# Patient Record
Sex: Male | Born: 1950 | Race: White | Hispanic: No | Marital: Married | Smoking: Former smoker
Health system: Southern US, Community
[De-identification: ages and names within clinical notes are randomized; demographics above are authoritative.]

## PROBLEM LIST (undated history)

## (undated) DIAGNOSIS — M21371 Foot drop, right foot: Secondary | ICD-10-CM

## (undated) DIAGNOSIS — Z992 Dependence on renal dialysis: Secondary | ICD-10-CM

## (undated) DIAGNOSIS — J90 Pleural effusion, not elsewhere classified: Secondary | ICD-10-CM

## (undated) DIAGNOSIS — R001 Bradycardia, unspecified: Secondary | ICD-10-CM

## (undated) DIAGNOSIS — D638 Anemia in other chronic diseases classified elsewhere: Secondary | ICD-10-CM

## (undated) DIAGNOSIS — I219 Acute myocardial infarction, unspecified: Secondary | ICD-10-CM

## (undated) DIAGNOSIS — H35053 Retinal neovascularization, unspecified, bilateral: Secondary | ICD-10-CM

## (undated) DIAGNOSIS — I5022 Chronic systolic (congestive) heart failure: Secondary | ICD-10-CM

## (undated) DIAGNOSIS — I255 Ischemic cardiomyopathy: Secondary | ICD-10-CM

## (undated) DIAGNOSIS — N186 End stage renal disease: Secondary | ICD-10-CM

## (undated) DIAGNOSIS — I2510 Atherosclerotic heart disease of native coronary artery without angina pectoris: Secondary | ICD-10-CM

## (undated) DIAGNOSIS — I10 Essential (primary) hypertension: Secondary | ICD-10-CM

## (undated) DIAGNOSIS — M21372 Foot drop, left foot: Secondary | ICD-10-CM

## (undated) DIAGNOSIS — E1142 Type 2 diabetes mellitus with diabetic polyneuropathy: Principal | ICD-10-CM

## (undated) DIAGNOSIS — R269 Unspecified abnormalities of gait and mobility: Secondary | ICD-10-CM

## (undated) DIAGNOSIS — G6181 Chronic inflammatory demyelinating polyneuritis: Secondary | ICD-10-CM

## (undated) DIAGNOSIS — E785 Hyperlipidemia, unspecified: Secondary | ICD-10-CM

## (undated) HISTORY — DX: Anemia in other chronic diseases classified elsewhere: D63.8

## (undated) HISTORY — DX: Unspecified abnormalities of gait and mobility: R26.9

## (undated) HISTORY — DX: Hypomagnesemia: E83.42

## (undated) HISTORY — DX: Dependence on renal dialysis: Z99.2

## (undated) HISTORY — DX: Chronic systolic (congestive) heart failure: I50.22

## (undated) HISTORY — DX: Atherosclerotic heart disease of native coronary artery without angina pectoris: I25.10

## (undated) HISTORY — DX: Essential (primary) hypertension: I10

## (undated) HISTORY — DX: Ischemic cardiomyopathy: I25.5

## (undated) HISTORY — DX: End stage renal disease: Z99.2

## (undated) HISTORY — DX: Chronic inflammatory demyelinating polyneuritis: G61.81

## (undated) HISTORY — DX: End stage renal disease: N18.6

## (undated) HISTORY — DX: Foot drop, right foot: M21.371

## (undated) HISTORY — DX: Hyperlipidemia, unspecified: E78.5

## (undated) HISTORY — DX: Type 2 diabetes mellitus with diabetic polyneuropathy: E11.42

## (undated) HISTORY — DX: Foot drop, left foot: M21.372

## (undated) HISTORY — DX: Bradycardia, unspecified: R00.1

## (undated) HISTORY — PX: TONSILLECTOMY: SUR1361

## (undated) HISTORY — DX: Retinal neovascularization, unspecified, bilateral: H35.053

## (undated) HISTORY — DX: Acute myocardial infarction, unspecified: I21.9

## (undated) HISTORY — PX: LITHOTRIPSY: SUR834

## (undated) HISTORY — DX: Pleural effusion, not elsewhere classified: J90

---

## 1993-08-16 DIAGNOSIS — I219 Acute myocardial infarction, unspecified: Secondary | ICD-10-CM

## 1993-08-16 HISTORY — DX: Acute myocardial infarction, unspecified: I21.9

## 1993-08-16 HISTORY — PX: CARDIAC CATHETERIZATION: SHX172

## 1993-08-16 HISTORY — PX: CORONARY ANGIOPLASTY WITH STENT PLACEMENT: SHX49

## 1998-07-14 ENCOUNTER — Ambulatory Visit (HOSPITAL_COMMUNITY): Admission: RE | Admit: 1998-07-14 | Discharge: 1998-07-14 | Payer: Self-pay | Admitting: Cardiology

## 1998-07-14 ENCOUNTER — Encounter: Payer: Self-pay | Admitting: Cardiology

## 1999-01-13 ENCOUNTER — Observation Stay (HOSPITAL_COMMUNITY): Admission: AD | Admit: 1999-01-13 | Discharge: 1999-01-14 | Payer: Self-pay | Admitting: Cardiology

## 1999-08-14 ENCOUNTER — Ambulatory Visit (HOSPITAL_COMMUNITY): Admission: RE | Admit: 1999-08-14 | Discharge: 1999-08-15 | Payer: Self-pay | Admitting: Cardiology

## 2000-01-29 ENCOUNTER — Ambulatory Visit (HOSPITAL_COMMUNITY): Admission: RE | Admit: 2000-01-29 | Discharge: 2000-01-29 | Payer: Self-pay | Admitting: Gastroenterology

## 2000-03-22 ENCOUNTER — Ambulatory Visit (HOSPITAL_COMMUNITY): Admission: RE | Admit: 2000-03-22 | Discharge: 2000-03-23 | Payer: Self-pay | Admitting: Cardiology

## 2001-03-22 ENCOUNTER — Encounter: Payer: Self-pay | Admitting: Cardiology

## 2001-03-22 ENCOUNTER — Ambulatory Visit (HOSPITAL_COMMUNITY): Admission: RE | Admit: 2001-03-22 | Discharge: 2001-03-22 | Payer: Self-pay | Admitting: Cardiology

## 2001-07-29 ENCOUNTER — Encounter: Admission: RE | Admit: 2001-07-29 | Discharge: 2001-07-29 | Payer: Self-pay | Admitting: Family Medicine

## 2001-07-29 ENCOUNTER — Encounter: Payer: Self-pay | Admitting: Family Medicine

## 2001-08-03 ENCOUNTER — Encounter: Payer: Self-pay | Admitting: Family Medicine

## 2001-08-03 ENCOUNTER — Ambulatory Visit (HOSPITAL_COMMUNITY): Admission: RE | Admit: 2001-08-03 | Discharge: 2001-08-03 | Payer: Self-pay | Admitting: Family Medicine

## 2002-03-23 ENCOUNTER — Encounter: Payer: Self-pay | Admitting: Cardiology

## 2002-03-23 ENCOUNTER — Ambulatory Visit (HOSPITAL_COMMUNITY): Admission: RE | Admit: 2002-03-23 | Discharge: 2002-03-23 | Payer: Self-pay | Admitting: Cardiology

## 2002-09-04 ENCOUNTER — Inpatient Hospital Stay (HOSPITAL_COMMUNITY): Admission: EM | Admit: 2002-09-04 | Discharge: 2002-09-11 | Payer: Self-pay | Admitting: Internal Medicine

## 2002-09-05 ENCOUNTER — Encounter: Payer: Self-pay | Admitting: Pulmonary Disease

## 2002-09-09 ENCOUNTER — Encounter: Payer: Self-pay | Admitting: Pulmonary Disease

## 2002-09-28 ENCOUNTER — Encounter
Admission: RE | Admit: 2002-09-28 | Discharge: 2002-09-28 | Payer: Self-pay | Admitting: Thoracic Surgery (Cardiothoracic Vascular Surgery)

## 2002-09-28 ENCOUNTER — Encounter: Payer: Self-pay | Admitting: Thoracic Surgery (Cardiothoracic Vascular Surgery)

## 2003-02-01 ENCOUNTER — Encounter: Payer: Self-pay | Admitting: Family Medicine

## 2003-02-01 ENCOUNTER — Encounter: Admission: RE | Admit: 2003-02-01 | Discharge: 2003-02-01 | Payer: Self-pay | Admitting: Family Medicine

## 2003-03-11 ENCOUNTER — Encounter (HOSPITAL_COMMUNITY): Admission: RE | Admit: 2003-03-11 | Discharge: 2003-06-09 | Payer: Self-pay | Admitting: Neurology

## 2003-06-12 ENCOUNTER — Encounter (HOSPITAL_COMMUNITY): Admission: RE | Admit: 2003-06-12 | Discharge: 2003-09-10 | Payer: Self-pay | Admitting: Neurology

## 2003-09-18 ENCOUNTER — Encounter (HOSPITAL_COMMUNITY): Admission: RE | Admit: 2003-09-18 | Discharge: 2003-12-17 | Payer: Self-pay | Admitting: Neurology

## 2003-12-17 ENCOUNTER — Encounter (HOSPITAL_COMMUNITY): Admission: RE | Admit: 2003-12-17 | Discharge: 2004-03-16 | Payer: Self-pay | Admitting: Neurology

## 2005-04-23 ENCOUNTER — Ambulatory Visit (HOSPITAL_COMMUNITY): Admission: RE | Admit: 2005-04-23 | Discharge: 2005-04-23 | Payer: Self-pay | Admitting: Cardiology

## 2005-04-23 ENCOUNTER — Encounter: Payer: Self-pay | Admitting: Cardiovascular Disease

## 2005-06-24 ENCOUNTER — Ambulatory Visit: Payer: Self-pay | Admitting: Family Medicine

## 2005-06-29 ENCOUNTER — Ambulatory Visit: Payer: Self-pay | Admitting: Family Medicine

## 2008-08-28 ENCOUNTER — Encounter: Payer: Self-pay | Admitting: Cardiovascular Disease

## 2008-09-13 ENCOUNTER — Encounter: Payer: Self-pay | Admitting: Cardiovascular Disease

## 2010-10-06 ENCOUNTER — Encounter: Payer: Self-pay | Admitting: Cardiovascular Disease

## 2010-10-06 ENCOUNTER — Inpatient Hospital Stay: Payer: PRIVATE HEALTH INSURANCE | Admitting: Internal Medicine

## 2010-10-14 ENCOUNTER — Ambulatory Visit: Payer: PRIVATE HEALTH INSURANCE | Admitting: Family Medicine

## 2010-10-17 ENCOUNTER — Encounter: Payer: Self-pay | Admitting: Cardiovascular Disease

## 2010-10-17 ENCOUNTER — Observation Stay: Payer: PRIVATE HEALTH INSURANCE | Admitting: Internal Medicine

## 2010-11-03 ENCOUNTER — Encounter: Payer: Self-pay | Admitting: Cardiovascular Disease

## 2010-11-11 ENCOUNTER — Ambulatory Visit: Payer: PRIVATE HEALTH INSURANCE | Admitting: Family Medicine

## 2010-11-23 ENCOUNTER — Ambulatory Visit: Payer: PRIVATE HEALTH INSURANCE | Admitting: Vascular Surgery

## 2010-12-22 ENCOUNTER — Encounter: Payer: Self-pay | Admitting: *Deleted

## 2010-12-22 ENCOUNTER — Ambulatory Visit (INDEPENDENT_AMBULATORY_CARE_PROVIDER_SITE_OTHER): Payer: Medicare Other | Admitting: Cardiovascular Disease

## 2010-12-22 ENCOUNTER — Ambulatory Visit: Payer: Self-pay | Admitting: Cardiovascular Disease

## 2010-12-22 ENCOUNTER — Encounter: Payer: Self-pay | Admitting: Cardiovascular Disease

## 2010-12-22 DIAGNOSIS — E119 Type 2 diabetes mellitus without complications: Secondary | ICD-10-CM

## 2010-12-22 DIAGNOSIS — E785 Hyperlipidemia, unspecified: Secondary | ICD-10-CM | POA: Insufficient documentation

## 2010-12-22 DIAGNOSIS — I10 Essential (primary) hypertension: Secondary | ICD-10-CM

## 2010-12-22 DIAGNOSIS — I2510 Atherosclerotic heart disease of native coronary artery without angina pectoris: Secondary | ICD-10-CM

## 2010-12-22 DIAGNOSIS — E1129 Type 2 diabetes mellitus with other diabetic kidney complication: Secondary | ICD-10-CM | POA: Insufficient documentation

## 2010-12-22 DIAGNOSIS — R0602 Shortness of breath: Secondary | ICD-10-CM

## 2010-12-22 NOTE — Assessment & Plan Note (Signed)
Etiology of his shortness of breath is uncertain. We have ordered a stress test.

## 2010-12-22 NOTE — Assessment & Plan Note (Signed)
He has had recent weight loss which would likely help his underlying diabetes. We have suggested he try to hold his weight at its current level.

## 2010-12-22 NOTE — Progress Notes (Signed)
   Patient ID: Daniel Anthony, male    DOB: 10/12/1950, 60 y.o.   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  Overall, he reports that he feels stable. He does have increasing shortness of breath over the past month that he feels is more than just deconditioning. He is interested in having a stress test to confirm that his stents are patent. At baseline he does not walk very far and when he does, he has significant leg weakness. He is not able to treadmill secondary to his underlying neurologic disease.  He does report that his weight has decreased significantly over the past several weeks. Because of this, his blood pressure has significantly improved. Typically his blood pressure is very elevated normal recently on his current regiment, it has been well controlled.  He was admitted to [facility] twice this year for medication titration and blood pressure control.  EKG shows normal sinus rhythm with rate 71 beats per minute with T wave abnormality in leads V3 through V6, one and aVL     Review of Systems  Constitutional: Negative.   HENT: Negative.   Eyes: Negative.   Respiratory: Positive for shortness of breath.   Cardiovascular: Negative.   Gastrointestinal: Negative.   Musculoskeletal:       Profound lower extremity muscle weakness, muscle atrophy in his hands  Skin: Negative.   Neurological: Negative.   Hematological: Negative.   Psychiatric/Behavioral: Negative.   All other  systems reviewed and are negative.    BP 118/64  Pulse 71  Ht 5\' 8"  (1.727 m)  Wt 170 lb (77.111 kg)  BMI 25.85 kg/m2   Physical Exam  Nursing note and vitals reviewed. Constitutional: He is oriented to person, place, and time. He appears well-developed and well-nourished.       She does have signs of muscle wasting thickening in his hands, thin legs  HENT:  Head: Normocephalic.  Nose: Nose normal.  Mouth/Throat: Oropharynx is clear and moist.  Eyes: Conjunctivae are normal. Pupils are equal, round, and reactive to light.  Neck: Normal range of motion. Neck supple. No JVD present.  Cardiovascular: Normal rate, regular rhythm, S1 normal, S2 normal, normal heart sounds and intact distal pulses.  Exam reveals no gallop and no friction rub.   No murmur heard. Pulmonary/Chest: Effort normal and breath sounds normal. No respiratory distress. He has no wheezes. He has no rales. He exhibits no tenderness.  Abdominal: Soft. Bowel sounds are normal. He exhibits no distension. There is no tenderness.  Musculoskeletal: Normal range of motion. He exhibits no edema and no tenderness.  Lymphadenopathy:    He has no cervical adenopathy.  Neurological: He is alert and oriented to person, place, and time. Coordination normal.  Skin: Skin is warm and dry. No rash noted. No erythema.  Psychiatric: He has a normal mood and affect. His behavior is normal. Judgment and thought content normal.           Assessment and Plan

## 2010-12-22 NOTE — Assessment & Plan Note (Signed)
History of significant coronary artery disease. He does have worsening shortness of breath he is concerned about progression of his disease. We will set him up for a lexiscan Myoview.

## 2010-12-22 NOTE — Assessment & Plan Note (Signed)
Cholesterol is at goal on the current lipid regimen. No changes to the medications were made.  

## 2010-12-22 NOTE — Patient Instructions (Addendum)
You are doing well. Please monitor your blood pressure and cut the hydralazine in 1/2 if SBP is less than 120. Please call us if you have new issues that need to be addressed before your next appt.  We will call you for a follow up Appt. In 6 months You have been scheduled for a myoview at [facility]. We will call you with results.

## 2010-12-22 NOTE — Assessment & Plan Note (Signed)
Blood pressure is excellent and even running a little bit low. We have suggested that he monitor his pressure and if it does run low, he cut his hydralazine in half to 25 mg q.i.d..

## 2011-01-01 ENCOUNTER — Telehealth: Payer: Self-pay | Admitting: *Deleted

## 2011-01-01 ENCOUNTER — Ambulatory Visit: Payer: PRIVATE HEALTH INSURANCE | Admitting: Cardiovascular Disease

## 2011-01-01 DIAGNOSIS — R079 Chest pain, unspecified: Secondary | ICD-10-CM

## 2011-01-01 NOTE — Telephone Encounter (Signed)
Attempted to call pt with results of lexiscan. Per Dr. Mariah Milling, overall normal test, possible inferior wall artifact/ or thinning of wall/ wall motion good though. Will attempt to call pt at later time.

## 2011-01-04 NOTE — Telephone Encounter (Signed)
Spoke to Daniel Anthony, notified him of results below. Daniel Anthony did want Dr. Mariah Milling to know that his DOE has increased, but Dr. Sullivan Lone will be coming by his house to assess, and he will f/u with Dr. Sullivan Lone for that, just FYI.

## 2011-01-22 ENCOUNTER — Ambulatory Visit: Payer: PRIVATE HEALTH INSURANCE | Admitting: Family Medicine

## 2011-01-25 ENCOUNTER — Encounter: Payer: Self-pay | Admitting: Cardiovascular Disease

## 2011-03-04 ENCOUNTER — Encounter: Payer: Self-pay | Admitting: Cardiovascular Disease

## 2011-04-02 ENCOUNTER — Ambulatory Visit: Payer: PRIVATE HEALTH INSURANCE | Admitting: Family Medicine

## 2011-08-27 ENCOUNTER — Encounter: Payer: Self-pay | Admitting: Cardiovascular Disease

## 2011-08-27 ENCOUNTER — Ambulatory Visit (INDEPENDENT_AMBULATORY_CARE_PROVIDER_SITE_OTHER): Payer: Medicare Other | Admitting: Cardiovascular Disease

## 2011-08-27 VITALS — BP 124/62 | HR 63 | Ht 68.0 in | Wt 181.8 lb

## 2011-08-27 DIAGNOSIS — I2510 Atherosclerotic heart disease of native coronary artery without angina pectoris: Secondary | ICD-10-CM

## 2011-08-27 DIAGNOSIS — E785 Hyperlipidemia, unspecified: Secondary | ICD-10-CM

## 2011-08-27 DIAGNOSIS — R0602 Shortness of breath: Secondary | ICD-10-CM

## 2011-08-27 DIAGNOSIS — I10 Essential (primary) hypertension: Secondary | ICD-10-CM

## 2011-08-27 NOTE — Progress Notes (Signed)
Patient ID: Daniel Anthony, male    DOB: 1951/05/10, 61 y.o.   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 Stress test was done after his last clinic visit that showed no significant ischemia.  He reports that overall he has been doing well. He has been seen by Dr. Otho Bellows in [place] for renal dysfunction. He was started on Lasix now taking 80 mg daily. He reports that his creatinine climbed and is now around 2.3.  His carvedilol was recently increased to 25 mg b.i.d.. With his current medications, he reports his blood pressure is well-controlled.  He does report occasional episodes of bilateral neck pain. One episode occurred after chasing his dog before Christmas.  He is not very active and has no regular activity or exercise. He does have some shortness of breath with moving the trash to the curb  Echocardiogram February 2012 was essentially normal. There was no mention on whether there was diastolic dysfunction. Study was read by the outside cardiologist  EKG shows normal sinus rhythm with rate 63 beats per minute with no Significant ST or T wave changes   Outpatient Encounter Prescriptions as of 08/27/2011  Medication Sig Dispense Refill  . amLODipine (NORVASC) 5 MG tablet Take 5 mg by mouth daily.        Marland Kitchen aspirin 325 MG EC tablet Take 325 mg by mouth daily.        Marland Kitchen atorvastatin (LIPITOR) 40 MG tablet Take 40 mg by mouth daily.        . carvedilol (COREG) 25 MG tablet Take 25 mg by mouth 2 (two)  times daily with a meal.      . docusate sodium (COLACE) 100 MG capsule Take 100 mg by mouth 2 (two) times daily.        Marland Kitchen escitalopram (LEXAPRO) 10 MG tablet Take 10 mg by mouth daily.        . fentaNYL (DURAGESIC - DOSED MCG/HR) 100 MCG/HR Place 1 patch onto the skin every 3 (three) days.        . furosemide (LASIX) 80 MG tablet Take 80 mg by mouth daily.      . hydrALAZINE (APRESOLINE) 50 MG tablet Take 50 mg by mouth 2 (two) times daily.       . insulin detemir (LEVEMIR) 100 UNIT/ML injection Inject 20 Units into the skin at bedtime.        . insulin lispro protamine-insulin lispro (HUMALOG 75/25) (75-25) 100 UNIT/ML SUSP Inject 30 Units into the skin daily with breakfast.        . sitaGLIPtan (JANUVIA) 100 MG tablet Take 100 mg by mouth daily.       Marland Kitchen telmisartan (MICARDIS) 80 MG tablet Take 80 mg by mouth daily.        Review of Systems  Constitutional: Negative.   HENT: Negative.   Eyes: Negative.   Respiratory: Positive for shortness of breath.   Cardiovascular: Negative.        Bilateral neck pain  Gastrointestinal: Negative.   Musculoskeletal:  Profound lower extremity muscle weakness, muscle atrophy in his hands  Skin: Negative.   Neurological: Negative.   Hematological: Negative.   Psychiatric/Behavioral: Negative.   All other systems reviewed and are negative.    BP 124/62  Pulse 63  Ht 5\' 8"  (1.727 m)  Wt 181 lb 12 oz (82.441 kg)  BMI 27.63 kg/m2   Physical Exam  Nursing note and vitals reviewed. Constitutional: He is oriented to person, place, and time. He appears well-developed and well-nourished.       She does have signs of muscle wasting thickening in his hands, thin legs  HENT:  Head: Normocephalic.  Nose: Nose normal.  Mouth/Throat: Oropharynx is clear and moist.  Eyes: Conjunctivae are normal. Pupils are equal, round, and reactive to light.  Neck: Normal range of motion. Neck supple. No JVD present.  Cardiovascular: Normal rate, regular  rhythm, S1 normal, S2 normal, normal heart sounds and intact distal pulses.  Exam reveals no gallop and no friction rub.   No murmur heard. Pulmonary/Chest: Effort normal and breath sounds normal. No respiratory distress. He has no wheezes. He has no rales. He exhibits no tenderness.  Abdominal: Soft. Bowel sounds are normal. He exhibits no distension. There is no tenderness.  Musculoskeletal: Normal range of motion. He exhibits no edema and no tenderness.  Lymphadenopathy:    He has no cervical adenopathy.  Neurological: He is alert and oriented to person, place, and time. Coordination normal.  Skin: Skin is warm and dry. No rash noted. No erythema.  Psychiatric: He has a normal mood and affect. His behavior is normal. Judgment and thought content normal.           Assessment and Plan

## 2011-08-27 NOTE — Patient Instructions (Signed)
You are doing well. No medication changes were made.  Please call us if you have new issues that need to be addressed before your next appt.  Your physician wants you to follow-up in: 6 months.  You will receive a reminder letter in the mail two months in advance. If you don't receive a letter, please call our office to schedule the follow-up appointment.   

## 2011-08-27 NOTE — Assessment & Plan Note (Signed)
Blood pressure is well controlled on today's visit. He has had a climbing his creatinine with high-dose Lasix. I suggested he consider cutting back on his Lasix dosing. Latest creatinine 2.3

## 2011-08-27 NOTE — Assessment & Plan Note (Signed)
Cholesterol is at goal on the current lipid regimen. No changes to the medications were made.  

## 2011-08-27 NOTE — Assessment & Plan Note (Signed)
I suspect his shortness of breath could be secondary to deconditioning. Unable to exclude underlying cardiac ischemia. If his symptoms get worse, we would perform a catheterization. Echocardiogram did not mention diastolic dysfunction in 2012

## 2011-08-27 NOTE — Assessment & Plan Note (Signed)
Recent rare episodes of bilateral neck pain with exertion. He is uncertain if this is cardiac or some other etiology. We will plan to proceed monitor him and he will call if he has additional or worsening symptoms. If this happens, we would consider a cardiac catheterization.

## 2011-11-11 ENCOUNTER — Ambulatory Visit: Payer: Self-pay | Admitting: Family Medicine

## 2011-12-17 ENCOUNTER — Encounter: Payer: Self-pay | Admitting: *Deleted

## 2011-12-23 ENCOUNTER — Ambulatory Visit (INDEPENDENT_AMBULATORY_CARE_PROVIDER_SITE_OTHER): Payer: Medicare Other | Admitting: Cardiovascular Disease

## 2011-12-23 ENCOUNTER — Telehealth: Payer: Self-pay | Admitting: Cardiovascular Disease

## 2011-12-23 ENCOUNTER — Encounter: Payer: Self-pay | Admitting: Cardiovascular Disease

## 2011-12-23 VITALS — BP 124/64 | HR 61 | Ht 68.0 in | Wt 185.5 lb

## 2011-12-23 DIAGNOSIS — E119 Type 2 diabetes mellitus without complications: Secondary | ICD-10-CM

## 2011-12-23 DIAGNOSIS — R29898 Other symptoms and signs involving the musculoskeletal system: Secondary | ICD-10-CM

## 2011-12-23 DIAGNOSIS — E785 Hyperlipidemia, unspecified: Secondary | ICD-10-CM

## 2011-12-23 DIAGNOSIS — R0989 Other specified symptoms and signs involving the circulatory and respiratory systems: Secondary | ICD-10-CM

## 2011-12-23 DIAGNOSIS — I2510 Atherosclerotic heart disease of native coronary artery without angina pectoris: Secondary | ICD-10-CM

## 2011-12-23 DIAGNOSIS — R0602 Shortness of breath: Secondary | ICD-10-CM

## 2011-12-23 DIAGNOSIS — R6889 Other general symptoms and signs: Secondary | ICD-10-CM

## 2011-12-23 DIAGNOSIS — M5382 Other specified dorsopathies, cervical region: Secondary | ICD-10-CM

## 2011-12-23 DIAGNOSIS — I10 Essential (primary) hypertension: Secondary | ICD-10-CM

## 2011-12-23 MED ORDER — NITROGLYCERIN 0.4 MG SL SUBL: 0.4000 mg | SUBLINGUAL_TABLET | SUBLINGUAL | Status: DC | PRN

## 2011-12-23 NOTE — Progress Notes (Signed)
Patient ID: Daniel Anthony, male    DOB: 01/11/1951, 61 y.o.   MRN: [id number]  HPI Comments: 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 Stress test was done early 2012 that showed no significant ischemia.  He reports that overall he has been doing well. He has been seen by Dr. Otho Bellows in [place] for renal dysfunction. Creatinine has improved, now less than 2. blood pressure is well-controlled.  On previous office visit, He reported occasional episodes of bilateral neck pain. He is not very active and has no regular activity or exercise. He reports having some tightness in his throat recently when moving trash cans. This occurred several weeks ago, 4-5 episodes during that week. It has not happened since then. He is concerned about worsening  coronary artery disease.  Echocardiogram February 2012 was essentially normal. There was no mention on whether there was diastolic dysfunction. Study was read by the outside cardiologist  EKG shows normal sinus rhythm with rate 63 beats per minute with T-wave abnormality in V6, aVL  Outpatient Encounter Prescriptions as of 12/23/2011  Medication Sig Dispense Refill  . amLODipine (NORVASC) 5 MG tablet Take 5 mg by mouth daily.        Marland Kitchen aspirin 325 MG EC tablet Take 325 mg by mouth daily.        Marland Kitchen atorvastatin (LIPITOR) 40 MG tablet Take 40 mg by mouth daily.        . Calcium Citrate (CITRACAL PO) Take 0.25 mg by mouth daily. Take one daily  alternating with two daily every other day.      . carvedilol (COREG) 25 MG tablet Take 12.5 mg by mouth. Taking one and 1/2 two times daily.      Marland Kitchen docusate sodium (COLACE) 100 MG capsule Take 250 mg by mouth daily.       Marland Kitchen escitalopram (LEXAPRO) 10 MG tablet Take 10 mg by mouth daily.        . fentaNYL (DURAGESIC - DOSED MCG/HR) 100 MCG/HR Place 1 patch onto the skin every 3 (three) days.        . furosemide (LASIX) 80 MG tablet Take 80 mg by mouth daily.      . hydrALAZINE (APRESOLINE) 50 MG tablet Take 50 mg by mouth 2 (two) times daily.       . insulin detemir (LEVEMIR) 100 UNIT/ML injection Inject 50 Units into the skin at bedtime.       Marland Kitchen telmisartan (MICARDIS) 80 MG tablet Take 80 mg by mouth daily.      . vitamin B-12 (CYANOCOBALAMIN) 100 MCG tablet Take 50 mcg by mouth every 30 (thirty) days.      . nitroGLYCERIN (NITROSTAT) 0.4 MG SL tablet Place 1 tablet (0.4 mg total) under the tongue every 5 (five) minutes as needed for chest pain.  25 tablet  3   Review of Systems  Constitutional: Negative.   HENT: Negative.   Eyes: Negative.   Respiratory: Positive for shortness of breath.   Cardiovascular: Negative.  Throat tightness  Gastrointestinal: Negative.   Musculoskeletal:       Profound lower extremity muscle weakness, muscle atrophy in his hands  Skin: Negative.   Neurological: Negative.   Hematological: Negative.   Psychiatric/Behavioral: Negative.   All other systems reviewed and are negative.    BP 124/64  Pulse 61  Ht 5\' 8"  (1.727 m)  Wt 185 lb 8 oz (84.142 kg)  BMI 28.21 kg/m2  Physical Exam  Nursing note and vitals reviewed. Constitutional: He is oriented to person, place, and time. He appears well-developed and well-nourished.        muscle wasting thickening in his hands, thin legs  HENT:  Head: Normocephalic.  Nose: Nose normal.  Mouth/Throat: Oropharynx is clear and moist.  Eyes: Conjunctivae are normal. Pupils are equal, round, and reactive to  light.  Neck: Normal range of motion. Neck supple. No JVD present.  Cardiovascular: Normal rate, regular rhythm, S1 normal, S2 normal, normal heart sounds and intact distal pulses.  Exam reveals no gallop and no friction rub.   No murmur heard. Pulmonary/Chest: Effort normal and breath sounds normal. No respiratory distress. He has no wheezes. He has no rales. He exhibits no tenderness.  Abdominal: Soft. Bowel sounds are normal. He exhibits no distension. There is no tenderness.  Musculoskeletal: Normal range of motion. He exhibits no edema and no tenderness.  Lymphadenopathy:    He has no cervical adenopathy.  Neurological: He is alert and oriented to person, place, and time. Coordination normal.  Skin: Skin is warm and dry. No rash noted. No erythema.  Psychiatric: He has a normal mood and affect. His behavior is normal. Judgment and thought content normal.           Assessment and Plan

## 2011-12-23 NOTE — Assessment & Plan Note (Signed)
We have encouraged continued exercise, careful diet management in an effort to lose weight. 

## 2011-12-23 NOTE — Assessment & Plan Note (Signed)
Recent possible angina, uncertain. He did have chest pain in the past now with throat tightness symptoms that have resolved. We have suggested he call us for any further symptoms and we would proceed with stress testing.

## 2011-12-23 NOTE — Assessment & Plan Note (Signed)
Etiology of his episodes of throat tightness is uncertain. He has had neck pain in the past. No recent symptoms over the past several weeks. Unable to exclude angina. No significant EKG changes. We have discussed the options with him including Myoview or Monitoring his symptoms. He will call us if he starts to have symptoms again and we would order a pharmacologic Myoview. We have renewed his nitroglycerin.

## 2011-12-23 NOTE — Patient Instructions (Signed)
You are doing well. No medication changes were made.  Please call the office for any further throat or chest symptoms We would order a chemical stress test  Please call us if you have new issues that need to be addressed before your next appt.  Your physician wants you to follow-up in: 6 months.  You will receive a reminder letter in the mail two months in advance. If you don't receive a letter, please call our office to schedule the follow-up appointment.

## 2011-12-23 NOTE — Telephone Encounter (Signed)
Pt was given script for nitro and pharmacy called due to pt getting lavitra from another provider and wanted to make sure this is ok.

## 2011-12-23 NOTE — Telephone Encounter (Signed)
Please advise. I can tell patient not to take NTG when taking the levitra or what do you recommend?

## 2011-12-23 NOTE — Assessment & Plan Note (Signed)
He reports LDL in the 70s. We'll try to obtain most recent cholesterol for our records.

## 2011-12-23 NOTE — Assessment & Plan Note (Signed)
Chronic issue, mild. History of smoking, deconditioned also an underlying neurologic issue.

## 2011-12-23 NOTE — Assessment & Plan Note (Signed)
Blood pressure is well controlled on today's visit. No changes made to the medications. 

## 2011-12-23 NOTE — Telephone Encounter (Signed)
Would recommended if he takes nitroglycerin consistently, that he hold the Levitra

## 2011-12-24 NOTE — Telephone Encounter (Signed)
NA x 1

## 2011-12-24 NOTE — Telephone Encounter (Signed)
Will forward to Vincent. I tried to call the patient several times with Dr. Windell Hummingbird recommendations. I keep getting a fast busy signal at his home #.

## 2011-12-31 NOTE — Telephone Encounter (Signed)
Patient not available; will send a letter regarding this message.

## 2012-01-03 NOTE — Telephone Encounter (Signed)
Letter sent on Jan 03, 2012.

## 2012-01-28 ENCOUNTER — Other Ambulatory Visit: Payer: Self-pay | Admitting: Gastroenterology

## 2012-05-15 ENCOUNTER — Ambulatory Visit: Payer: Self-pay | Admitting: Family Medicine

## 2012-06-20 ENCOUNTER — Ambulatory Visit (INDEPENDENT_AMBULATORY_CARE_PROVIDER_SITE_OTHER): Payer: Medicare Other | Admitting: Cardiovascular Disease

## 2012-06-20 ENCOUNTER — Encounter: Payer: Self-pay | Admitting: Cardiovascular Disease

## 2012-06-20 VITALS — BP 152/78 | HR 72 | Ht 68.0 in | Wt 180.2 lb

## 2012-06-20 DIAGNOSIS — E119 Type 2 diabetes mellitus without complications: Secondary | ICD-10-CM

## 2012-06-20 DIAGNOSIS — I10 Essential (primary) hypertension: Secondary | ICD-10-CM

## 2012-06-20 DIAGNOSIS — I2510 Atherosclerotic heart disease of native coronary artery without angina pectoris: Secondary | ICD-10-CM

## 2012-06-20 DIAGNOSIS — E785 Hyperlipidemia, unspecified: Secondary | ICD-10-CM

## 2012-06-20 MED ORDER — EZETIMIBE 10 MG PO TABS: 10.0000 mg | ORAL_TABLET | Freq: Every day | ORAL | Status: DC

## 2012-06-20 MED ORDER — ROSUVASTATIN CALCIUM 40 MG PO TABS: 40.0000 mg | ORAL_TABLET | Freq: Every day | ORAL | Status: DC

## 2012-06-20 NOTE — Assessment & Plan Note (Signed)
We have encouraged continued exercise, careful diet management in an effort to lose weight. 

## 2012-06-20 NOTE — Patient Instructions (Addendum)
You are doing well. Start zetia one pill a day Consider changing lipitor to crestor one a day  Talk with Renal about blood pressure meds: Imdur/isosorbide, long acting nitro Hydralazine 100 mg pills Doxazosin evening Extra lasix, or add HCTZ BIG ?   Add ACE inh   Please call us if you have new issues that need to be addressed before your next appt.  Your physician wants you to follow-up in: 6 months.  You will receive a reminder letter in the mail two months in advance. If you don't receive a letter, please call our office to schedule the follow-up appointment.

## 2012-06-20 NOTE — Progress Notes (Signed)
Patient ID: Daniel Anthony, male    DOB: [birth year], 61 y.o.   MRN: [id number]  HPI Comments: 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 Stress test was done early 2012 that showed no significant ischemia.  He reports that overall he has been doing well. He sees Dr. Otho Bellows in [place] for renal dysfunction.  Blood pressure has been running high at home typically in the 150-170 range systolic.   He is not very active and has no regular activity or exercise. No significant chest pain or worsening of his shortness of breath  Echocardiogram February 2012 was essentially normal. There was no mention on whether there was diastolic dysfunction. Study was read by the outside cardiologist  Recent laboratory total cholesterol 190, LDL 117, HDL 44  EKG shows normal sinus rhythm with rate 72 beats per minute with no significant ST or T wave changes  Outpatient Encounter Prescriptions as of 06/20/2012  Medication Sig Dispense Refill  . amLODipine (NORVASC) 5 MG tablet Take 5 mg by mouth daily.        Marland Kitchen aspirin 325 MG EC tablet Take 325 mg by mouth daily.        . Calcium Citrate (CITRACAL PO) Take 0.25 mg by mouth 2 (two) times daily.       . carvedilol (COREG) 25 MG tablet Taking one and 1/2 two times daily.      Marland Kitchen docusate sodium (COLACE) 250 MG capsule Take 250 mg by mouth daily.      Marland Kitchen escitalopram (LEXAPRO) 10 MG tablet Take 10 mg by mouth daily.        . fentaNYL (DURAGESIC - DOSED  MCG/HR) 100 MCG/HR Place 1 patch onto the skin every 3 (three) days.        . furosemide (LASIX) 80 MG tablet Take 80 mg by mouth daily.      . hydrALAZINE (APRESOLINE) 50 MG tablet Take 50 mg by mouth 2 (two) times daily.       . insulin detemir (LEVEMIR) 100 UNIT/ML injection Inject 50 Units into the skin at bedtime.       . nitroGLYCERIN (NITROSTAT) 0.4 MG SL tablet Place 1 tablet (0.4 mg total) under the tongue every 5 (five) minutes as needed for chest pain.  25 tablet  3  . telmisartan (MICARDIS) 80 MG tablet Take 80 mg by mouth daily.      . vitamin B-12 (CYANOCOBALAMIN) 100 MCG tablet Take 50 mcg by mouth every 30 (thirty) days.      Marland Kitchen  atorvastatin (LIPITOR) 40 MG tablet Take 40 mg by mouth daily.          Review of Systems  Constitutional: Negative.   HENT: Negative.   Eyes: Negative.   Respiratory: Positive for shortness of breath.   Cardiovascular: Negative.   Gastrointestinal: Negative.   Musculoskeletal:       Profound lower extremity muscle weakness, muscle atrophy in his hands  Skin: Negative.   Neurological: Negative.   Hematological: Negative.   Psychiatric/Behavioral: Negative.   All other  systems reviewed and are negative.    BP 152/78  Pulse 72  Ht 5\' 8"  (1.727 m)  Wt 180 lb 4 oz (81.761 kg)  BMI 27.41 kg/m2  Physical Exam  Nursing note and vitals reviewed. Constitutional: He is oriented to person, place, and time. He appears well-developed and well-nourished.        muscle wasting  in his hands, thin legs  HENT:  Head: Normocephalic.  Nose: Nose normal.  Mouth/Throat: Oropharynx is clear and moist.  Eyes: Conjunctivae normal are normal. Pupils are equal, round, and reactive to light.  Neck: Normal range of motion. Neck supple. No JVD present.  Cardiovascular: Normal rate, regular rhythm, S1 normal, S2 normal, normal heart sounds and intact distal pulses.  Exam reveals no gallop and no friction rub.   No murmur heard. Pulmonary/Chest: Effort normal  and breath sounds normal. No respiratory distress. He has no wheezes. He has no rales. He exhibits no tenderness.  Abdominal: Soft. Bowel sounds are normal. He exhibits no distension. There is no tenderness.  Musculoskeletal: Normal range of motion. He exhibits edema. He exhibits no tenderness.  Lymphadenopathy:    He has no cervical adenopathy.  Neurological: He is alert and oriented to person, place, and time. Coordination normal.  Skin: Skin is warm and dry. No rash noted. No erythema.  Psychiatric: He has a normal mood and affect. His behavior is normal. Judgment and thought content normal.           Assessment and Plan

## 2012-06-20 NOTE — Assessment & Plan Note (Signed)
Cholesterol not at goal. We will zetia 10 mg daily. Also consider changing Lipitor 40 mg to Crestor 40 mg.

## 2012-06-20 NOTE — Assessment & Plan Note (Signed)
Currently with no symptoms of angina. No further workup at this time. Continue current medication regimen. 

## 2012-06-20 NOTE — Assessment & Plan Note (Signed)
Talk with Renal about blood pressure meds: Imdur/isosorbide, long acting nitro Hydralazine 100 mg pills Doxazosin evening Extra lasix, or add HCTZ

## 2012-06-22 ENCOUNTER — Ambulatory Visit: Payer: Self-pay | Admitting: Family Medicine

## 2012-08-14 ENCOUNTER — Other Ambulatory Visit (HOSPITAL_COMMUNITY): Payer: Self-pay | Admitting: *Deleted

## 2012-08-15 ENCOUNTER — Encounter (HOSPITAL_COMMUNITY)
Admission: RE | Admit: 2012-08-15 | Discharge: 2012-08-15 | Disposition: A | Discharge status: Home / Self Care | Payer: Medicare Other | Source: Ambulatory Visit | Attending: Nephrology | Admitting: Nephrology

## 2012-08-15 DIAGNOSIS — N189 Chronic kidney disease, unspecified: Secondary | ICD-10-CM | POA: Insufficient documentation

## 2012-08-15 MED ORDER — EPOETIN ALFA 20000 UNIT/ML IJ SOLN
INTRAMUSCULAR | Status: AC
  Filled 2012-08-15: qty 1

## 2012-08-15 MED ORDER — SODIUM CHLORIDE 0.9 % IV SOLN
INTRAVENOUS | Status: DC
  Administered 2012-08-15: 10:00:00 via INTRAVENOUS

## 2012-08-15 MED ORDER — FERUMOXYTOL INJECTION 510 MG/17 ML
INTRAVENOUS | Status: AC
  Administered 2012-08-15: 510 mg
  Filled 2012-08-15: qty 17

## 2012-08-15 MED ORDER — EPOETIN ALFA 20000 UNIT/ML IJ SOLN
20000.0000 [IU] | INTRAMUSCULAR | Status: DC
  Administered 2012-08-15: 20000 [IU] via SUBCUTANEOUS

## 2012-08-29 ENCOUNTER — Encounter (HOSPITAL_COMMUNITY)
Admission: RE | Admit: 2012-08-29 | Discharge: 2012-08-29 | Disposition: A | Discharge status: Home / Self Care | Payer: Medicare Other | Source: Ambulatory Visit | Attending: Nephrology | Admitting: Nephrology

## 2012-08-29 DIAGNOSIS — N186 End stage renal disease: Secondary | ICD-10-CM | POA: Insufficient documentation

## 2012-08-29 DIAGNOSIS — Z5181 Encounter for therapeutic drug level monitoring: Secondary | ICD-10-CM | POA: Insufficient documentation

## 2012-08-29 DIAGNOSIS — E119 Type 2 diabetes mellitus without complications: Secondary | ICD-10-CM | POA: Insufficient documentation

## 2012-08-29 DIAGNOSIS — I120 Hypertensive chronic kidney disease with stage 5 chronic kidney disease or end stage renal disease: Secondary | ICD-10-CM | POA: Insufficient documentation

## 2012-08-29 MED ORDER — EPOETIN ALFA 20000 UNIT/ML IJ SOLN
20000.0000 [IU] | INTRAMUSCULAR | Status: DC
  Administered 2012-08-29: 20000 [IU] via SUBCUTANEOUS

## 2012-08-29 MED ORDER — EPOETIN ALFA 20000 UNIT/ML IJ SOLN
INTRAMUSCULAR | Status: AC
  Filled 2012-08-29: qty 1

## 2012-09-04 ENCOUNTER — Encounter (HOSPITAL_COMMUNITY): Payer: Self-pay

## 2012-09-04 ENCOUNTER — Ambulatory Visit (HOSPITAL_COMMUNITY)
Admission: RE | Admit: 2012-09-04 | Discharge: 2012-09-04 | Disposition: A | Discharge status: Home / Self Care | Payer: Medicare Other | Source: Ambulatory Visit | Attending: Nephrology | Admitting: Nephrology

## 2012-09-04 DIAGNOSIS — N184 Chronic kidney disease, stage 4 (severe): Secondary | ICD-10-CM | POA: Insufficient documentation

## 2012-09-04 DIAGNOSIS — I129 Hypertensive chronic kidney disease with stage 1 through stage 4 chronic kidney disease, or unspecified chronic kidney disease: Secondary | ICD-10-CM | POA: Insufficient documentation

## 2012-09-04 LAB — GLUCOSE, CAPILLARY: Glucose-Capillary: 165 mg/dL — ABNORMAL HIGH (ref 70–99)

## 2012-09-04 MED ORDER — SODIUM CHLORIDE 0.9 % IV SOLN
INTRAVENOUS | Status: AC
  Administered 2012-09-04 (×2): via INTRAVENOUS

## 2012-09-12 ENCOUNTER — Encounter (HOSPITAL_COMMUNITY)
Admission: RE | Admit: 2012-09-12 | Discharge: 2012-09-12 | Disposition: A | Discharge status: Home / Self Care | Payer: Medicare Other | Source: Ambulatory Visit | Attending: Nephrology | Admitting: Nephrology

## 2012-09-12 LAB — RENAL FUNCTION PANEL
Albumin: 3.3 g/dL — ABNORMAL LOW (ref 3.5–5.2)
BUN: 50 mg/dL — ABNORMAL HIGH (ref 6–23)
Creatinine, Ser: 3.3 mg/dL — ABNORMAL HIGH (ref 0.50–1.35)
Phosphorus: 4.7 mg/dL — ABNORMAL HIGH (ref 2.3–4.6)

## 2012-09-12 LAB — IRON AND TIBC
Iron: 53 ug/dL (ref 42–135)
Saturation Ratios: 19 % — ABNORMAL LOW (ref 20–55)
TIBC: 279 ug/dL (ref 215–435)
UIBC: 226 ug/dL (ref 125–400)

## 2012-09-12 LAB — CBC
HCT: 34 % — ABNORMAL LOW (ref 39.0–52.0)
MCH: 29.9 pg (ref 26.0–34.0)
MCV: 91.6 fL (ref 78.0–100.0)
Platelets: 224 10*3/uL (ref 150–400)
RBC: 3.71 MIL/uL — ABNORMAL LOW (ref 4.22–5.81)
RDW: 14.1 % (ref 11.5–15.5)

## 2012-09-12 MED ORDER — EPOETIN ALFA 20000 UNIT/ML IJ SOLN
20000.0000 [IU] | INTRAMUSCULAR | Status: DC
  Administered 2012-09-12: 20000 [IU] via SUBCUTANEOUS

## 2012-09-12 MED ORDER — EPOETIN ALFA 20000 UNIT/ML IJ SOLN
INTRAMUSCULAR | Status: AC
  Administered 2012-09-12: 20000 [IU] via SUBCUTANEOUS
  Filled 2012-09-12: qty 1

## 2012-09-26 ENCOUNTER — Encounter (HOSPITAL_COMMUNITY)
Admission: RE | Admit: 2012-09-26 | Discharge: 2012-09-26 | Disposition: A | Discharge status: Home / Self Care | Payer: Medicare Other | Source: Ambulatory Visit | Attending: Nephrology | Admitting: Nephrology

## 2012-09-26 DIAGNOSIS — N189 Chronic kidney disease, unspecified: Secondary | ICD-10-CM | POA: Insufficient documentation

## 2012-09-26 LAB — POCT HEMOGLOBIN-HEMACUE: Hemoglobin: 12.9 g/dL — ABNORMAL LOW (ref 13.0–17.0)

## 2012-09-26 MED ORDER — EPOETIN ALFA 20000 UNIT/ML IJ SOLN: 20000.0000 [IU] | INTRAMUSCULAR | Status: DC

## 2012-09-30 ENCOUNTER — Other Ambulatory Visit: Payer: Self-pay

## 2012-10-09 ENCOUNTER — Other Ambulatory Visit (HOSPITAL_COMMUNITY): Payer: Self-pay | Admitting: *Deleted

## 2012-10-10 ENCOUNTER — Encounter (HOSPITAL_COMMUNITY)
Admission: RE | Admit: 2012-10-10 | Discharge: 2012-10-10 | Disposition: A | Discharge status: Home / Self Care | Payer: Medicare Other | Source: Ambulatory Visit | Attending: Nephrology | Admitting: Nephrology

## 2012-10-10 LAB — IRON AND TIBC: Saturation Ratios: 24 % (ref 20–55)

## 2012-10-10 LAB — POCT HEMOGLOBIN-HEMACUE: Hemoglobin: 12.9 g/dL — ABNORMAL LOW (ref 13.0–17.0)

## 2012-10-10 MED ORDER — FERUMOXYTOL INJECTION 510 MG/17 ML
510.0000 mg | Freq: Once | INTRAVENOUS | Status: AC
  Administered 2012-10-10: 510 mg via INTRAVENOUS

## 2012-10-10 MED ORDER — SODIUM CHLORIDE 0.9 % IV SOLN
Freq: Once | INTRAVENOUS | Status: AC
  Administered 2012-10-10: 09:00:00 via INTRAVENOUS

## 2012-10-10 MED ORDER — EPOETIN ALFA 20000 UNIT/ML IJ SOLN: 20000.0000 [IU] | INTRAMUSCULAR | Status: DC

## 2012-10-10 MED ORDER — FERUMOXYTOL INJECTION 510 MG/17 ML
INTRAVENOUS | Status: AC
  Filled 2012-10-10: qty 17

## 2012-10-18 ENCOUNTER — Other Ambulatory Visit: Payer: Self-pay | Admitting: Gastroenterology

## 2012-10-18 ENCOUNTER — Other Ambulatory Visit (HOSPITAL_COMMUNITY): Payer: Self-pay | Admitting: Gastroenterology

## 2012-10-18 DIAGNOSIS — R110 Nausea: Secondary | ICD-10-CM

## 2012-10-23 ENCOUNTER — Ambulatory Visit
Admission: RE | Admit: 2012-10-23 | Discharge: 2012-10-23 | Disposition: A | Discharge status: Home / Self Care | Payer: Medicare Other | Source: Ambulatory Visit | Attending: Gastroenterology | Admitting: Gastroenterology

## 2012-10-23 ENCOUNTER — Encounter (HOSPITAL_COMMUNITY)
Admission: RE | Admit: 2012-10-23 | Discharge: 2012-10-23 | Disposition: A | Discharge status: Home / Self Care | Payer: Medicare Other | Source: Ambulatory Visit | Attending: Nephrology | Admitting: Nephrology

## 2012-10-23 DIAGNOSIS — N189 Chronic kidney disease, unspecified: Secondary | ICD-10-CM | POA: Insufficient documentation

## 2012-10-23 MED ORDER — SODIUM CHLORIDE 0.9 % IV SOLN
INTRAVENOUS | Status: AC
  Administered 2012-10-23: 12:00:00 via INTRAVENOUS

## 2012-10-24 ENCOUNTER — Inpatient Hospital Stay (HOSPITAL_COMMUNITY)
Admission: EM | Admit: 2012-10-24 | Discharge: 2012-10-27 | DRG: 074 | Disposition: A | Discharge status: Home / Self Care | Payer: Medicare Other | Attending: Internal Medicine | Admitting: Internal Medicine

## 2012-10-24 ENCOUNTER — Encounter (HOSPITAL_COMMUNITY): Payer: Self-pay | Admitting: Nurse Practitioner

## 2012-10-24 DIAGNOSIS — Z9861 Coronary angioplasty status: Secondary | ICD-10-CM

## 2012-10-24 DIAGNOSIS — E1149 Type 2 diabetes mellitus with other diabetic neurological complication: Principal | ICD-10-CM | POA: Diagnosis present

## 2012-10-24 DIAGNOSIS — F3289 Other specified depressive episodes: Secondary | ICD-10-CM | POA: Diagnosis present

## 2012-10-24 DIAGNOSIS — K838 Other specified diseases of biliary tract: Secondary | ICD-10-CM | POA: Diagnosis present

## 2012-10-24 DIAGNOSIS — E1165 Type 2 diabetes mellitus with hyperglycemia: Secondary | ICD-10-CM | POA: Diagnosis present

## 2012-10-24 DIAGNOSIS — Z8719 Personal history of other diseases of the digestive system: Secondary | ICD-10-CM

## 2012-10-24 DIAGNOSIS — K5909 Other constipation: Secondary | ICD-10-CM | POA: Diagnosis present

## 2012-10-24 DIAGNOSIS — Z9089 Acquired absence of other organs: Secondary | ICD-10-CM

## 2012-10-24 DIAGNOSIS — IMO0002 Reserved for concepts with insufficient information to code with codable children: Secondary | ICD-10-CM

## 2012-10-24 DIAGNOSIS — R1115 Cyclical vomiting syndrome unrelated to migraine: Secondary | ICD-10-CM

## 2012-10-24 DIAGNOSIS — E785 Hyperlipidemia, unspecified: Secondary | ICD-10-CM

## 2012-10-24 DIAGNOSIS — E872 Acidosis, unspecified: Secondary | ICD-10-CM | POA: Diagnosis present

## 2012-10-24 DIAGNOSIS — G6181 Chronic inflammatory demyelinating polyneuritis: Secondary | ICD-10-CM

## 2012-10-24 DIAGNOSIS — R1110 Vomiting, unspecified: Secondary | ICD-10-CM

## 2012-10-24 DIAGNOSIS — Z79899 Other long term (current) drug therapy: Secondary | ICD-10-CM

## 2012-10-24 DIAGNOSIS — H35059 Retinal neovascularization, unspecified, unspecified eye: Secondary | ICD-10-CM | POA: Diagnosis present

## 2012-10-24 DIAGNOSIS — Z794 Long term (current) use of insulin: Secondary | ICD-10-CM

## 2012-10-24 DIAGNOSIS — E860 Dehydration: Secondary | ICD-10-CM | POA: Diagnosis present

## 2012-10-24 DIAGNOSIS — E1139 Type 2 diabetes mellitus with other diabetic ophthalmic complication: Secondary | ICD-10-CM | POA: Diagnosis present

## 2012-10-24 DIAGNOSIS — I129 Hypertensive chronic kidney disease with stage 1 through stage 4 chronic kidney disease, or unspecified chronic kidney disease: Secondary | ICD-10-CM | POA: Diagnosis present

## 2012-10-24 DIAGNOSIS — K3184 Gastroparesis: Secondary | ICD-10-CM

## 2012-10-24 DIAGNOSIS — K219 Gastro-esophageal reflux disease without esophagitis: Secondary | ICD-10-CM | POA: Diagnosis present

## 2012-10-24 DIAGNOSIS — I2510 Atherosclerotic heart disease of native coronary artery without angina pectoris: Secondary | ICD-10-CM | POA: Diagnosis present

## 2012-10-24 DIAGNOSIS — E1129 Type 2 diabetes mellitus with other diabetic kidney complication: Secondary | ICD-10-CM

## 2012-10-24 DIAGNOSIS — Z7982 Long term (current) use of aspirin: Secondary | ICD-10-CM

## 2012-10-24 DIAGNOSIS — N189 Chronic kidney disease, unspecified: Secondary | ICD-10-CM | POA: Diagnosis present

## 2012-10-24 DIAGNOSIS — N179 Acute kidney failure, unspecified: Secondary | ICD-10-CM

## 2012-10-24 DIAGNOSIS — I252 Old myocardial infarction: Secondary | ICD-10-CM

## 2012-10-24 DIAGNOSIS — Z8249 Family history of ischemic heart disease and other diseases of the circulatory system: Secondary | ICD-10-CM

## 2012-10-24 DIAGNOSIS — I10 Essential (primary) hypertension: Secondary | ICD-10-CM

## 2012-10-24 LAB — URINALYSIS, ROUTINE W REFLEX MICROSCOPIC
Bilirubin Urine: NEGATIVE
Glucose, UA: >1000 mg/dL — AB
Ketones, ur: 15 mg/dL — AB
Leukocytes, UA: NEGATIVE
Nitrite: NEGATIVE
Protein, ur: >300 mg/dL — AB
Specific Gravity, Urine: 1.017 (ref 1.005–1.030)
Urobilinogen, UA: 0.2 mg/dL (ref 0.0–1.0)
pH: 5.5 (ref 5.0–8.0)

## 2012-10-24 LAB — HEPATIC FUNCTION PANEL
Alkaline Phosphatase: 49 U/L (ref 39–117)
Indirect Bilirubin: 0.1 mg/dL — ABNORMAL LOW (ref 0.3–0.9)
Total Bilirubin: 0.2 mg/dL — ABNORMAL LOW (ref 0.3–1.2)

## 2012-10-24 LAB — BASIC METABOLIC PANEL
BUN: 71 mg/dL — ABNORMAL HIGH (ref 6–23)
CO2: 17 mEq/L — ABNORMAL LOW (ref 19–32)
Calcium: 9 mg/dL (ref 8.4–10.5)
Chloride: 101 mEq/L (ref 96–112)
Creatinine, Ser: 6.93 mg/dL — ABNORMAL HIGH (ref 0.50–1.35)
GFR calc Af Amer: 9 mL/min — ABNORMAL LOW (ref >90)
GFR calc non Af Amer: 8 mL/min — ABNORMAL LOW (ref >90)
Glucose, Bld: 281 mg/dL — ABNORMAL HIGH (ref 70–99)
Potassium: 4.3 mEq/L (ref 3.5–5.1)
Sodium: 138 mEq/L (ref 135–145)

## 2012-10-24 LAB — CBC
HCT: 32.4 % — ABNORMAL LOW (ref 39.0–52.0)
Hemoglobin: 11 g/dL — ABNORMAL LOW (ref 13.0–17.0)
MCH: 29.3 pg (ref 26.0–34.0)
MCHC: 34 g/dL (ref 30.0–36.0)
MCV: 86.2 fL (ref 78.0–100.0)
Platelets: 239 10*3/uL (ref 150–400)
RBC: 3.76 MIL/uL — ABNORMAL LOW (ref 4.22–5.81)
RDW: 14 % (ref 11.5–15.5)
WBC: 9.7 10*3/uL (ref 4.0–10.5)

## 2012-10-24 LAB — URINE MICROSCOPIC-ADD ON

## 2012-10-24 LAB — LIPASE, BLOOD: Lipase: 18 U/L (ref 11–59)

## 2012-10-24 MED ORDER — INSULIN DETEMIR 100 UNIT/ML ~~LOC~~ SOLN
25.0000 [IU] | Freq: Every day | SUBCUTANEOUS | Status: DC
  Administered 2012-10-24 – 2012-10-26 (×3): 25 [IU] via SUBCUTANEOUS
  Filled 2012-10-24: qty 10

## 2012-10-24 MED ORDER — AMLODIPINE BESYLATE 10 MG PO TABS
10.0000 mg | ORAL_TABLET | Freq: Every day | ORAL | Status: DC
  Administered 2012-10-24 – 2012-10-27 (×4): 10 mg via ORAL
  Filled 2012-10-24 (×4): qty 1

## 2012-10-24 MED ORDER — SODIUM CHLORIDE 0.9 % IJ SOLN
3.0000 mL | Freq: Two times a day (BID) | INTRAMUSCULAR | Status: DC
  Administered 2012-10-24 – 2012-10-26 (×2): 3 mL via INTRAVENOUS

## 2012-10-24 MED ORDER — ONDANSETRON HCL 4 MG/2ML IJ SOLN: 4.0000 mg | Freq: Four times a day (QID) | INTRAMUSCULAR | Status: DC | PRN

## 2012-10-24 MED ORDER — FENTANYL 25 MCG/HR TD PT72: 100.0000 ug | MEDICATED_PATCH | TRANSDERMAL | Status: DC

## 2012-10-24 MED ORDER — HYDRALAZINE HCL 50 MG PO TABS
50.0000 mg | ORAL_TABLET | Freq: Three times a day (TID) | ORAL | Status: DC
  Administered 2012-10-24 – 2012-10-27 (×9): 50 mg via ORAL
  Filled 2012-10-24 (×14): qty 1

## 2012-10-24 MED ORDER — INSULIN ASPART 100 UNIT/ML ~~LOC~~ SOLN
0.0000 [IU] | Freq: Three times a day (TID) | SUBCUTANEOUS | Status: DC
  Administered 2012-10-25 – 2012-10-26 (×2): 2 [IU] via SUBCUTANEOUS
  Administered 2012-10-26 (×2): 3 [IU] via SUBCUTANEOUS
  Administered 2012-10-27: 11 [IU] via SUBCUTANEOUS
  Administered 2012-10-27: 3 [IU] via SUBCUTANEOUS

## 2012-10-24 MED ORDER — ONDANSETRON 8 MG/NS 50 ML IVPB
8.0000 mg | Freq: Once | INTRAVENOUS | Status: AC
  Administered 2012-10-24: 8 mg via INTRAVENOUS
  Filled 2012-10-24: qty 8

## 2012-10-24 MED ORDER — NITROGLYCERIN 0.4 MG SL SUBL: 0.4000 mg | SUBLINGUAL_TABLET | SUBLINGUAL | Status: DC | PRN

## 2012-10-24 MED ORDER — INSULIN ASPART 100 UNIT/ML ~~LOC~~ SOLN
0.0000 [IU] | Freq: Every day | SUBCUTANEOUS | Status: DC
  Administered 2012-10-24: 5 [IU] via SUBCUTANEOUS
  Administered 2012-10-25 – 2012-10-26 (×2): 4 [IU] via SUBCUTANEOUS

## 2012-10-24 MED ORDER — SODIUM CHLORIDE 0.9 % IV SOLN
INTRAVENOUS | Status: DC
  Administered 2012-10-24: 22:00:00 via INTRAVENOUS

## 2012-10-24 MED ORDER — ESCITALOPRAM OXALATE 10 MG PO TABS
10.0000 mg | ORAL_TABLET | Freq: Every day | ORAL | Status: DC
  Administered 2012-10-25 – 2012-10-27 (×3): 10 mg via ORAL
  Filled 2012-10-24 (×3): qty 1

## 2012-10-24 MED ORDER — CARVEDILOL 25 MG PO TABS
37.5000 mg | ORAL_TABLET | Freq: Two times a day (BID) | ORAL | Status: DC
  Administered 2012-10-24 – 2012-10-27 (×5): 37.5 mg via ORAL
  Filled 2012-10-24 (×8): qty 1

## 2012-10-24 MED ORDER — HYDRALAZINE HCL 20 MG/ML IJ SOLN
10.0000 mg | Freq: Once | INTRAMUSCULAR | Status: AC
  Administered 2012-10-24: 10 mg via INTRAVENOUS
  Filled 2012-10-24: qty 1

## 2012-10-24 MED ORDER — ONDANSETRON HCL 4 MG/2ML IJ SOLN
INTRAMUSCULAR | Status: AC
  Filled 2012-10-24: qty 2

## 2012-10-24 MED ORDER — CALCITRIOL 0.5 MCG PO CAPS
0.5000 ug | ORAL_CAPSULE | Freq: Every day | ORAL | Status: DC
  Administered 2012-10-25 – 2012-10-27 (×3): 0.5 ug via ORAL
  Filled 2012-10-24 (×3): qty 1

## 2012-10-24 MED ORDER — CALCITRIOL 0.25 MCG PO CAPS
0.2500 ug | ORAL_CAPSULE | Freq: Two times a day (BID) | ORAL | Status: DC
  Filled 2012-10-24: qty 1

## 2012-10-24 MED ORDER — ACETAMINOPHEN 650 MG RE SUPP: 650.0000 mg | Freq: Four times a day (QID) | RECTAL | Status: DC | PRN

## 2012-10-24 MED ORDER — PROMETHAZINE HCL 25 MG/ML IJ SOLN
12.5000 mg | Freq: Four times a day (QID) | INTRAMUSCULAR | Status: DC | PRN
  Administered 2012-10-25: 25 mg via INTRAVENOUS
  Administered 2012-10-25: 12.5 mg via INTRAVENOUS
  Filled 2012-10-24 (×3): qty 1

## 2012-10-24 MED ORDER — ACETAMINOPHEN 325 MG PO TABS: 650.0000 mg | ORAL_TABLET | Freq: Four times a day (QID) | ORAL | Status: DC | PRN

## 2012-10-24 MED ORDER — SODIUM CHLORIDE 0.9 % IV BOLUS (SEPSIS)
1000.0000 mL | Freq: Once | INTRAVENOUS | Status: AC
  Administered 2012-10-24: 1000 mL via INTRAVENOUS

## 2012-10-24 NOTE — ED Provider Notes (Signed)
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  Has been ongoing for the past several months but worse within the past few days.  PMHx significant for CAD s/p stenting,  demyelinating inflammatory radiculopathy, diabetes, HTN, hyperlipidemia, and CKD followed closely by Dr. Otho Bellows, nephrology. Reports recently stopped furosemide and telmisartan in setting of worsening renal function. Patient received IV fluids yesterday but has continued nausea and vomiting and difficulty keeping anything significant down which is why presented to ED. No diarrhea. No abdominal pain. No fever or chills. No sick contacts. No urinary complaints. Fairly minimal LE edema and is not acutely changed.    CSN: [id number]  Arrival date & time 10/24/12  1631   First MD Initiated Contact with Patient 10/24/12 1719      Chief Complaint  Patient presents with  . Dehydration    (Consider location/radiation/quality/duration/timing/severity/associated sxs/prior treatment) HPI  Past Medical History  Diagnosis Date  . Bradycardia     Hx of  . Pleural effusion, right   . Kidney disease   . Hypomagnesemia   . Hypertension   . Diabetes mellitus   . Hyperlipidemia   . Coronary artery disease   . 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 MI (myocardial infarction) 1995    anterior  . CIDP (chronic inflammatory demyelinating polyneuropathy)     Past Surgical History  Procedure Laterality Date  . Tonsillectomy    . Cardiac catheterization  1995    2 stents   . Coronary angioplasty with stent placement  1995    Family History  Problem Relation Age of Onset  . Heart attack Father     History  Substance Use Topics  . Smoking status: Former Smoker    Types: Cigarettes    Quit date: 08/17/1979  . Smokeless tobacco: Never Used  . Alcohol Use: No      Review of Systems  All systems reviewed and negative, other than as noted in HPI.   Allergies   Review of patient's allergies indicates no known allergies.  Home Medications   Current Outpatient Rx  Name  Route  Sig  Dispense  Refill  . amLODipine (NORVASC) 5 MG tablet   Oral   Take 5 mg by mouth daily.           Marland Kitchen aspirin 325 MG EC tablet   Oral   Take 325 mg by mouth daily.           . Calcium Citrate (CITRACAL PO)   Oral   Take 0.25 mg by mouth 2 (two) times daily.          . carvedilol (COREG) 25 MG tablet      Taking one and 1/2 two times daily.         Marland Kitchen docusate sodium (COLACE) 250 MG capsule   Oral   Take 250 mg by mouth daily.         Marland Kitchen escitalopram (LEXAPRO) 10 MG tablet   Oral   Take 10 mg by mouth daily.           Marland Kitchen ezetimibe (ZETIA) 10 MG tablet   Oral   Take 1 tablet (10 mg total) by mouth daily.   30 tablet   11   . fentaNYL (DURAGESIC - DOSED MCG/HR) 100 MCG/HR   Transdermal   Place 1 patch onto the skin every 3 (three) days.           Marland Kitchen  furosemide (LASIX) 80 MG tablet   Oral   Take 80 mg by mouth daily.         . hydrALAZINE (APRESOLINE) 50 MG tablet   Oral   Take 50 mg by mouth 2 (two) times daily.          . insulin detemir (LEVEMIR) 100 UNIT/ML injection   Subcutaneous   Inject 50 Units into the skin at bedtime.          . nitroGLYCERIN (NITROSTAT) 0.4 MG SL tablet   Sublingual   Place 1 tablet (0.4 mg total) under the tongue every 5 (five) minutes as needed for chest pain.   25 tablet   3   . rosuvastatin (CRESTOR) 40 MG tablet   Oral   Take 1 tablet (40 mg total) by mouth daily.   30 tablet   11   . telmisartan (MICARDIS) 80 MG tablet   Oral   Take 80 mg by mouth daily.         . vitamin B-12 (CYANOCOBALAMIN) 100 MCG tablet   Oral   Take 50 mcg by mouth every 30 (thirty) days.           BP 198/76  Pulse 81  Temp(Src) 97.4 F (36.3 C) (Axillary)  Resp 16  Wt 177 lb (80.287 kg)  BMI 26.92 kg/m2  SpO2 98%  Physical Exam  Nursing note and vitals reviewed. Constitutional: He appears  well-developed and well-nourished. No distress.  HENT:  Head: Normocephalic and atraumatic.  Eyes: Conjunctivae are normal. Right eye exhibits no discharge. Left eye exhibits no discharge.  Neck: Neck supple.  Cardiovascular: Normal rate, regular rhythm and normal heart sounds.  Exam reveals no gallop and no friction rub.   No murmur heard. Pulmonary/Chest: Effort normal and breath sounds normal. No respiratory distress.  Abdominal: Soft. He exhibits no distension. There is no tenderness.  Musculoskeletal: He exhibits no edema and no tenderness.  Lower extremities symmetric as compared to each other. Mild edema. No calf tenderness. Negative Homan's. No palpable cords.   Neurological: He is alert.  Skin: Skin is warm and dry. He is not diaphoretic.  Psychiatric: He has a normal mood and affect. His behavior is normal. Thought content normal.    ED Course  Procedures (including critical care time)  Labs Reviewed  CBC - Abnormal; Notable for the following:    RBC 3.76 (*)    Hemoglobin 11.0 (*)    HCT 32.4 (*)    All other components within normal limits  BASIC METABOLIC PANEL - Abnormal; Notable for the following:    CO2 17 (*)    Glucose, Bld 281 (*)    BUN 71 (*)    Creatinine, Ser 6.93 (*)    GFR calc non Af Amer 8 (*)    GFR calc Af Amer 9 (*)    All other components within normal limits  URINALYSIS, ROUTINE W REFLEX MICROSCOPIC   US Abdomen Complete  10/23/2012  *RADIOLOGY REPORT*  Clinical Data:  Nausea.  COMPLETE ABDOMINAL ULTRASOUND  Comparison:  None.  Findings:  Gallbladder:  Nonshadowing echogenic sludge is seen.  No gallstones, wall thickening or sonographic Murphy's sign.  Common bile duct:  Measures up to 8 mm, mildly prominent for age.  Liver:  Appears mildly heterogeneous in echotexture.  No definite focal lesions.  IVC:  Obscured by bowel gas.  Pancreas:  Obscured by bowel gas.  Spleen:  Measures 6.2 cm, negative.  Right Kidney:  Measures 11.4 cm.  Parenchymal  echogenicity is normal.  A 4 mm hyperechoic lesion with posterior acoustic shadowing is seen in the interpolar region.  No hydronephrosis.  Left Kidney:  Measures 11.6 cm.  Parenchymal echogenicity is normal.  No hydronephrosis.  No focal lesions.  Abdominal aorta:  Measures up to 3.0 cm.  Atherosclerotic plaque is seen in the mid and distal portions.  IMPRESSION:  1.  Gallbladder sludge without evidence of gallstones or acute cholecystitis. Extrahepatic bile duct is mildly prominent for age. 2.  Heterogeneous liver.  Finding can be seen with steatosis. 3.  Right renal stone without obstruction. 4.  Borderline infrarenal aortic aneurysm.   Original Report Authenticated By: Leanna Battles, M.D.      1. Acute on chronic renal failure   2. Nausea and vomiting    MDM  61yM with poorly controlled nausea and vomiting. Acute on chronic renal failure. IVF, antiemetics. Discussed with hospitalist for admission.         Raeford Razor, MD 10/24/12 [phone]

## 2012-10-24 NOTE — ED Notes (Signed)
Pt reports n/v for past months. Renal pt sent by dr Randa Evens for admission by hospitalist for dehydration. Pt received IV fluid bolus at short stay yesterday with no relief.

## 2012-10-24 NOTE — ED Notes (Signed)
Pt states able to hold down oral fluid.

## 2012-10-24 NOTE — ED Notes (Signed)
Floor called to give report and receiving Sharita RN getting another report; will call back to get report in 10 minutes.

## 2012-10-24 NOTE — H&P (Addendum)
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 For the past 3 months, he's had intermittent vomiting, but it's worsened tremendously over the past few weeks. He has been seeing Dr. Carman Ching for this problem. He has been given Reglan and Zofran but continues to have problems keeping anything down. He rarely checks his sugars. He has not taken Levemir for the past 3 nights. He rarely takes NovoLog meal coverage, because he forgets. He has a history of chronic kidney disease at baseline creatinine is usually about 3.7. Today it is over 6. He has had no abdominal pain. He has had no problems with bowel movements, though they have been less frequent. He's lost about 8 pounds over the past few months. His blood work was checked as an outpatient and his Micardis and Lasix were stopped due to raise in his creatinine. He was given 2 L of saline yesterday in short stay and sent home. He called Dr. Randa Evens for continued vomiting, and was advised to come to the emergency room. He's had no fevers or chills. No sick contacts. No recent travel. He drinks bottled water. No hematemesis. He had a colonoscopy and EGD last year which he reports showed nothing alarming. He has had an outpatient ultrasound of the abdomen which showed sludge but no cholecystitis or other pathology. A gastric emptying study is planned for Friday. Patient has been diabetic for about 20 years. He rarely checks his sugars. He has chronic neuropathic pain and is on a fentanyl patch.  Review of Systems: Systems reviewed and as above otherwise negative.  Past Medical  History  Diagnosis Date  . Bradycardia     Hx of  . Pleural effusion, right   . Kidney disease   . Hypomagnesemia   . Hypertension   . Diabetes mellitus   . Hyperlipidemia   . Coronary artery disease   . 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 MI (myocardial infarction) 1995    anterior  . CIDP (chronic inflammatory demyelinating polyneuropathy)    Past Surgical History  Procedure Laterality Date  . Tonsillectomy    . Cardiac catheterization  1995    2 stents   . Coronary angioplasty with stent placement  1995   Social History:  Patient denies smoking drinking or history of drugs. He is married. 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 Heart attack Father    diabetes  Prior to Admission medications   Medication Sig Start Date End Date Taking? Authorizing Provider  amLODipine (NORVASC) 5 MG tablet Take 5 mg by mouth daily.     Yes Historical Provider, MD  aspirin 325 MG EC tablet Take 325 mg by mouth daily.     Yes Historical Provider, MD  calcitRIOL (ROCALTROL) 0.25 MCG capsule Take 0.25 mcg by mouth 2 (two) times daily.   Yes Historical Provider, MD  carvedilol (COREG) 25 MG tablet Take 37.5 mg by mouth 2 (two) times daily with a meal.   Yes Historical Provider, MD  docusate sodium (COLACE) 250 MG capsule  Take 250 mg by mouth daily.   Yes Historical Provider, MD  epoetin alfa (EPOGEN,PROCRIT) 16109 UNIT/ML injection Inject 20,000 Units into the skin once. Last dose in the past month.   Yes Historical Provider, MD  escitalopram (LEXAPRO) 10 MG tablet Take 10 mg by mouth daily.     Yes Historical Provider, MD  fentaNYL (DURAGESIC - DOSED MCG/HR) 100 MCG/HR Place 1 patch onto the skin every 3 (three) days.   Yes Historical Provider, MD  hydrALAZINE (APRESOLINE) 50 MG tablet Take 50 mg by mouth 3 (three) times daily.    Yes Historical Provider, MD  insulin aspart  (NOVOLOG) 100 UNIT/ML injection Inject 20 Units into the skin 3 (three) times daily before meals.   Yes Historical Provider, MD  insulin detemir (LEVEMIR) 100 UNIT/ML injection Inject 50 Units into the skin at bedtime.    Yes Historical Provider, MD  metoCLOPramide (REGLAN) 10 MG tablet Take 10 mg by mouth 3 (three) times daily as needed (for nausea). (Orally disintegrating Tablets)   Yes Historical Provider, MD  polyethylene glycol (MIRALAX / GLYCOLAX) packet Take 17 g by mouth daily as needed (for constipation; usually takes 2-3 times weekly.).   Yes Historical Provider, MD  rosuvastatin (CRESTOR) 40 MG tablet Take 1 tablet (40 mg total) by mouth daily. 06/20/12  Yes Antonieta Iba, MD  furosemide (LASIX) 80 MG tablet Take 80 mg by mouth daily.    Historical Provider, MD  nitroGLYCERIN (NITROSTAT) 0.4 MG SL tablet Place 1 tablet (0.4 mg total) under the tongue every 5 (five) minutes as needed for chest pain. 12/23/11 12/22/12  Antonieta Iba, MD  telmisartan (MICARDIS) 80 MG tablet Take 80 mg by mouth daily.    Historical Provider, MD   Physical Exam: Filed Vitals:   10/24/12 1830 10/24/12 1900 10/24/12 1915 10/24/12 1930  BP: 198/77 210/80 213/82 201/81  Pulse: 83 85 88 86  Temp:      TempSrc:      Resp: 14 16 18 16   Weight:      SpO2: 96% 98% 98% 97%   BP 190/71  Pulse 88  Temp(Src) 98.5 F (36.9 C) (Oral)  Resp 20  Wt 80.287 kg (177 lb)  BMI 26.92 kg/m2  SpO2 97%  General Appearance:    Alert, cooperative, no distress, appears stated age  Head:    Normocephalic, without obvious abnormality, atraumatic  Eyes:    PERRL, conjunctiva/corneas clear, EOM's intact, fundi    benign, both eyes       Ears:    Normal TM's and external ear canals, both ears  Nose:   Nares normal, septum midline, mucosa normal, no drainage   or sinus tenderness  Throat:   Lips, mucosa, and tongue normal; teeth and gums normal  Neck:   Supple, symmetrical, trachea midline, no adenopathy;       thyroid:   No enlargement/tenderness/nodules; no carotid   bruit or JVD  Back:     Symmetric, no curvature, ROM normal, no CVA tenderness  Lungs:     Clear to auscultation bilaterally, respirations unlabored  Chest wall:    No tenderness or deformity  Heart:    Regular rate and rhythm, S1 and S2 normal, no murmur, rub   or gallop  Abdomen:     Soft, non-tender, bowel sounds active all four quadrants,    no masses, no organomegaly  Genitalia:    deferred  Rectal:     deferred   Extremities:   Extremities normal, atraumatic,  no cyanosis or edema  Pulses:   2+ and symmetric all extremities  Skin:   Skin color, texture, turgor normal, no rashes or lesions  Lymph nodes:   Cervical, supraclavicular, and axillary nodes normal  Neurologic:   CNII-XII intact. Normal strength, sensation and reflexes      throughout    psychiatric: Normal affect   Labs on Admission:  Basic Metabolic Panel:  Recent Labs Lab 10/24/12 1813  NA 138  K 4.3  CL 101  CO2 17*  GLUCOSE 281*  BUN 71*  CREATININE 6.93*  CALCIUM 9.0   Liver Function Tests: No results found for this basename: AST, ALT, ALKPHOS, BILITOT, PROT, ALBUMIN,  in the last 168 hours No results found for this basename: LIPASE, AMYLASE,  in the last 168 hours No results found for this basename: AMMONIA,  in the last 168 hours CBC:  Recent Labs Lab 10/24/12 1813  WBC 9.7  HGB 11.0*  HCT 32.4*  MCV 86.2  PLT 239   Cardiac Enzymes: No results found for this basename: CKTOTAL, CKMB, CKMBINDEX, TROPONINI,  in the last 168 hours  BNP (last 3 results) No results found for this basename: PROBNP,  in the last 8760 hours CBG: No results found for this basename: GLUCAP,  in the last 168 hours  Radiological Exams on Admission: US Abdomen Complete  10/23/2012  *RADIOLOGY REPORT*  Clinical Data:  Nausea.  COMPLETE ABDOMINAL ULTRASOUND  Comparison:  None.  Findings:  Gallbladder:  Nonshadowing echogenic sludge is seen.  No gallstones, wall thickening  or sonographic Murphy's sign.  Common bile duct:  Measures up to 8 mm, mildly prominent for age.  Liver:  Appears mildly heterogeneous in echotexture.  No definite focal lesions.  IVC:  Obscured by bowel gas.  Pancreas:  Obscured by bowel gas.  Spleen:  Measures 6.2 cm, negative.  Right Kidney:  Measures 11.4 cm.  Parenchymal echogenicity is normal.  A 4 mm hyperechoic lesion with posterior acoustic shadowing is seen in the interpolar region.  No hydronephrosis.  Left Kidney:  Measures 11.6 cm.  Parenchymal echogenicity is normal.  No hydronephrosis.  No focal lesions.  Abdominal aorta:  Measures up to 3.0 cm.  Atherosclerotic plaque is seen in the mid and distal portions.  IMPRESSION:  1.  Gallbladder sludge without evidence of gallstones or acute cholecystitis. Extrahepatic bile duct is mildly prominent for age. 2.  Heterogeneous liver.  Finding can be seen with steatosis. 3.  Right renal stone without obstruction. 4.  Borderline infrarenal aortic aneurysm.   Original Report Authenticated By: Leanna Battles, M.D.     EKG: NSR  Assessment/Plan   Intractable vomiting:  Likely gastroparesis: Will give Zofran and Phenergan and get a gastric emptying study in the morning. Consider consulting Dr. Randa Evens, as patient is known to him. Will also check lipase, liver function tests, TSH. Check a morning cortisol and if low consider cosyntropin stem test if no etiology found. Clear liquids for now. N.p.o. after midnight for test.    Malignant hypertension: His Lasix and ARB have been held. Also, he has not taken his afternoon Coreg or hydralazine. His nausea is slightly improved and he may be able to take pills. I will resume his home medications and give IV labetalol as needed for refractory hypertension  Uncontrolled type 2 diabetes with renal, neurologic, retinal complications. For now, give half dose of Levemir and sliding scale NovoLog. Check hemoglobin A1c  Acute renal failure: Likely prerenal: Will check  UA. Monitor her intake  and output. Monitor daily labs.  Increased anion gap metabolic acidosis: Check lactate. Could be starvation ketosis. Patient does not appear toxic at this time.    Hyperlipemia    CAD (coronary artery disease): Stable    CIDP (chronic inflammatory demyelinating polyneuropathy): Patient walks with a cane. I will ask that he get up with assistance only  Code Status: full Family Communication: discussed with family Disposition Plan: home  Time spent: 53  SULLIVAN,CORINNA L [facility]  If 7PM-7AM, please contact night-coverage [email or web address] Password [facility] 10/24/2012, 8:32 PM

## 2012-10-24 NOTE — ED Notes (Signed)
Patient states have not taking blood pressure medication and when in hospital given hydralazine.

## 2012-10-25 ENCOUNTER — Inpatient Hospital Stay (HOSPITAL_COMMUNITY): Payer: Medicare Other

## 2012-10-25 ENCOUNTER — Encounter (HOSPITAL_COMMUNITY): Payer: Self-pay | Admitting: *Deleted

## 2012-10-25 LAB — GLUCOSE, CAPILLARY
Glucose-Capillary: 100 mg/dL — ABNORMAL HIGH (ref 70–99)
Glucose-Capillary: 62 mg/dL — ABNORMAL LOW (ref 70–99)
Glucose-Capillary: 136 mg/dL — ABNORMAL HIGH (ref 70–99)
Glucose-Capillary: 304 mg/dL — ABNORMAL HIGH (ref 70–99)

## 2012-10-25 LAB — BASIC METABOLIC PANEL
CO2: 20 mEq/L (ref 19–32)
Glucose, Bld: 133 mg/dL — ABNORMAL HIGH (ref 70–99)
Potassium: 3.7 mEq/L (ref 3.5–5.1)
Sodium: 139 mEq/L (ref 135–145)

## 2012-10-25 MED ORDER — DOCUSATE SODIUM 50 MG PO CAPS
50.0000 mg | ORAL_CAPSULE | Freq: Two times a day (BID) | ORAL | Status: DC
  Administered 2012-10-25 – 2012-10-27 (×5): 50 mg via ORAL
  Filled 2012-10-25 (×7): qty 1

## 2012-10-25 MED ORDER — TECHNETIUM TC 99M SULFUR COLLOID
2.0000 | Freq: Once | INTRAVENOUS | Status: AC | PRN
  Administered 2012-10-25: 2 via INTRAVENOUS

## 2012-10-25 MED ORDER — METOCLOPRAMIDE HCL 5 MG PO TBDP
10.0000 mg | ORAL_TABLET | Freq: Three times a day (TID) | ORAL | Status: DC
  Administered 2012-10-26 – 2012-10-27 (×7): 10 mg via ORAL
  Filled 2012-10-25 (×2): qty 2

## 2012-10-25 MED ORDER — FENTANYL 25 MCG/HR TD PT72: 100.0000 ug | MEDICATED_PATCH | TRANSDERMAL | Status: DC

## 2012-10-25 MED ORDER — DEXTROSE-NACL 5-0.45 % IV SOLN
INTRAVENOUS | Status: DC
  Administered 2012-10-25: 12:00:00 via INTRAVENOUS

## 2012-10-25 MED ORDER — DEXTROSE 50 % IV SOLN
25.0000 mL | Freq: Once | INTRAVENOUS | Status: AC | PRN
  Administered 2012-10-25: 25 mL via INTRAVENOUS

## 2012-10-25 MED ORDER — DEXTROSE 50 % IV SOLN
INTRAVENOUS | Status: AC
  Filled 2012-10-25: qty 50

## 2012-10-25 MED ORDER — NON FORMULARY: 10.0000 mg | Freq: Three times a day (TID) | Status: DC

## 2012-10-25 MED ORDER — ONDANSETRON HCL 4 MG/2ML IJ SOLN
4.0000 mg | Freq: Four times a day (QID) | INTRAMUSCULAR | Status: DC | PRN
  Administered 2012-10-25: 4 mg via INTRAVENOUS
  Filled 2012-10-25: qty 2

## 2012-10-25 MED ORDER — POLYETHYLENE GLYCOL 3350 17 G PO PACK
17.0000 g | PACK | Freq: Every day | ORAL | Status: DC
  Administered 2012-10-25 – 2012-10-26 (×2): 17 g via ORAL
  Filled 2012-10-25 (×3): qty 1

## 2012-10-25 MED ORDER — METOCLOPRAMIDE HCL 10 MG PO TABS
10.0000 mg | ORAL_TABLET | Freq: Three times a day (TID) | ORAL | Status: DC
  Administered 2012-10-25: 10 mg via ORAL
  Filled 2012-10-25 (×5): qty 1

## 2012-10-25 MED ORDER — SODIUM BICARBONATE 650 MG PO TABS
650.0000 mg | ORAL_TABLET | Freq: Three times a day (TID) | ORAL | Status: DC
  Administered 2012-10-25 – 2012-10-27 (×6): 650 mg via ORAL
  Filled 2012-10-25 (×8): qty 1

## 2012-10-25 NOTE — Progress Notes (Signed)
Utilization review completed.  

## 2012-10-25 NOTE — Progress Notes (Signed)
10/24/12 21:45 Received patient from ED,admitted due to intractable vomiting,DHN.Patient is alert and oriented x4.Placed on telemetry.Pt is on clear liquid but will be NPO after midnight. Capers Hagmann Joselita,RN

## 2012-10-25 NOTE — Progress Notes (Signed)
INITIAL NUTRITION ASSESSMENT  DOCUMENTATION CODES Per approved criteria  -Not Applicable   INTERVENTION: 1. RD provided information on "Gastroparesis Nutrition Therapy" per patient's request 2. RD to continue to follow to assess need for oral nutrition supplements  NUTRITION DIAGNOSIS: Inadequate oral intake related to GI distress as evidenced by pt report.   Goal: Diet advancement to meet >90% of estimated nutrition needs.  Monitor:  weight trends, lab trends, I/O's, further education needs  Reason for Assessment: Malnutrition Screening  62 y.o. male  Admitting Dx: Intractable vomiting  ASSESSMENT: Admitted with vomiting and dehydration. 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 Pt NPO for gastric emptying study, he suspects that he has gastroparesis, stating "I would be surprised if I didn't have that." Interested in nutrition information regarding Gastroparesis - RD provided.  Pt reports poor oral intake x several weeks associated with intermittent n/v. Per EPIC weight hx, weights have been fairly stable, however pt reports "8 lb weight loss" recently. Pt is at nutrition risk given poor oral intake.   Height: Ht Readings from Last 1 Encounters:  10/25/12 5\' 8"  (1.727 m)    Weight: Wt Readings from Last 1 Encounters:  10/24/12 178 lb 5.6 oz (80.9 kg)    Ideal Body Weight: 154 lb  % Ideal Body Weight: 116%  Wt Readings from Last 10 Encounters:  10/24/12 178 lb 5.6 oz (80.9 kg)  10/23/12 177 lb (80.287 kg)  10/10/12 175 lb (79.379 kg)  09/04/12 171 lb (77.565 kg)  06/20/12 180 lb 4 oz (81.761 kg)  12/23/11 185 lb 8 oz (84.142 kg)  08/27/11 181 lb 12 oz (82.441 kg)  12/22/10 170 lb (77.111 kg)    Usual Body Weight:  175 - 185 lb  % Usual Body Weight: 100%  BMI:  Body mass index is 27.12 kg/(m^2). Overweight.  Estimated Nutritional Needs: Kcal: 1600 - 1800  Protein: 65 - 70 grams Fluid: 1.8 -  2 liters  Skin: intact  Diet Order: NPO  EDUCATION NEEDS: -No education needs identified at this time   Intake/Output Summary (Last 24 hours) at 10/25/12 1119 Last data filed at 10/25/12 0900  Gross per 24 hour  Intake 541.67 ml  Output    775 ml  Net -233.33 ml    Last BM: PTA  Labs:   Recent Labs Lab 10/24/12 1813 10/25/12 0619  NA 138 139  K 4.3 3.7  CL 101 104  CO2 17* 20  BUN 71* 64*  CREATININE 6.93* 6.18*  CALCIUM 9.0 8.6  GLUCOSE 281* 133*    CBG (last 3)   Recent Labs  10/24/12 2135 10/25/12 0800  GLUCAP 361* 100*    Scheduled Meds: . amLODipine  10 mg Oral Daily  . calcitRIOL  0.5 mcg Oral Daily  . carvedilol  37.5 mg Oral BID WC  . escitalopram  10 mg Oral Daily  . [START ON 10/27/2012] fentaNYL  100 mcg Transdermal Q72H  . hydrALAZINE  50 mg Oral TID PC & HS  . insulin aspart  0-15 Units Subcutaneous TID WC  . insulin aspart  0-5 Units Subcutaneous QHS  . insulin detemir  25 Units Subcutaneous QHS  . sodium chloride  3 mL Intravenous Q12H    Continuous Infusions: . sodium chloride 100 mL/hr at 10/24/12 2223    Past Medical History  Diagnosis Date  . Bradycardia     Hx of  . Pleural effusion, right   . Kidney disease   .  Hypomagnesemia   . Hypertension   . Diabetes mellitus   . Hyperlipidemia   . Coronary artery disease   . 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 MI (myocardial infarction) 1995    anterior  . CIDP (chronic inflammatory demyelinating polyneuropathy)     Past Surgical History  Procedure Laterality Date  . Tonsillectomy    . Cardiac catheterization  1995    2 stents   . Coronary angioplasty with stent placement  1995    Jarold Motto MS, RD, LDN Pager: [phone] After-hours pager: [phone]

## 2012-10-25 NOTE — Progress Notes (Signed)
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 He has diabetes and multiple issues connected with that in addition to a demyelinating neuropathy. He has chronic pain and is on chronic Duragesic patches. He has severe hypertension and metabolic syndrome. He had screening colonoscopy by myself 6/13 negative other than small skin tag in the anus and hemorrhoids. EGD done at the same time for nausea showed some inflammations duodenal bulb biopsies were negative for significant inflammation or for celiac disease. EGD was otherwise unremarkable. I saw the patient in the office recently with worsening reflux symptoms and marked nausea with frequent vomiting. At times it was so severe he would become dehydrated and almost stop making in the urine. We ordered outpatient gallbladder ultrasound which a gallbladder sludge in a gastric emptying scan which is not yet been done. The patient is chronically been taking metoclopramide because of documented gastroparesis. In spite of continuing to use the metoclopramide, he has continued to have severe nausea and vomiting. He saw his nephrologist a couple days ago and had IV fluids. Despite that he has continued to vomit. We talked to his family by phone yesterday and he was unable to keep any medications or liquids and was admitted.  Objective: Vital signs in last 24 hours: Temp:  [97.4 F (36.3 C)-98.9 F (37.2 C)] 98.8 F (37.1 C) (03/12 0559) Pulse Rate:  [70-88] 70 (03/12 0559) Resp:  [14-20] 17 (03/12 0559) BP: (153-213)/(61-82) 153/61 mmHg (03/12 0559) SpO2:  [95 %-98 %] 98 % (03/12 0559) Weight:  [80.287 kg (177 lb)-80.9 kg (178 lb 5.6 oz)] 80.9 kg (178 lb 5.6 oz) (03/11 2144)    Intake/Output from previous day: 03/11 0701 - 03/12 0700 In: 541.7 [P.O.:480; I.V.:61.7] Out: 575 [Urine:575] Intake/Output this shift: Total I/O In: -  Out: 200  [Urine:200]  PE: General -- no acute distress CV -- no murmurs are gallops Lungs -- clear Abdomen -- none distended in soft with absolutely no right upper quadrant tenderness.  Lab Results:  Recent Labs  10/24/12 1813  WBC 9.7  HGB 11.0*  HCT 32.4*  PLT 239   BMET  Recent Labs  10/24/12 1813 10/25/12 0619  NA 138 139  K 4.3 3.7  CL 101 104  CO2 17* 20  CREATININE 6.93* 6.18*   LFT  Recent Labs  10/24/12 2050  PROT 6.2  AST 11  ALT 7  ALKPHOS 49  BILITOT 0.2*  BILIDIR 0.1  IBILI 0.1*   PT/INR No results found for this basename: LABPROT, INR,  in the last 72 hours PANCREAS  Recent Labs  10/24/12 2050  LIPASE 18         Studies/Results: No results found.  Medications: I have reviewed the patient's current medications.  Assessment/Plan: 1. Severe nausea and vomiting. Patient has chronic renal failure, gallbladder sludge, and documented gastroparesis. It is difficult to know which of these conditions is causing his current symptoms. He does feel better with IV fluids and I agree with going ahead with gastric emptying scan. We may need to consider hepatobiliary  scan with ejection fraction   EDWARDS JR,Pascarella L 10/25/2012, 11:37 AM

## 2012-10-25 NOTE — Progress Notes (Signed)
Pt's home medication (Reglan 5mg  disintegrating tabs) has been sent down to the pharmacy for the pharmacy to dispense to the pt (in place of the Reglan 5mg  regular po tabs). Pt states that the disintegrating formulary seems to work better for him.

## 2012-10-25 NOTE — Progress Notes (Signed)
[facility] PROGRESS NOTE  Daniel Anthony ZOX:[id number] DOB: 14-Apr-1951 DOA: 10/24/2012 PCP: Bosie Clos, MD  Assessment/Plan: 1-Intractable nausea and vomiting: concerns for gastroparesis as main cause for his problems; other considerations includes chronic renal failure and uremia, biliary colic and also gastroenteritis.  -GI has been consulted and will follow recommendations. -For now continue IVF's and antiemetics -Gastric emptying study to be done today. -Reglan 10mg  QID to be started after Gastric emptying study has been completed.  2-Acute on chronic renal failure: due to pre-renal azotemia with ongoing decrease PO intake and dehydration/volume contraction from N/V. Will continue IVF's for now and follow BMET in am. Renal aware of patient admission and available if needed.  3-DM: A!C 9.3' will continue SSI and Levemir once eating again.  4-hypoglycemia: mild due to NPO status for gastric emptying suty; will change IVF's to D51/2NS while NPO. Adjust insulin dose base on his CBG's trend  5-Chronic constipation: continue bowel regimen (miralax and colace).  6-elevated anion gap metabolic acidosis: due to renal failure. Will start bicarb tablets.  7-CIDP:stable. Continue current medication regimen. OOB with assistance and cane.  8-Depression: continue celexa  9-HTN: continue amlodipine and carvedilol  10-HLD: continue statins.  Code Status: Full Family Communication: no family at bedside Disposition Plan: home when medically stable   Consultants:  GI  Procedures:  gatsric emptying study  Antibiotics:  none  HPI/Subjective: Patient still complaining of intermittent nausea; no vomiting; patient denies CP, SOB, dysuria or fever. Episode of hypoglycemia this morning while NPO for gastric emptying study.  Objective: Filed Vitals:   10/24/12 2032 10/24/12 2045 10/24/12 2144 10/25/12 0559  BP: 190/71 182/80 185/69 153/61  Pulse: 88 87 85 70  Temp: 98.5 F  (36.9 C)  98.9 F (37.2 C) 98.8 F (37.1 C)  TempSrc: Oral  Oral Oral  Resp: 20  17 17   Height:    5\' 8"  (1.727 m)  Weight:   80.9 kg (178 lb 5.6 oz)   SpO2: 97% 96% 96% 98%    Intake/Output Summary (Last 24 hours) at 10/25/12 1226 Last data filed at 10/25/12 0900  Gross per 24 hour  Intake 541.67 ml  Output    775 ml  Net -233.33 ml   Filed Weights   10/24/12 1716 10/24/12 2144  Weight: 80.287 kg (177 lb) 80.9 kg (178 lb 5.6 oz)    Exam:   General:  NAD, feeling better; afebrile  Cardiovascular: S1 and S2; no rubs or gallops  Respiratory: CTA  Abdomen: soft, ND, positive BS  Musculoskeletal: FROM, no erythema or joint swelling  Neuro: non focal  Data Reviewed: Basic Metabolic Panel:  Recent Labs Lab 10/24/12 1813 10/25/12 0619  NA 138 139  K 4.3 3.7  CL 101 104  CO2 17* 20  GLUCOSE 281* 133*  BUN 71* 64*  CREATININE 6.93* 6.18*  CALCIUM 9.0 8.6   Liver Function Tests:  Recent Labs Lab 10/24/12 2050  AST 11  ALT 7  ALKPHOS 49  BILITOT 0.2*  PROT 6.2  ALBUMIN 2.7*    Recent Labs Lab 10/24/12 2050  LIPASE 18   CBC:  Recent Labs Lab 10/24/12 1813  WBC 9.7  HGB 11.0*  HCT 32.4*  MCV 86.2  PLT 239   CBG:  Recent Labs Lab 10/24/12 2135 10/25/12 0800 10/25/12 1131 10/25/12 1211  GLUCAP 361* 100* 62* 103*   Studies: No results found.  Scheduled Meds: . amLODipine  10 mg Oral Daily  . calcitRIOL  0.5 mcg Oral Daily  .  carvedilol  37.5 mg Oral BID WC  . dextrose      . escitalopram  10 mg Oral Daily  . [START ON 10/27/2012] fentaNYL  100 mcg Transdermal Q72H  . hydrALAZINE  50 mg Oral TID PC & HS  . insulin aspart  0-15 Units Subcutaneous TID WC  . insulin aspart  0-5 Units Subcutaneous QHS  . insulin detemir  25 Units Subcutaneous QHS  . sodium bicarbonate  650 mg Oral TID  . sodium chloride  3 mL Intravenous Q12H   Continuous Infusions: . dextrose 5 % and 0.45% NaCl 75 mL/hr at 10/25/12 1225    Principal  Problem:   Intractable vomiting Active Problems:   DM (diabetes mellitus), type 2, uncontrolled, with renal complications   Hyperlipemia   CAD (coronary artery disease)   Malignant hypertension   CIDP (chronic inflammatory demyelinating polyneuropathy)    Time spent: >30 minutes    Daniel Anthony,Daniel Anthony  [facility] Pager [phone]. If 7PM-7AM, please contact night-coverage at [email or web address], password [facility] 10/25/2012, 12:26 PM  LOS: 1 day

## 2012-10-25 NOTE — Progress Notes (Signed)
Hypoglycemic Event  CBG: 62  Treatment: D50 IV 25 mL  Symptoms: None  Follow-up CBG: Time: 12:11 CBG Result: 103  Possible Reasons for Event: Inadequate meal intake  Comments/MD notified: MD notified    SCHAUER, CHRISTINA L  Remember to initiate Hypoglycemia Order Set & complete

## 2012-10-26 DIAGNOSIS — K3184 Gastroparesis: Secondary | ICD-10-CM

## 2012-10-26 LAB — CBC
HCT: 27.6 % — ABNORMAL LOW (ref 39.0–52.0)
Hemoglobin: 9.6 g/dL — ABNORMAL LOW (ref 13.0–17.0)
MCH: 29.6 pg (ref 26.0–34.0)
RBC: 3.24 MIL/uL — ABNORMAL LOW (ref 4.22–5.81)

## 2012-10-26 LAB — BASIC METABOLIC PANEL
BUN: 58 mg/dL — ABNORMAL HIGH (ref 6–23)
Chloride: 105 mEq/L (ref 96–112)
GFR calc non Af Amer: 10 mL/min — ABNORMAL LOW (ref >90)
Glucose, Bld: 205 mg/dL — ABNORMAL HIGH (ref 70–99)
Potassium: 3.6 mEq/L (ref 3.5–5.1)
Sodium: 138 mEq/L (ref 135–145)

## 2012-10-26 LAB — GLUCOSE, CAPILLARY
Glucose-Capillary: 132 mg/dL — ABNORMAL HIGH (ref 70–99)
Glucose-Capillary: 331 mg/dL — ABNORMAL HIGH (ref 70–99)

## 2012-10-26 MED ORDER — SODIUM CHLORIDE 0.9 % IV SOLN
INTRAVENOUS | Status: DC
  Administered 2012-10-26: 05:00:00 via INTRAVENOUS
  Administered 2012-10-26: 75 mL/h via INTRAVENOUS
  Administered 2012-10-26 – 2012-10-27 (×2): via INTRAVENOUS

## 2012-10-26 NOTE — Progress Notes (Signed)
[facility] PROGRESS NOTE Subjective patient doing much better today with very little nausea no bomb. Gastric emptying scan showed 36% retention at 2 hours. Creatinine down to 5.76 with fluids. Remains on Miralax and metoclopramide. Denies any nausea or abdominal pain.  Objective: Vital signs in last 24 hours: Temp:  [98.2 F (36.8 C)-98.8 F (37.1 C)] 98.2 F (36.8 C) (03/13 1313) Pulse Rate:  [67-71] 68 (03/13 1313) Resp:  [16-20] 20 (03/13 1313) BP: (125-139)/(53-64) 125/64 mmHg (03/13 1313) SpO2:  [95 %-98 %] 95 % (03/13 1313) Weight:  [84.687 kg (186 lb 11.2 oz)-85.049 kg (187 lb 8 oz)] 84.687 kg (186 lb 11.2 oz) (03/13 0553) Last BM Date:  (pt says 3-4 days ago; baseline)  Intake/Output from previous day: 03/12 0701 - 03/13 0700 In: 1791.7 [I.V.:1791.7] Out: 1025 [Urine:1025] Intake/Output this shift: Total I/O In: 360 [P.O.:360] Out: 300 [Urine:300]  PE: General -- in good spirits and no active distress Abdomen -- soft good bowel sounds absolutely no right upper quadrant pain  Lab Results:  Recent Labs  10/24/12 1813 10/26/12 0545  WBC 9.7 7.5  HGB 11.0* 9.6*  HCT 32.4* 27.6*  PLT 239 223   BMET  Recent Labs  10/24/12 1813 10/25/12 0619 10/26/12 0545  NA 138 139 138  K 4.3 3.7 3.6  CL 101 104 105  CO2 17* 20 20  CREATININE 6.93* 6.18* 5.76*   LFT  Recent Labs  10/24/12 2050  PROT 6.2  AST 11  ALT 7  ALKPHOS 49  BILITOT 0.2*  BILIDIR 0.1  IBILI 0.1*   PT/INR No results found for this basename: LABPROT, INR,  in the last 72 hours PANCREAS  Recent Labs  10/24/12 2050  LIPASE 18         Studies/Results: Nm Gastric Emptying  10/25/2012  *RADIOLOGY REPORT*  Clinical Data:  Intractable vomiting.  Poorly controlled diabetes.  NUCLEAR MEDICINE GASTRIC EMPTYING SCAN  Technique:  After oral ingestion of radiolabeled meal, sequential abdominal images were obtained for 120 minutes.  Residual percentage of activity remaining  within the stomach was calculated at 60 and 120 minutes.  Radiopharmaceutical:  2.00 mCi Tc-32m sulfur colloid cooked in one egg served with toast and 8 ounces of water PO.  Comparison:  None.  Findings: Gastric retention at 60 minutes was 58%.  Gastric retention at 120 minutes was 36%.  IMPRESSION: Abnormal gastric emptying with 36% retention at 2 hours.   Original Report Authenticated By: Francene Boyers, M.D.     Medications: I have reviewed the patient's current medications.  Assessment/Plan: 1. Nausea and vomiting. Probably multifactorial. No right upper quadrant pain to suggest biliary sludge as etiology. Suspect is a due to gastroparesis and compliance may be a factor. Patient may be able to be discharged on oral and sublingual metoclopramide. We can follow him in the office in the next several weeks.   Daniel Anthony,Daniel Anthony 10/26/2012, 2:53 PM

## 2012-10-26 NOTE — Progress Notes (Signed)
[facility] PROGRESS NOTE  [name] ZOX:[id number] DOB: June 19, 1951 DOA: 10/24/2012 PCP: Daniel Clos, MD  Assessment/Plan: 1-Intractable nausea and vomiting: secondary to gastroparesis. Mild uremia due to worsening on CKD contributing, but not cause of main problem. Improved. -GI has been consulted and will follow recommendations; no need for EGD at this point. -For now continue PRN antiemetics; IVF's changed to maintenance. -Gastric emptying study demonstrated gastroparesis as etiology for his symptoms. Will treat with reglan QID schedule and also advise of frequent small meals. Good diabetes control to be achieved as an outpatient.  2-Acute on chronic renal failure: due to pre-renal azotemia with ongoing decrease PO intake and dehydration/volume contraction from N/V. Cr down to 5.7; will change fluids to maintenance as patient tolerating diet; continue bicarb tablets and follow BMET in am; if trending continue to be downward will d/c home. Renal aware of patient admission and available if needed.  3-DM: A1C 9.3; will continue SSI and Levemir; dose to be adjusted as patient now tolerating PO. modified carb diet.  4-hypoglycemia: resolved after diet restarted. IVF's changed to NS. Continue SSI and levemir.  5-Chronic constipation: continue bowel regimen (miralax and colace). Patient reports bowel movement.  6-elevated anion gap metabolic acidosis: due to renal failure (acute on chronic and dehydration). Will continue bicarb tablets. Improved with IVF's and improvements of renal function.  7-CIDP: stable. Continue current medication regimen. OOB with assistance and cane.  8-Depression: continue celexa  9-HTN: continue amlodipine and carvedilol  10-HLD: continue statins.  Code Status: Full Family Communication: no family at bedside Disposition Plan: home when medically stable   Consultants:  GI  Procedures:  gatsric emptying study: abnormal with 36% retention at  120 minutes.  Antibiotics:  none  HPI/Subjective: Patient reports feeling a lot better; tolerating diet and no further vomiting or significant nausea.  Denies CP, SOB, dysuria or fever.    Objective: Filed Vitals:   10/26/12 0553 10/26/12 0928 10/26/12 1313 10/26/12 1642  BP: 137/56 139/53 125/64 139/54  Pulse: 67 71 68 72  Temp: 98.4 F (36.9 C) 98.3 F (36.8 C) 98.2 F (36.8 C) 98.8 F (37.1 C)  TempSrc: Oral     Resp: 16 18 20 18   Height:      Weight: 84.687 kg (186 lb 11.2 oz)     SpO2: 98% 98% 95% 96%    Intake/Output Summary (Last 24 hours) at 10/26/12 1841 Last data filed at 10/26/12 1841  Gross per 24 hour  Intake    720 ml  Output    775 ml  Net    -55 ml   Filed Weights   10/24/12 2144 10/25/12 2121 10/26/12 0553  Weight: 80.9 kg (178 lb 5.6 oz) 85.049 kg (187 lb 8 oz) 84.687 kg (186 lb 11.2 oz)    Exam:   General:  NAD, feeling better; afebrile  Cardiovascular: S1 and S2; no rubs or gallops  Respiratory: CTA  Abdomen: soft, ND, positive BS  Musculoskeletal: FROM, no erythema or joint swelling  Neuro: non focal  Data Reviewed: Basic Metabolic Panel:  Recent Labs Lab 10/24/12 1813 10/25/12 0619 10/26/12 0545  NA 138 139 138  K 4.3 3.7 3.6  CL 101 104 105  CO2 17* 20 20  GLUCOSE 281* 133* 205*  BUN 71* 64* 58*  CREATININE 6.93* 6.18* 5.76*  CALCIUM 9.0 8.6 8.0*   Liver Function Tests:  Recent Labs Lab 10/24/12 2050  AST 11  ALT 7  ALKPHOS 49  BILITOT 0.2*  PROT 6.2  ALBUMIN 2.7*    Recent Labs Lab 10/24/12 2050  LIPASE 18   CBC:  Recent Labs Lab 10/24/12 1813 10/26/12 0545  WBC 9.7 7.5  HGB 11.0* 9.6*  HCT 32.4* 27.6*  MCV 86.2 85.2  PLT 239 223   CBG:  Recent Labs Lab 10/25/12 1211 10/25/12 1634 10/25/12 2124 10/26/12 0737 10/26/12 1138  GLUCAP 103* 136* 304* 151* 132*   Studies: Nm Gastric Emptying  10/25/2012  *RADIOLOGY REPORT*  Clinical Data:  Intractable vomiting.  Poorly controlled  diabetes.  NUCLEAR MEDICINE GASTRIC EMPTYING SCAN  Technique:  After oral ingestion of radiolabeled meal, sequential abdominal images were obtained for 120 minutes.  Residual percentage of activity remaining within the stomach was calculated at 60 and 120 minutes.  Radiopharmaceutical:  2.00 mCi Tc-25m sulfur colloid cooked in one egg served with toast and 8 ounces of water PO.  Comparison:  None.  Findings: Gastric retention at 60 minutes was 58%.  Gastric retention at 120 minutes was 36%.  IMPRESSION: Abnormal gastric emptying with 36% retention at 2 hours.   Original Report Authenticated By: Francene Boyers, M.D.     Scheduled Meds: . amLODipine  10 mg Oral Daily  . calcitRIOL  0.5 mcg Oral Daily  . carvedilol  37.5 mg Oral BID WC  . docusate sodium  50 mg Oral BID  . escitalopram  10 mg Oral Daily  . [START ON 10/27/2012] fentaNYL  100 mcg Transdermal Q72H  . hydrALAZINE  50 mg Oral TID PC & HS  . insulin aspart  0-15 Units Subcutaneous TID WC  . insulin aspart  0-5 Units Subcutaneous QHS  . insulin detemir  25 Units Subcutaneous QHS  . Metoclopramide HCl  10 mg Oral TID AC & HS  . polyethylene glycol  17 g Oral Daily  . sodium bicarbonate  650 mg Oral TID  . sodium chloride  3 mL Intravenous Q12H   Continuous Infusions: . sodium chloride 75 mL/hr (10/26/12 1338)    Principal Problem:   Intractable vomiting Active Problems:   DM (diabetes mellitus), type 2, uncontrolled, with renal complications   Hyperlipemia   CAD (coronary artery disease)   Malignant hypertension   CIDP (chronic inflammatory demyelinating polyneuropathy)    Time spent: >30 minutes    Daniel Anthony  [facility] Pager [phone]. If 7PM-7AM, please contact night-coverage at [email or web address], password [facility] 10/26/2012, 6:41 PM  LOS: 2 days

## 2012-10-27 ENCOUNTER — Encounter (HOSPITAL_COMMUNITY): Payer: Medicare Other

## 2012-10-27 LAB — BASIC METABOLIC PANEL
Calcium: 8.2 mg/dL — ABNORMAL LOW (ref 8.4–10.5)
GFR calc non Af Amer: 10 mL/min — ABNORMAL LOW (ref >90)
Potassium: 3.7 mEq/L (ref 3.5–5.1)
Sodium: 138 mEq/L (ref 135–145)

## 2012-10-27 LAB — GLUCOSE, CAPILLARY: Glucose-Capillary: 343 mg/dL — ABNORMAL HIGH (ref 70–99)

## 2012-10-27 MED ORDER — METOCLOPRAMIDE HCL 10 MG PO TABS: 10.0000 mg | ORAL_TABLET | Freq: Three times a day (TID) | ORAL | Status: DC | PRN

## 2012-10-27 MED ORDER — DOCUSATE SODIUM 250 MG PO CAPS: 250.0000 mg | ORAL_CAPSULE | Freq: Two times a day (BID) | ORAL | Status: DC

## 2012-10-27 MED ORDER — ONDANSETRON 8 MG PO TBDP: 8.0000 mg | ORAL_TABLET | Freq: Three times a day (TID) | ORAL | Status: DC | PRN

## 2012-10-27 MED ORDER — TELMISARTAN 80 MG PO TABS: 80.0000 mg | ORAL_TABLET | Freq: Every day | ORAL | Status: DC

## 2012-10-27 MED ORDER — AMLODIPINE BESYLATE 10 MG PO TABS: 10.0000 mg | ORAL_TABLET | Freq: Every day | ORAL | Status: DC

## 2012-10-27 MED ORDER — POLYETHYLENE GLYCOL 3350 17 G PO PACK: 17.0000 g | PACK | Freq: Every day | ORAL | Status: DC

## 2012-10-27 MED ORDER — FUROSEMIDE 80 MG PO TABS: 80.0000 mg | ORAL_TABLET | Freq: Every day | ORAL | Status: DC

## 2012-10-27 MED ORDER — PANTOPRAZOLE SODIUM 20 MG PO TBEC: 20.0000 mg | DELAYED_RELEASE_TABLET | Freq: Every day | ORAL | Status: DC

## 2012-10-27 MED ORDER — SODIUM BICARBONATE 650 MG PO TABS: 650.0000 mg | ORAL_TABLET | Freq: Two times a day (BID) | ORAL | Status: DC

## 2012-10-27 NOTE — Discharge Summary (Signed)
Physician Discharge Summary  Daniel Anthony AVW:[id number] DOB: [birth year] DOA: 10/24/2012  PCP: Bosie Clos, MD  Admit date: 10/24/2012 Discharge date: 10/27/2012  Time spent: >30 minutes  Recommendations for Outpatient Follow-up:  1. BMET to follow renal function and electrolytes. 2. Reassess BP and DM and adjust medications for better control.  Discharge Diagnoses:  Principal Problem:   Intractable vomiting Active Problems:   DM (diabetes mellitus), type 2, uncontrolled, with renal complications   Hyperlipemia   CAD (coronary artery disease)   Malignant hypertension   CIDP (chronic inflammatory demyelinating polyneuropathy)   Discharge Condition: stable and improved; will be discharge home.  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 For the past 3 months, he's had intermittent vomiting, but it's worsened tremendously over the past few weeks. He has been seeing Dr. Carman Ching for this problem. He has been given Reglan and Zofran but continues to have problems keeping anything down. He rarely checks his sugars. He has not taken Levemir for the past 3 nights. He rarely takes NovoLog meal coverage, because he forgets. He has a history of chronic kidney disease at baseline creatinine is usually about 3.7. Today it is over 6. He has had no abdominal pain. He has had no problems with bowel movements, though they have been less frequent. He's lost about 8 pounds over the past few months. His blood work was checked as an outpatient and his Micardis and Lasix were stopped due to raise in his creatinine. He was given 2 L of saline yesterday in short stay  and sent home. He called Dr. Randa Evens for continued vomiting, and was advised to come to the emergency room. He's had no fevers or chills. No sick contacts. No recent travel. He drinks bottled water. No hematemesis. He had a colonoscopy and EGD last year which he reports showed nothing alarming. He has had an outpatient ultrasound of the abdomen which showed sludge but no cholecystitis or other pathology. A gastric emptying study is planned for Friday. Patient has been diabetic for about 20 years. He rarely checks his sugars. He has chronic neuropathic pain and is on a fentanyl patch.   Hospital Course:  1-Intractable nausea and vomiting: secondary to gastroparesis. Mild uremia due to worsening on CKD contributing, but not cause of main problem. Improved. No further nausea or vomiting. Tolerating diet and keeping himself hydrated. -PRN ODT zofran -Gastric emptying study demonstrated gastroparesis as etiology for his symptoms. Will treat with reglan QID schedule and also advise of frequent small meals. Good diabetes control to be achieved as an outpatient. Will follow with PCP in 2 weeks.   2-Acute on chronic renal failure: due to pre-renal azotemia with ongoing decrease PO intake and dehydration/volume contraction from N/V. Also continue use of lasix and micardis. Cr down to 5.6 at discharge. -Will follow with renal service in 1 week -no lasix or micardis during this time  3-DM: A1C 9.3; will continue SSI and Levemir; dose to be adjusted as an outpatient for better control.   4-hypoglycemia: transient secondary to NPO status while on insulin tx for gastric emptying study. No further episodes since he  has been tolerating diet.  5-Chronic constipation: continue bowel regimen (miralax and colace).   6-elevated anion gap metabolic acidosis: due to renal failure (acute on chronic and dehydration). Will continue bicarb tablets. Improved with IVF's and improvements of renal function. Will follow with  nephrology and PCP.   7-CIDP: stable. Continue current medication regimen. OOB with assistance and cane.   8-Depression: continue celexa   9-HTN: continue amlodipine, hydralazine and carvedilol; stop lasix and micardis until folow up with nephrologist.  10-HLD: continue statins.  11-GERD: started on PPI.   Procedures: gatsric emptying study: abnormal with 36% retention at 120 minutes.  Consultations:  GI  Renal (Dr. Allena Katz curbside since admission for plans and recommendations)  Discharge Exam: Filed Vitals:   10/26/12 1642 10/26/12 2153 10/27/12 0458 10/27/12 0804  BP: 139/54 142/61 163/63 161/62  Pulse: 72 71 74 72  Temp: 98.8 F (37.1 C) 97.7 F (36.5 C) 98.7 F (37.1 C) 98.2 F (36.8 C)  TempSrc:  Oral Oral Axillary  Resp: 18 18 18 18   Height:      Weight:  85.1 kg (187 lb 9.8 oz)    SpO2: 96% 96% 96% 99%    General: NAD, afebrile; no further nausea or vomiting; tolerating diet. Cardiovascular: S1 and S2; no rubs or gallops Respiratory: CTA bilaterally Abdomen:soft, NT, ND, positive BS Neuro: non focal  Discharge Instructions  Discharge Orders   Future Appointments Provider Department Dept Phone   11/07/2012 9:00 AM [facility] [phone]   Future Orders Complete By Expires     Diet - low sodium heart healthy  As directed     Discharge instructions  As directed     Comments:      KEEP YOURSELF HYDRATED ARRANGE FOLLOW UP WITH PCP IN 2 WEEKS FOLLOW WITH NEPHROLOGIST AS INSTRUCTED FOLLOW A LOW CARBOHYDRATES DIET AND MAKE SURE TO FOLLOW MULTIPLE SMALL MEALS A DAY. REGLAN TO BE TAKEN 30 MINS BEFORE MAIN MEALS AND ALSO AT BEDTIME        Medication List    TAKE these medications       amLODipine 10 MG tablet  Commonly known as:  NORVASC  Take 1 tablet (10 mg total) by mouth daily.     aspirin 325 MG EC tablet  Take 325 mg by mouth daily.     calcitRIOL 0.25 MCG capsule  Commonly known as:   ROCALTROL  Take 0.5 mcg by mouth daily.     carvedilol 25 MG tablet  Commonly known as:  COREG  Take 37.5 mg by mouth 2 (two) times daily with a meal.     docusate sodium 250 MG capsule  Commonly known as:  COLACE  Take 1 capsule (250 mg total) by mouth 2 (two) times daily.     epoetin alfa 09811 UNIT/ML injection  Commonly known as:  EPOGEN,PROCRIT  Inject 20,000 Units into the skin once. Last dose in the past month.     escitalopram 10 MG tablet  Commonly known as:  LEXAPRO  Take 10 mg by mouth daily.     fentaNYL 100 MCG/HR  Commonly known as:  DURAGESIC - dosed mcg/hr  Place 1 patch onto the skin every 3 (three) days.     furosemide 80 MG tablet  Commonly known as:  LASIX  Take 1 tablet (80 mg total) by mouth daily. HOLD THIS MEDICATION UNTIL YOU FOLLOW WITH NEPHROLOGIST     hydrALAZINE 50 MG tablet  Commonly known as:  APRESOLINE  Take 50 mg by mouth 3 (three) times daily.     insulin aspart 100 UNIT/ML injection  Commonly known as:  novoLOG  Inject 20 Units into the skin 3 (three) times daily before meals.     insulin detemir 100 UNIT/ML injection  Commonly known as:  LEVEMIR  Inject 50 Units into the skin at bedtime.     metoCLOPramide 10 MG tablet  Commonly known as:  REGLAN  Take 1 tablet (10 mg total) by mouth 3 (three) times daily as needed (for nausea). (Orally disintegrating Tablets)     nitroGLYCERIN 0.4 MG SL tablet  Commonly known as:  NITROSTAT  Place 1 tablet (0.4 mg total) under the tongue every 5 (five) minutes as needed for chest pain.     ondansetron 8 MG disintegrating tablet  Commonly known as:  ZOFRAN ODT  Take 1 tablet (8 mg total) by mouth every 8 (eight) hours as needed for nausea.     pantoprazole 20 MG tablet  Commonly known as:  PROTONIX  Take 1 tablet (20 mg total) by mouth daily.     polyethylene glycol packet  Commonly known as:  MIRALAX / GLYCOLAX  Take 17 g by mouth daily.     rosuvastatin 40 MG tablet  Commonly known as:   CRESTOR  Take 1 tablet (40 mg total) by mouth daily.     sodium bicarbonate 650 MG tablet  Take 1 tablet (650 mg total) by mouth 2 (two) times daily.     telmisartan 80 MG tablet  Commonly known as:  MICARDIS  Take 1 tablet (80 mg total) by mouth daily. HOLD UNTIL FOLLOW UP WITH NEPHROLOGIST           Follow-up Information   Follow up with Bosie Clos, MD. Schedule an appointment as soon as possible for a visit in 2 weeks.   Contact information:   [address] [place] [address] [phone]       The results of significant diagnostics from this hospitalization (including imaging, microbiology, ancillary and laboratory) are listed below for reference.    Significant Diagnostic Studies: Nm Gastric Emptying  10/25/2012  *RADIOLOGY REPORT*  Clinical Data:  Intractable vomiting.  Poorly controlled diabetes.  NUCLEAR MEDICINE GASTRIC EMPTYING SCAN  Technique:  After oral ingestion of radiolabeled meal, sequential abdominal images were obtained for 120 minutes.  Residual percentage of activity remaining within the stomach was calculated at 60 and 120 minutes.  Radiopharmaceutical:  2.00 mCi Tc-44m sulfur colloid cooked in one egg served with toast and 8 ounces of water PO.  Comparison:  None.  Findings: Gastric retention at 60 minutes was 58%.  Gastric retention at 120 minutes was 36%.  IMPRESSION: Abnormal gastric emptying with 36% retention at 2 hours.   Original Report Authenticated By: Francene Boyers, M.D.    US Abdomen Complete  10/23/2012  *RADIOLOGY REPORT*  Clinical Data:  Nausea.  COMPLETE ABDOMINAL ULTRASOUND  Comparison:  None.  Findings:  Gallbladder:  Nonshadowing echogenic sludge is seen.  No gallstones, wall thickening or sonographic Murphy's sign.  Common bile duct:  Measures up to 8 mm, mildly prominent for age.  Liver:  Appears mildly heterogeneous in echotexture.  No definite focal lesions.  IVC:  Obscured by bowel gas.  Pancreas:  Obscured by bowel gas.   Spleen:  Measures 6.2 cm, negative.  Right Kidney:  Measures 11.4 cm.  Parenchymal echogenicity is normal.  A 4 mm hyperechoic lesion with posterior acoustic shadowing is seen in  the interpolar region.  No hydronephrosis.  Left Kidney:  Measures 11.6 cm.  Parenchymal echogenicity is normal.  No hydronephrosis.  No focal lesions.  Abdominal aorta:  Measures up to 3.0 cm.  Atherosclerotic plaque is seen in the mid and distal portions.  IMPRESSION:  1.  Gallbladder sludge without evidence of gallstones or acute cholecystitis. Extrahepatic bile duct is mildly prominent for age. 2.  Heterogeneous liver.  Finding can be seen with steatosis. 3.  Right renal stone without obstruction. 4.  Borderline infrarenal aortic aneurysm.   Original Report Authenticated By: Leanna Battles, M.D.     Labs: Basic Metabolic Panel:  Recent Labs Lab 10/24/12 1813 10/25/12 0619 10/26/12 0545 10/27/12 0536  NA 138 139 138 138  K 4.3 3.7 3.6 3.7  CL 101 104 105 104  CO2 17* 20 20 21   GLUCOSE 281* 133* 205* 271*  BUN 71* 64* 58* 58*  CREATININE 6.93* 6.18* 5.76* 5.60*  CALCIUM 9.0 8.6 8.0* 8.2*   Liver Function Tests:  Recent Labs Lab 10/24/12 2050  AST 11  ALT 7  ALKPHOS 49  BILITOT 0.2*  PROT 6.2  ALBUMIN 2.7*    Recent Labs Lab 10/24/12 2050  LIPASE 18   CBC:  Recent Labs Lab 10/24/12 1813 10/26/12 0545  WBC 9.7 7.5  HGB 11.0* 9.6*  HCT 32.4* 27.6*  MCV 86.2 85.2  PLT 239 223   CBG:  Recent Labs Lab 10/26/12 1138 10/26/12 1640 10/26/12 2152 10/27/12 0808 10/27/12 1152  GLUCAP 132* 154* 331* 180* 343*    Signed:  MADERA,CARLOS  [facility] 10/27/2012, 2:10 PM

## 2012-10-27 NOTE — Progress Notes (Signed)
Doing well for discharge later today will follow up with me in next few weeks.

## 2012-11-07 ENCOUNTER — Encounter (HOSPITAL_COMMUNITY)
Admission: RE | Admit: 2012-11-07 | Discharge: 2012-11-07 | Disposition: A | Discharge status: Home / Self Care | Payer: Medicare Other | Source: Ambulatory Visit | Attending: Nephrology | Admitting: Nephrology

## 2012-11-07 MED ORDER — EPOETIN ALFA 20000 UNIT/ML IJ SOLN
INTRAMUSCULAR | Status: AC
  Administered 2012-11-07: 20000 [IU] via SUBCUTANEOUS
  Filled 2012-11-07: qty 1

## 2012-11-07 MED ORDER — EPOETIN ALFA 20000 UNIT/ML IJ SOLN: 20000.0000 [IU] | INTRAMUSCULAR | Status: DC

## 2012-11-14 ENCOUNTER — Other Ambulatory Visit (HOSPITAL_COMMUNITY): Payer: Self-pay | Admitting: Gastroenterology

## 2012-11-14 ENCOUNTER — Telehealth (INDEPENDENT_AMBULATORY_CARE_PROVIDER_SITE_OTHER): Payer: Self-pay

## 2012-11-14 DIAGNOSIS — R110 Nausea: Secondary | ICD-10-CM

## 2012-11-14 NOTE — Telephone Encounter (Signed)
The patient called me back and we scheduled him for 4/14 per his request.

## 2012-11-14 NOTE — Telephone Encounter (Signed)
Per Dr Derrell Lolling, I called the pt to let him know Dr Derrell Lolling spoke to Dr Randa Evens.  He would like to see him after his HIDA scan.  I see it is scheduled for the 9th.  I can offer him time on the 10th, 11th or the following week.  I want to see what is best for him.

## 2012-11-17 ENCOUNTER — Encounter (HOSPITAL_COMMUNITY): Payer: Medicare Other

## 2012-11-20 ENCOUNTER — Ambulatory Visit (HOSPITAL_COMMUNITY): Payer: Self-pay

## 2012-11-20 ENCOUNTER — Other Ambulatory Visit (HOSPITAL_COMMUNITY): Payer: Self-pay | Admitting: *Deleted

## 2012-11-21 ENCOUNTER — Encounter (HOSPITAL_COMMUNITY)
Admission: RE | Admit: 2012-11-21 | Discharge: 2012-11-21 | Disposition: A | Discharge status: Home / Self Care | Payer: Medicare Other | Source: Ambulatory Visit | Attending: Nephrology | Admitting: Nephrology

## 2012-11-21 DIAGNOSIS — N189 Chronic kidney disease, unspecified: Secondary | ICD-10-CM | POA: Insufficient documentation

## 2012-11-21 LAB — RENAL FUNCTION PANEL
Chloride: 94 mEq/L — ABNORMAL LOW (ref 96–112)
GFR calc Af Amer: 9 mL/min — ABNORMAL LOW (ref >90)
Glucose, Bld: 116 mg/dL — ABNORMAL HIGH (ref 70–99)
Phosphorus: 5.4 mg/dL — ABNORMAL HIGH (ref 2.3–4.6)
Potassium: 3.6 mEq/L (ref 3.5–5.1)
Sodium: 137 mEq/L (ref 135–145)

## 2012-11-21 LAB — POCT HEMOGLOBIN-HEMACUE: Hemoglobin: 10.8 g/dL — ABNORMAL LOW (ref 13.0–17.0)

## 2012-11-21 MED ORDER — SODIUM CHLORIDE 0.9 % IV SOLN
INTRAVENOUS | Status: DC
  Administered 2012-11-21: 10:00:00 via INTRAVENOUS

## 2012-11-21 MED ORDER — FERUMOXYTOL INJECTION 510 MG/17 ML
INTRAVENOUS | Status: AC
  Administered 2012-11-21: 510 mg via INTRAVENOUS
  Filled 2012-11-21: qty 17

## 2012-11-21 MED ORDER — EPOETIN ALFA 20000 UNIT/ML IJ SOLN: 20000.0000 [IU] | INTRAMUSCULAR | Status: DC

## 2012-11-21 MED ORDER — FERUMOXYTOL INJECTION 510 MG/17 ML: 510.0000 mg | Freq: Once | INTRAVENOUS | Status: AC

## 2012-11-21 MED ORDER — EPOETIN ALFA 20000 UNIT/ML IJ SOLN
INTRAMUSCULAR | Status: AC
  Administered 2012-11-21: 20000 [IU] via SUBCUTANEOUS
  Filled 2012-11-21: qty 1

## 2012-11-22 ENCOUNTER — Encounter (HOSPITAL_COMMUNITY)
Admission: RE | Admit: 2012-11-22 | Discharge: 2012-11-22 | Disposition: A | Discharge status: Home / Self Care | Payer: Medicare Other | Source: Ambulatory Visit | Attending: Gastroenterology | Admitting: Gastroenterology

## 2012-11-22 DIAGNOSIS — G8929 Other chronic pain: Secondary | ICD-10-CM | POA: Insufficient documentation

## 2012-11-22 DIAGNOSIS — R110 Nausea: Secondary | ICD-10-CM | POA: Insufficient documentation

## 2012-11-22 MED ORDER — TECHNETIUM TC 99M MEBROFENIN IV KIT: 5.5000 | PACK | Freq: Once | INTRAVENOUS | Status: AC | PRN

## 2012-11-27 ENCOUNTER — Encounter (INDEPENDENT_AMBULATORY_CARE_PROVIDER_SITE_OTHER): Payer: Self-pay | Admitting: General Surgery

## 2012-11-27 ENCOUNTER — Ambulatory Visit (INDEPENDENT_AMBULATORY_CARE_PROVIDER_SITE_OTHER): Payer: 59 | Admitting: General Surgery

## 2012-11-27 VITALS — BP 110/58 | HR 64 | Temp 97.2°F | Resp 20 | Ht 68.0 in | Wt 179.0 lb

## 2012-11-27 DIAGNOSIS — R1110 Vomiting, unspecified: Secondary | ICD-10-CM

## 2012-11-27 DIAGNOSIS — R1115 Cyclical vomiting syndrome unrelated to migraine: Secondary | ICD-10-CM

## 2012-11-27 NOTE — Progress Notes (Signed)
Patient ID: Daniel Anthony, male   DOB: [birth year], 62 y.o.   MRN: [id number]  Chief Complaint  Patient presents with  . New Evaluation    eval GB    HPI Daniel Anthony is a 62 y.o. male.  He is referred by Dr. Carman Ching for evaluation of intractable nausea and vomiting, gallbladder sludge, with a question as to whether he would benefit from cholecystectomy.  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 He has severe diabetes which is been complicated by coronary artery disease necessitating catheterization and stints. Recent nuclear medicine study looked good. Also complicated by neuropathy and he is on a fentanyl patch. Complicated by hypertension. Completed by progressive renal insufficiency with a creatinine greater than 6. He is going to have to go on dialysis and plans AV fistula in [place] in the next week or 2. Dr. Darrick Penna is his his nephrologist. He gives a history starting in December 2013 of nausea and eventually vomiting without any aggravating or alleviating factors. He denies abdominal pain. He's had an episode of severe dehydration which caused decline in his kidney function. He  had to be admitted to the hospital 5 weeks ago with severe dehydration. He was having intractable vomiting although this is low volume, gastric fluid and food only not bilious.  Zofran, Reglan, erythromycin  helped. He states that for the past 2 weeks he has been off all Reglan and Zofran and erythromycin. He has no more nausea and vomiting and is eating well. His bowel movements are somewhat slow because he is on fentanyl but corrects that with MiraLAX. He has never had abdominal pain with this.  Imaging studies included ultrasound on 10/18/2012 which shows gallbladder sludge. Common bile duct 8 mm. Right renal stone. Abdominal aorta 3 mm diameter. Gastric emptying study shows gastroparesis with 36% retention at 2 hours. CCK stimulated biliary scan shows borderline low ejection fraction of  30%. Patent cystic duct and common bile duct.  He is here with his wife today. He is in no distress today. Friendly as usual.  HPI  Past Medical History  Diagnosis Date  . Bradycardia     Hx of  . Pleural effusion, right   . Kidney disease   . Hypomagnesemia   . Hypertension   . Diabetes mellitus   . Hyperlipidemia   . Coronary artery disease   . 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 MI (myocardial infarction) 1995    anterior  . CIDP (chronic inflammatory demyelinating polyneuropathy)     Past Surgical History  Procedure Laterality Date  . Tonsillectomy    . Cardiac catheterization  1995    2 stents   . Coronary angioplasty with stent placement  1995  . Lithotripsy      Family History  Problem Relation Age of Onset  . Heart attack Father   . Hypertension Father   . Diabetes Father   . Cancer Father     prostate  . Hypertension Mother   . Cancer Maternal Grandmother     colon    Social History History  Substance Use Topics  . Smoking status: Former Smoker    Types: Cigarettes    Quit date: 08/17/1979  . Smokeless tobacco: Never Used  . Alcohol Use: No    No Known Allergies  Current Outpatient Prescriptions  Medication Sig Dispense Refill  . amLODipine (NORVASC) 10 MG tablet Take 1 tablet (10 mg total) by mouth  daily.  30 tablet  1  . aspirin 325 MG EC tablet Take 325 mg by mouth daily.        . calcitRIOL (ROCALTROL) 0.25 MCG capsule Take 0.5 mcg by mouth daily.      . carvedilol (COREG) 25 MG tablet Take 37.5 mg by mouth 2 (two) times daily with a meal.      . docusate sodium (COLACE) 250 MG capsule Take 1 capsule (250 mg total) by mouth 2 (two) times daily.  60 capsule  1  . epoetin alfa (EPOGEN,PROCRIT) 40981 UNIT/ML injection Inject 20,000 Units into the skin once. Last dose in the past month.      . escitalopram (LEXAPRO) 10 MG tablet Take 10 mg by mouth daily.        . fentaNYL (DURAGESIC - DOSED MCG/HR) 100 MCG/HR  Place 1 patch onto the skin every 3 (three) days.      . furosemide (LASIX) 80 MG tablet Take 1 tablet (80 mg total) by mouth daily. HOLD THIS MEDICATION UNTIL YOU FOLLOW WITH NEPHROLOGIST      . HUMULIN R 100 UNIT/ML injection       . hydrALAZINE (APRESOLINE) 50 MG tablet Take 50 mg by mouth 2 (two) times daily.       . insulin aspart (NOVOLOG) 100 UNIT/ML injection Inject 20 Units into the skin 3 (three) times daily before meals.      . insulin detemir (LEVEMIR) 100 UNIT/ML injection Inject 50 Units into the skin at bedtime.       . metoCLOPramide (REGLAN) 10 MG tablet Take 1 tablet (10 mg total) by mouth 3 (three) times daily as needed (for nausea). (Orally disintegrating Tablets)  120 tablet  1  . nitroGLYCERIN (NITROSTAT) 0.4 MG SL tablet Place 1 tablet (0.4 mg total) under the tongue every 5 (five) minutes as needed for chest pain.  25 tablet  3  . ondansetron (ZOFRAN ODT) 8 MG disintegrating tablet Take 1 tablet (8 mg total) by mouth every 8 (eight) hours as needed for nausea.  20 tablet  1  . pantoprazole (PROTONIX) 20 MG tablet Take 1 tablet (20 mg total) by mouth daily.  30 tablet  1  . polyethylene glycol (MIRALAX / GLYCOLAX) packet Take 17 g by mouth daily.  14 each    . rosuvastatin (CRESTOR) 40 MG tablet Take 1 tablet (40 mg total) by mouth daily.  30 tablet  11  . sodium bicarbonate 650 MG tablet Take 1 tablet (650 mg total) by mouth 2 (two) times daily.  60 tablet  0  . telmisartan (MICARDIS) 80 MG tablet Take 1 tablet (80 mg total) by mouth daily. HOLD UNTIL FOLLOW UP WITH NEPHROLOGIST       No current facility-administered medications for this visit.    Review of Systems Review of Systems  Constitutional: Positive for fatigue. Negative for fever, chills and unexpected weight change.  HENT: Negative for hearing loss, congestion, sore throat, trouble swallowing and voice change.   Eyes: Negative for visual disturbance.  Respiratory: Negative for cough and wheezing.    Cardiovascular: Negative for chest pain, palpitations and leg swelling.  Gastrointestinal: Positive for nausea, vomiting and constipation. Negative for abdominal pain, diarrhea, blood in stool, abdominal distention, anal bleeding and rectal pain.  Genitourinary: Negative for hematuria and difficulty urinating.  Musculoskeletal: Negative for arthralgias.  Skin: Positive for color change and pallor. Negative for rash and wound.  Neurological: Positive for numbness. Negative for seizures, syncope, weakness and headaches.  Hematological: Negative for adenopathy. Does not bruise/bleed easily.  Psychiatric/Behavioral: Negative for confusion.    Blood pressure 110/58, pulse 64, temperature 97.2 F (36.2 C), temperature source Temporal, resp. rate 20, height 5\' 8"  (1.727 m), weight 179 lb (81.194 kg).  Physical Exam Physical Exam  Constitutional: He is oriented to person, place, and time. He appears well-developed and well-nourished. No distress.  HENT:  Head: Normocephalic.  Nose: Nose normal.  Mouth/Throat: No oropharyngeal exudate.  Eyes: Conjunctivae and EOM are normal. Pupils are equal, round, and reactive to light. Right eye exhibits no discharge. Left eye exhibits no discharge. No scleral icterus.  Neck: Normal range of motion. Neck supple. No JVD present. No tracheal deviation present. No thyromegaly present.  Cardiovascular: Normal rate, regular rhythm, normal heart sounds and intact distal pulses.   No murmur heard. Pulmonary/Chest: Effort normal and breath sounds normal. No stridor. No respiratory distress. He has no wheezes. He has no rales. He exhibits no tenderness.  Abdominal: Soft. Bowel sounds are normal. He exhibits no distension and no mass. There is no tenderness. There is no rebound and no guarding.  Some streaky noted. No scars. No palpable mass.  Musculoskeletal: Normal range of motion. He exhibits no edema and no tenderness.  Lymphadenopathy:    He has no cervical  adenopathy.  Neurological: He is alert and oriented to person, place, and time. He has normal reflexes. Coordination normal.  Skin: Skin is warm and dry. No rash noted. He is not diaphoretic. No erythema. There is pallor.  Grayish skin color consistent with uremia.  Psychiatric: He has a normal mood and affect. His behavior is normal. Judgment and thought content normal.    Data Reviewed Numerous hospital records. All imaging studies. Dr. Randa Evens office notes. Phone conversation with Dr. Randa Evens.  Assessment    Intractable nausea and vomiting. Now resolved. Gastroparesis seems to be the most likely culprit here. Biliary tract disease is always possible in the patient with sludge and diabetes, but the history isn't consistent and the radiographic workup was nondiagnostic. I do not think that he would benefit from cholecystectomy at this time.  Insulin-dependent diabetes mellitus  Chronic renal failure with impending dialysis  Hypertension  Coronary artery disease, status post multiple cardiac catheterizations with stent placements. Stable  Peripheral neuropathy, on fentanyl patch  .   Plan    The patient was advised and agrees that no surgical intervention is warranted at this time.  He will contact me if symptoms recur or biliary tract issues become more obvious.        Angelia Mould. Derrell Lolling, M.D., [facility] General and Minimally invasive Surgery Breast and Colorectal Surgery Office:   [phone] Pager:   [phone]  11/27/2012, 9:32 AM

## 2012-11-27 NOTE — Patient Instructions (Signed)
The diagnosis of gallbladder disease is not clear. Since your nausea and vomiting has now resolved and you do not have any abdominal pain, there does not seem to be any indication for cholecystectomy. You might develop gallbladder problems in the future, but you might not.  Please call me if further problems arise.

## 2012-11-30 ENCOUNTER — Ambulatory Visit: Payer: Self-pay | Admitting: Vascular Surgery

## 2012-11-30 LAB — CBC
HCT: 33 % — ABNORMAL LOW (ref 40.0–52.0)
MCH: 29.4 pg (ref 26.0–34.0)
MCHC: 32.6 g/dL (ref 32.0–36.0)
Platelet: 263 10*3/uL (ref 150–440)
RDW: 16.9 % — ABNORMAL HIGH (ref 11.5–14.5)

## 2012-11-30 LAB — BASIC METABOLIC PANEL
Chloride: 103 mmol/L (ref 98–107)
Co2: 26 mmol/L (ref 21–32)
Creatinine: 4.96 mg/dL — ABNORMAL HIGH (ref 0.60–1.30)
EGFR (African American): 14 — ABNORMAL LOW
EGFR (Non-African Amer.): 12 — ABNORMAL LOW
Glucose: 114 mg/dL — ABNORMAL HIGH (ref 65–99)
Osmolality: 287 (ref 275–301)
Potassium: 4.5 mmol/L (ref 3.5–5.1)
Sodium: 137 mmol/L (ref 136–145)

## 2012-12-05 ENCOUNTER — Encounter (HOSPITAL_COMMUNITY)
Admission: RE | Admit: 2012-12-05 | Discharge: 2012-12-05 | Disposition: A | Discharge status: Home / Self Care | Payer: Medicare Other | Source: Ambulatory Visit | Attending: Nephrology | Admitting: Nephrology

## 2012-12-05 LAB — FERRITIN: Ferritin: 543 ng/mL — ABNORMAL HIGH (ref 22–322)

## 2012-12-05 LAB — IRON AND TIBC
Iron: 77 ug/dL (ref 42–135)
Saturation Ratios: 32 % (ref 20–55)
TIBC: 244 ug/dL (ref 215–435)
UIBC: 167 ug/dL (ref 125–400)

## 2012-12-05 MED ORDER — EPOETIN ALFA 20000 UNIT/ML IJ SOLN
INTRAMUSCULAR | Status: AC
  Filled 2012-12-05: qty 1

## 2012-12-05 MED ORDER — EPOETIN ALFA 20000 UNIT/ML IJ SOLN
20000.0000 [IU] | INTRAMUSCULAR | Status: DC
  Administered 2012-12-05: 20000 [IU] via SUBCUTANEOUS

## 2012-12-07 ENCOUNTER — Ambulatory Visit: Payer: Self-pay | Admitting: Vascular Surgery

## 2012-12-13 ENCOUNTER — Encounter: Payer: Self-pay | Admitting: Neurology

## 2012-12-13 ENCOUNTER — Ambulatory Visit (INDEPENDENT_AMBULATORY_CARE_PROVIDER_SITE_OTHER): Payer: Medicare Other | Admitting: Neurology

## 2012-12-13 VITALS — BP 98/54 | HR 60 | Ht 67.75 in | Wt 166.0 lb

## 2012-12-13 DIAGNOSIS — G6181 Chronic inflammatory demyelinating polyneuritis: Secondary | ICD-10-CM

## 2012-12-13 DIAGNOSIS — E1142 Type 2 diabetes mellitus with diabetic polyneuropathy: Secondary | ICD-10-CM

## 2012-12-13 HISTORY — DX: Type 2 diabetes mellitus with diabetic polyneuropathy: E11.42

## 2012-12-13 MED ORDER — IMMUNE GLOBULIN (HUMAN) 10 GM/100ML IV SOLN: 400.0000 mg/kg | Freq: Every day | INTRAVENOUS | Status: AC

## 2012-12-13 NOTE — Progress Notes (Signed)
Reason for visit: Peripheral neuropathy  Daniel Anthony is a 62 y.o. 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 The patient has a history of a significant diabetic peripheral neuropathy that has been present for several years. The patient was last seen through this office in 2006. The patient has had problems with proximal leg weakness in the past, and he was treated with IVIG with some improvement. The patient has been stable with his neuropathy over many years, but within the last 4 months, the patient has developed severe weakness of the triceps muscle on the left in isolation, unassociated with pain. The patient also developed progressive weakness proximally in both legs that began 2 months ago, again without an increase in pain. The patient does have discomfort with the diabetic neuropathy, and he is on a fentanyl patch for this, and he has been on this for 10 years. The patient does have some neck discomfort, and some low back pain. The patient denies any pain radiating down from the neck or back into the extremities. The patient denies problems controlling the bladder, but he does have some constipation issues. The patient walks with a cane, but he denies any falls. The patient has numbness in the feet greater than the hands. The patient comes to this office for an evaluation. In the past, the patient was felt possibly to have CIDP.  Past Medical History  Diagnosis Date  . Bradycardia     Hx of  . Pleural effusion, right   . Kidney disease   . Hypomagnesemia   . Hypertension   . Diabetes mellitus   . Hyperlipidemia   . Coronary artery disease   . 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 MI (myocardial infarction) 1995    anterior  . CIDP (chronic inflammatory demyelinating polyneuropathy)   . Polyneuropathy in diabetes(357.2) 12/13/2012  . Abnormality of gait      Past Surgical History  Procedure Laterality Date  . Tonsillectomy    . Cardiac catheterization  1995    2 stents   . Coronary angioplasty with stent placement  1995  . Lithotripsy      Family History  Problem Relation Age of Onset  . Heart attack Father   . Hypertension Father   . Diabetes Father   . Cancer Father     prostate  . Hypertension Mother   . Cancer Maternal Grandmother     colon    Social history:  reports that he quit smoking about 33 years ago. His smoking use included Cigarettes. He smoked 0.00 packs per day. He has never used smokeless tobacco. He reports that he does not drink alcohol or use illicit drugs.  Medications:  Current Outpatient Prescriptions on File Prior to Visit  Medication Sig Dispense Refill  . amLODipine (NORVASC) 10 MG tablet Take 1 tablet (10 mg total) by mouth daily.  30 tablet  1  . aspirin 325 MG EC tablet Take 325 mg by mouth daily.        . calcitRIOL (ROCALTROL) 0.25 MCG capsule Take 0.5 mcg by mouth daily.      . carvedilol (COREG) 25 MG tablet Take 37.5 mg by mouth 2 (two) times daily with a meal.      . docusate sodium (COLACE) 250 MG capsule Take 1 capsule (250 mg total) by mouth 2 (two) times daily.  60 capsule  1  . epoetin alfa (EPOGEN,PROCRIT) 16109 UNIT/ML injection Inject 20,000 Units into the skin once. Last dose in the past month.      . escitalopram (LEXAPRO) 10 MG tablet Take 10 mg by mouth daily.        . fentaNYL (DURAGESIC - DOSED MCG/HR) 100 MCG/HR Place 1 patch onto the skin every 3 (three) days.      Marland Kitchen HUMULIN R 100 UNIT/ML injection       . hydrALAZINE (APRESOLINE) 50 MG tablet Take 50 mg by mouth 2 (two) times daily.       . insulin aspart (NOVOLOG) 100 UNIT/ML injection Inject 15 Units into the skin 3 (three) times daily before meals.       . insulin detemir (LEVEMIR) 100 UNIT/ML injection Inject 40 Units into the skin at bedtime.       . polyethylene glycol (MIRALAX / GLYCOLAX) packet Take 17 g by mouth  daily.  14 each    . rosuvastatin (CRESTOR) 40 MG tablet Take 1 tablet (40 mg total) by mouth daily.  30 tablet  11   No current facility-administered medications on file prior to visit.    Allergies: No Known Allergies  ROS:  Out of a complete 14 system review of symptoms, the patient complains only of the following symptoms, and all other reviewed systems are negative.  Weight loss, fatigue Anemia Feeling cold Achy muscles Memory loss, numbness, weakness Tremor, anxiety  Blood pressure 98/54, pulse 60, height 5' 7.75" (1.721 m), weight 166 lb (75.297 kg).  Physical Exam  General: The patient is alert and cooperative at the time of the examination.  Head: Pupils are equal, round, and reactive to light. Discs are flat bilaterally.  Neck: The neck is supple, no carotid bruits are noted.  Respiratory: The respiratory examination is clear.  Cardiovascular: The cardiovascular examination reveals a regular rate and rhythm, no obvious murmurs or rubs are noted.  Skin: Extremities are without significant edema.  Neurologic Exam  Mental status:  Cranial nerves: Facial symmetry is present. There is good sensation of the face to pinprick and soft touch bilaterally. The strength of the facial muscles and the muscles to head turning and shoulder shrug are normal bilaterally. Speech is well enunciated, no aphasia or dysarthria is noted. Extraocular movements are full. Visual fields are full.  Motor: The motor testing reveals 5 over 5 strength of all 4 extremities, with the exception of 4/5 strength of the intrinsic muscles of the hands bilaterally, and severe weakness of the left triceps muscle. Mild weakness with supination is seen on the left, and 4+ over 5 strength is seen proximally in the legs bilaterally, with the left leg being slightly weaker. The patient is able to walk on the toes, and he cannot walk on heels on either side. Good symmetric motor tone is noted throughout. Atrophy  of the first dorsal interossei are seen bilaterally.  Sensory: Sensory testing is intact to pinprick, soft touch, vibration sensation, and position sense on the upper extremities. With the lower extremities, no definite pinprick stocking sensory deficit is noted. Vibration sensation is severely impaired, as is position sense in both feet. No evidence of extinction is noted.  Coordination: Cerebellar testing reveals mild dysmetria on finger-nose-finger and heel-to-shin bilaterally.  Gait and station: Gait is wide-based, slightly unsteady. The patient uses a cane for ambulation. Tandem gait is slightly unsteady. Romberg is negative. No drift is seen.  Reflexes: Deep tendon reflexes are symmetric, but the  biceps reflexes are present, triceps reflexes are absent. Knee jerk reflexes are present, ankle jerk reflexes are absent. Toes are downgoing bilaterally.   Assessment/Plan:  1. Diabetes  2. Diabetic peripheral neuropathy  3. Progressive proximal leg weakness, left triceps weakness  4. Chronic renal insufficiency, hemodialysis possible in the near future  Clinical examination today reveals that he has well-maintained biceps reflexes, absent triceps reflexes bilaterally and well-maintained knee jerk reflexes. This clinical examination is not consistent with CIDP. 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 The patient does not have a full left radial neuropathy, but he has isolated weakness of the left triceps muscle. The patient will be set up for nerve conduction studies all 4 extremities. A cervical spine MRI study will be considered. The patient may benefit from IVIG, and treatment in this regard will be set up over the next 3-4 months. The patient will followup in 3 months.  Addendum: I discussed the use of IVIG with Dr. Lowell Guitar. No contraindications with the CRI. I will order the initial 5 day of therapy, and then we will  treat once a month for 4 or 5 months.  Marlan Palau MD 12/13/2012 6:47 PM  [facility] [address] [place], [place] [address]  Phone [phone] Fax [phone]

## 2012-12-19 ENCOUNTER — Encounter (HOSPITAL_COMMUNITY): Payer: Medicare Other

## 2012-12-21 ENCOUNTER — Ambulatory Visit (INDEPENDENT_AMBULATORY_CARE_PROVIDER_SITE_OTHER): Payer: Medicare Other

## 2012-12-21 ENCOUNTER — Telehealth: Payer: Self-pay | Admitting: Neurology

## 2012-12-21 DIAGNOSIS — G6181 Chronic inflammatory demyelinating polyneuritis: Secondary | ICD-10-CM

## 2012-12-21 DIAGNOSIS — E1142 Type 2 diabetes mellitus with diabetic polyneuropathy: Secondary | ICD-10-CM

## 2012-12-21 NOTE — Telephone Encounter (Signed)
I called the patient. The nerve conduction studies show some progression from May of 2006. I have once again recommended getting a cervical spine MRI given the fact that his knee jerk reflexes are brisk. The patient likely does not have CIDP because of the presence of reflexes. The patient will be set up for IVIG. I am not sure what happened to the prior orders, these were placed on the day that I saw him in the office, but apparently, the orders never went through. I will try again to get the medication started.

## 2012-12-21 NOTE — Procedures (Signed)
  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 He has a history of possible CIDP, and he has been treated with IVIG in the past. The patient reports a several month history of worsening weakness in the legs and of the left arm, particularly affecting the triceps muscle. The patient is being evaluated for a progressive neuropathy.  NERVE CONDUCTION STUDIES:  Nerve conduction studies were performed on all 4 extremities. The distal motor latencies for the median nerves were prolonged bilaterally, with a low motor amplitude on the left, normal on the right. The distal motor latencies for the ulnar nerves were prolonged bilaterally, with low motor amplitudes for these nerves bilaterally. The F wave latency for the right median nerve was prolonged, absent on the left. The F wave latency for the right ulnar nerve was prolonged, absent on the left. Nerve conduction velocities for the median nerves were normal bilaterally, significantly slowed for the ulnar nerves bilaterally. The sensory latencies for the median, ulnar, and radial nerves were absent bilaterally.  Studies of the peroneal and posterior tibial nerves bilaterally showed no response. The peroneal sensory latencies were unobtainable bilaterally, and the H reflex latencies were absent bilaterally.  EMG STUDIES:  EMG study was not performed.  IMPRESSION:  Nerve conduction studies done on all 4 extremities shows evidence of a severe peripheral neuropathy with mixed axonal and demyelinating features. The diagnosis of CIDP cannot be confirmed on this study alone, as diabetes can produce similar findings. Clinical correlation is required. As compared to a prior nerve conduction study done on 12/15/2004, there has been progression in the peripheral neuropathy on this evaluation.  Daniel Palau MD 12/21/2012 10:57 AM  [facility] [address] [place], [place] [address]  Phone [phone] Fax [phone]

## 2012-12-26 ENCOUNTER — Inpatient Hospital Stay (HOSPITAL_COMMUNITY): Admission: RE | Admit: 2012-12-26 | Payer: Medicare Other | Source: Ambulatory Visit

## 2012-12-26 ENCOUNTER — Telehealth: Payer: Self-pay

## 2012-12-26 NOTE — Telephone Encounter (Signed)
Patient called clinic, left message.  Said there was some confusion about his IVIG therapy.  He would like to verify that everything has been set up and approved by Ins.  Says he thought he was going to [facility] for the therapy, but believes it is now through [facility].  As well, he says Dr Anne Hahn suggested he get an MRI, and he would like to know how to proceed in getting that set up.  Says he should be available for an appt (MRI) anytime after next week, no rush on the appt.  Call back number [phone].  Thank you.

## 2012-12-27 ENCOUNTER — Other Ambulatory Visit: Payer: Self-pay | Admitting: Neurology

## 2012-12-27 DIAGNOSIS — R269 Unspecified abnormalities of gait and mobility: Secondary | ICD-10-CM

## 2012-12-27 DIAGNOSIS — R531 Weakness: Secondary | ICD-10-CM

## 2012-12-27 NOTE — Telephone Encounter (Signed)
I called pt and let him know that I spoke with Dr. Anne Hahn and he will place order for MRI C spine.  Authorization is done and he will be called by the radiolology facility to schedule.  I also explained that HH IVIG was ordered by Dr. Anne Hahn (12-21-12 note).  [employer] has contacted the pt and is working on the pre determination/ authorization for the IVIG, this can take up to 10 days to 2 wks.  Pt verbalized understanding.

## 2012-12-28 ENCOUNTER — Ambulatory Visit: Payer: Self-pay | Admitting: Vascular Surgery

## 2013-01-01 ENCOUNTER — Telehealth: Payer: Self-pay | Admitting: *Deleted

## 2013-01-01 ENCOUNTER — Other Ambulatory Visit (HOSPITAL_COMMUNITY): Payer: Self-pay | Admitting: *Deleted

## 2013-01-01 ENCOUNTER — Other Ambulatory Visit: Payer: Self-pay | Admitting: Neurology

## 2013-01-01 DIAGNOSIS — G6181 Chronic inflammatory demyelinating polyneuritis: Secondary | ICD-10-CM

## 2013-01-01 NOTE — Telephone Encounter (Signed)
I will try to get this set up for Short stay at [facility].

## 2013-01-01 NOTE — Telephone Encounter (Signed)
Patient would like to cancel the order for IVIG for [facility] and instead would like to the treatment done over at  [facility].

## 2013-01-02 ENCOUNTER — Encounter (HOSPITAL_COMMUNITY): Payer: Medicare Other

## 2013-01-04 ENCOUNTER — Encounter (HOSPITAL_COMMUNITY)
Admission: RE | Admit: 2013-01-04 | Discharge: 2013-01-04 | Disposition: A | Discharge status: Home / Self Care | Payer: Medicare Other | Source: Ambulatory Visit | Attending: Nephrology | Admitting: Nephrology

## 2013-01-04 ENCOUNTER — Other Ambulatory Visit: Payer: Self-pay | Admitting: Neurology

## 2013-01-04 VITALS — BP 137/58 | HR 71 | Temp 98.2°F | Resp 18 | Ht 68.0 in | Wt 165.0 lb

## 2013-01-04 DIAGNOSIS — G6181 Chronic inflammatory demyelinating polyneuritis: Secondary | ICD-10-CM | POA: Insufficient documentation

## 2013-01-04 LAB — FERRITIN: Ferritin: 578 ng/mL — ABNORMAL HIGH (ref 22–322)

## 2013-01-04 LAB — POCT HEMOGLOBIN-HEMACUE: Hemoglobin: 9.7 g/dL — ABNORMAL LOW (ref 13.0–17.0)

## 2013-01-04 LAB — GLUCOSE, CAPILLARY: Glucose-Capillary: 171 mg/dL — ABNORMAL HIGH (ref 70–99)

## 2013-01-04 MED ORDER — EPOETIN ALFA 20000 UNIT/ML IJ SOLN
INTRAMUSCULAR | Status: AC
  Administered 2013-01-04: 20000 [IU] via SUBCUTANEOUS
  Filled 2013-01-04: qty 1

## 2013-01-04 MED ORDER — IMMUNE GLOBULIN (HUMAN) 10 GM/100ML IV SOLN
400.0000 mg/kg | Freq: Once | INTRAVENOUS | Status: AC
  Administered 2013-01-04: 30 g via INTRAVENOUS
  Filled 2013-01-04: qty 300

## 2013-01-04 MED ORDER — EPINEPHRINE 0.3 MG/0.3ML IJ SOAJ
0.3000 mg | INTRAMUSCULAR | Status: DC
  Filled 2013-01-04: qty 0.6

## 2013-01-04 MED ORDER — EPOETIN ALFA 20000 UNIT/ML IJ SOLN: 20000.0000 [IU] | INTRAMUSCULAR | Status: DC

## 2013-01-04 MED ORDER — HEPARIN SODIUM (PORCINE) 1000 UNIT/ML IJ SOLN
2200.0000 [IU] | Freq: Once | INTRAMUSCULAR | Status: AC
  Administered 2013-01-04: 2200 [IU] via INTRAVENOUS
  Filled 2013-01-04: qty 2.2

## 2013-01-04 MED ORDER — DIPHENHYDRAMINE HCL 25 MG PO TABS
50.0000 mg | ORAL_TABLET | Freq: Once | ORAL | Status: DC
  Filled 2013-01-04: qty 2

## 2013-01-04 MED ORDER — ONDANSETRON HCL 4 MG/2ML IJ SOLN: 4.0000 mg | Freq: Four times a day (QID) | INTRAMUSCULAR | Status: DC | PRN

## 2013-01-04 MED ORDER — ACETAMINOPHEN 325 MG PO TABS: 650.0000 mg | ORAL_TABLET | ORAL | Status: DC

## 2013-01-04 MED ORDER — DEXTROSE 5 % IV SOLN
INTRAVENOUS | Status: DC
  Administered 2013-01-04: 250 mL via INTRAVENOUS

## 2013-01-04 MED ORDER — ONDANSETRON HCL 4 MG/2ML IJ SOLN
INTRAMUSCULAR | Status: AC
  Filled 2013-01-04: qty 2

## 2013-01-04 NOTE — Progress Notes (Addendum)
1130 Complains of nausea after the last infusion increase to 299 cc per hour.  Infusion decreased to 149 cc per hour and Dr Anne Hahn advised who agreed with the decrease.  Order received for zofran iv.  CBG 170.  No further orders    1200 Continues to complain of nausea 1230 states nausea has subsided 1255 IV therapy paged to de access dialysis catheter

## 2013-01-05 MED FILL — Ondansetron HCl Inj 4 MG/2ML (2 MG/ML): INTRAMUSCULAR | Qty: 2 | Status: AC

## 2013-01-16 ENCOUNTER — Telehealth: Payer: Self-pay | Admitting: *Deleted

## 2013-01-16 NOTE — Telephone Encounter (Signed)
I called and spoke to [place] in [facility] about IVIG infusion.  Pt had been approved thru [employer] [facility] at [facility] but pt preferred to go to [facility] since had central line and HH could not access this. (he was also getting procrit infusion, and takes dialysis MWF).  Pt had first dose IVIG 01-04-13, next one scheduled 01-25-13.  Authorization [employer] 01-16-13 thru 04-16-13 [phone], not guarantee of pt.  I called and spoke to wife and she confirmed 01-04-13 treatment, she would look thru [facility] papers that she has.  I relayed the authorization that I had from [facility] starting today.

## 2013-01-16 NOTE — Telephone Encounter (Signed)
Documentation of authorization noted.

## 2013-01-18 ENCOUNTER — Ambulatory Visit: Payer: Self-pay | Admitting: Vascular Surgery

## 2013-01-18 LAB — POTASSIUM: Potassium: 3 mmol/L — ABNORMAL LOW (ref 3.5–5.1)

## 2013-01-24 ENCOUNTER — Other Ambulatory Visit: Payer: Self-pay | Admitting: Neurology

## 2013-01-24 DIAGNOSIS — G6181 Chronic inflammatory demyelinating polyneuritis: Secondary | ICD-10-CM

## 2013-01-25 ENCOUNTER — Telehealth: Payer: Self-pay | Admitting: *Deleted

## 2013-01-25 ENCOUNTER — Encounter (HOSPITAL_COMMUNITY)
Admission: RE | Admit: 2013-01-25 | Discharge: 2013-01-25 | Disposition: A | Discharge status: Home / Self Care | Payer: Medicare Other | Source: Ambulatory Visit | Attending: Nephrology | Admitting: Nephrology

## 2013-01-25 VITALS — BP 150/58 | HR 64 | Temp 98.5°F | Resp 18 | Ht 68.0 in | Wt 170.0 lb

## 2013-01-25 DIAGNOSIS — G6181 Chronic inflammatory demyelinating polyneuritis: Secondary | ICD-10-CM | POA: Insufficient documentation

## 2013-01-25 MED ORDER — IMMUNE GLOBULIN HUMAN NICU IV SYRINGE 100 MG/ML: 400.0000 mg/kg | Freq: Once | INTRAMUSCULAR | Status: DC

## 2013-01-25 MED ORDER — ACETAMINOPHEN 325 MG PO TABS: 650.0000 mg | ORAL_TABLET | ORAL | Status: DC

## 2013-01-25 MED ORDER — IMMUNE GLOBULIN (HUMAN) 10 GM/100ML IV SOLN
400.0000 mg/kg | Freq: Once | INTRAVENOUS | Status: AC
  Administered 2013-01-25: 30 g via INTRAVENOUS
  Filled 2013-01-25: qty 300

## 2013-01-25 MED ORDER — DIPHENHYDRAMINE HCL 25 MG PO CAPS: 50.0000 mg | ORAL_CAPSULE | Freq: Once | ORAL | Status: DC

## 2013-01-25 MED ORDER — SODIUM CHLORIDE 0.9 % IV SOLN
500.0000 mg | INTRAVENOUS | Status: DC
  Filled 2013-01-25 (×2): qty 4

## 2013-01-25 NOTE — Telephone Encounter (Signed)
I called and spoke to [facility] after she called about clarification on solumedrol 500mg  as a premed.   I consulted Dr. Anne Hahn, and he stated this is only prn basis for allergic reaction.   I relayed this to [place].  She verbalized understanding.

## 2013-01-25 NOTE — Progress Notes (Signed)
Accessed right chest HD cath - pulled off 8ml blood from blue port, flushed with 10ml NS and d5w started at 15ml/ hour - RN aware.

## 2013-01-25 NOTE — Progress Notes (Signed)
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 I called Dr. Anne Hahn' office for clarification. Awaiting call back.

## 2013-01-25 NOTE — Progress Notes (Signed)
Right chest HD cath blue port flushed with 10ml NS and 2.2 ml 1000unit/19ml Heparin per protocol and capped/taped. RN aware

## 2013-02-01 ENCOUNTER — Ambulatory Visit
Admission: RE | Admit: 2013-02-01 | Discharge: 2013-02-01 | Disposition: A | Discharge status: Home / Self Care | Payer: PRIVATE HEALTH INSURANCE | Source: Ambulatory Visit | Attending: Neurology | Admitting: Neurology

## 2013-02-01 DIAGNOSIS — R531 Weakness: Secondary | ICD-10-CM

## 2013-02-01 DIAGNOSIS — R269 Unspecified abnormalities of gait and mobility: Secondary | ICD-10-CM

## 2013-02-01 DIAGNOSIS — M6281 Muscle weakness (generalized): Secondary | ICD-10-CM

## 2013-02-02 ENCOUNTER — Telehealth: Payer: Self-pay | Admitting: Neurology

## 2013-02-02 NOTE — Telephone Encounter (Signed)
I called the patient. The MRI of the cervical spinal cord does not show impingement of the cord. There is spondylitic changes at the C5-6 level, with possible neuroforaminal stenosis at this level.

## 2013-02-14 ENCOUNTER — Telehealth: Payer: Self-pay | Admitting: *Deleted

## 2013-02-14 ENCOUNTER — Other Ambulatory Visit: Payer: Self-pay | Admitting: Neurology

## 2013-02-14 DIAGNOSIS — G6181 Chronic inflammatory demyelinating polyneuritis: Secondary | ICD-10-CM

## 2013-02-14 NOTE — Telephone Encounter (Signed)
I'll write the orders.

## 2013-02-14 NOTE — Telephone Encounter (Signed)
Calling for orders for IVIG for pt scheduled 0900 02-15-13.

## 2013-02-15 ENCOUNTER — Telehealth: Payer: Self-pay | Admitting: *Deleted

## 2013-02-15 ENCOUNTER — Other Ambulatory Visit: Payer: Self-pay | Admitting: Neurology

## 2013-02-15 ENCOUNTER — Encounter (HOSPITAL_COMMUNITY)
Admission: RE | Admit: 2013-02-15 | Discharge: 2013-02-15 | Disposition: A | Discharge status: Home / Self Care | Payer: Medicare Other | Source: Ambulatory Visit | Attending: Nephrology | Admitting: Nephrology

## 2013-02-15 VITALS — BP 176/76 | HR 64 | Temp 98.3°F | Resp 18 | Ht 68.0 in | Wt 170.0 lb

## 2013-02-15 DIAGNOSIS — G6181 Chronic inflammatory demyelinating polyneuritis: Secondary | ICD-10-CM | POA: Insufficient documentation

## 2013-02-15 MED ORDER — EPINEPHRINE 0.3 MG/0.3ML IJ SOAJ: 0.3000 mg | INTRAMUSCULAR | Status: DC

## 2013-02-15 MED ORDER — ACETAMINOPHEN 325 MG PO TABS: 650.0000 mg | ORAL_TABLET | ORAL | Status: DC

## 2013-02-15 MED ORDER — DIPHENHYDRAMINE HCL 25 MG PO CAPS: 50.0000 mg | ORAL_CAPSULE | ORAL | Status: DC

## 2013-02-15 MED ORDER — IMMUNE GLOBULIN (HUMAN) 10 GM/100ML IV SOLN
400.0000 mg/kg | INTRAVENOUS | Status: DC
  Administered 2013-02-15: 30 g via INTRAVENOUS
  Filled 2013-02-15: qty 300

## 2013-02-15 NOTE — Telephone Encounter (Signed)
Cathy called and order for IVIG placed yesterday was for Octogam 5% .  Pt had Privigen 10% last time.  He is fluid restricted and having dialysis.  Please clarify. Thanks.

## 2013-02-15 NOTE — Telephone Encounter (Signed)
The order has been changed to Privigen, and the patient will be getting 400 mg per kilogram per day once every 28 days.

## 2013-03-08 ENCOUNTER — Inpatient Hospital Stay (HOSPITAL_COMMUNITY): Admission: RE | Admit: 2013-03-08 | Payer: PRIVATE HEALTH INSURANCE | Source: Ambulatory Visit

## 2013-03-12 ENCOUNTER — Other Ambulatory Visit (HOSPITAL_COMMUNITY): Payer: Self-pay | Admitting: *Deleted

## 2013-03-13 ENCOUNTER — Encounter (HOSPITAL_COMMUNITY)
Admission: RE | Admit: 2013-03-13 | Discharge: 2013-03-13 | Disposition: A | Discharge status: Home / Self Care | Payer: Medicare Other | Source: Ambulatory Visit | Attending: Neurology | Admitting: Neurology

## 2013-03-13 ENCOUNTER — Telehealth: Payer: Self-pay | Admitting: Neurology

## 2013-03-13 VITALS — BP 180/69 | HR 66 | Temp 98.7°F | Resp 20 | Ht 68.0 in | Wt 169.0 lb

## 2013-03-13 DIAGNOSIS — G6181 Chronic inflammatory demyelinating polyneuritis: Secondary | ICD-10-CM

## 2013-03-13 MED ORDER — IMMUNE GLOBULIN (HUMAN) 10 GM/100ML IV SOLN
30.0000 g | INTRAVENOUS | Status: DC
  Administered 2013-03-13: 30 g via INTRAVENOUS
  Filled 2013-03-13: qty 300

## 2013-03-13 MED ORDER — EPINEPHRINE 0.3 MG/0.3ML IJ SOAJ
0.3000 mg | INTRAMUSCULAR | Status: DC
  Filled 2013-03-13: qty 0.6

## 2013-03-13 MED ORDER — IMMUNE GLOBULIN (HUMAN) 10 GM/200ML IV SOLN: 400.0000 mg/kg | INTRAVENOUS | Status: DC

## 2013-03-13 NOTE — Telephone Encounter (Signed)
Agree with orders given.

## 2013-03-13 NOTE — Telephone Encounter (Signed)
The nurse from short stay called to say the patient was there for his IVIG.  The doctor ordered Octagam, but the previous infusion in June the patient had Privigen with no reaction.  She wanted clarification from doctor of which IVIG they should infuse.  I spoke to doctor who gave verbal order that it was OK to give Privigen since that is what patient had been receiving.

## 2013-03-13 NOTE — Progress Notes (Signed)
Patient took tylenol 1 gm and benadryl 50 mgm at home this am at [facility]

## 2013-03-29 ENCOUNTER — Ambulatory Visit: Payer: Self-pay | Admitting: Vascular Surgery

## 2013-04-11 ENCOUNTER — Other Ambulatory Visit: Payer: Self-pay | Admitting: Neurology

## 2013-04-11 ENCOUNTER — Telehealth: Payer: Self-pay | Admitting: *Deleted

## 2013-04-11 DIAGNOSIS — G6181 Chronic inflammatory demyelinating polyneuritis: Secondary | ICD-10-CM

## 2013-04-11 NOTE — Telephone Encounter (Signed)
Daniel Anthony with [facility] [phone] calling for pt is scheduled for IVIG tomorrow at 1100 and need orders placed.  thanks

## 2013-04-11 NOTE — Telephone Encounter (Signed)
I will put orders in for Privigen.

## 2013-04-12 ENCOUNTER — Encounter (HOSPITAL_COMMUNITY)
Admission: RE | Admit: 2013-04-12 | Discharge: 2013-04-12 | Disposition: A | Discharge status: Home / Self Care | Payer: Medicare Other | Source: Ambulatory Visit | Attending: Nephrology | Admitting: Nephrology

## 2013-04-12 ENCOUNTER — Telehealth: Payer: Self-pay | Admitting: *Deleted

## 2013-04-12 VITALS — BP 155/71 | HR 64 | Temp 97.9°F | Resp 20 | Ht 68.0 in | Wt 170.0 lb

## 2013-04-12 DIAGNOSIS — G6181 Chronic inflammatory demyelinating polyneuritis: Secondary | ICD-10-CM | POA: Insufficient documentation

## 2013-04-12 MED ORDER — IMMUNE GLOBULIN (HUMAN) 10 GM/100ML IV SOLN
400.0000 mg/kg | INTRAVENOUS | Status: DC
  Administered 2013-04-12: 30 g via INTRAVENOUS
  Filled 2013-04-12: qty 300

## 2013-04-12 MED ORDER — DIPHENHYDRAMINE HCL 50 MG/ML IJ SOLN
INTRAMUSCULAR | Status: AC
  Filled 2013-04-12: qty 1

## 2013-04-12 MED ORDER — ACETAMINOPHEN 325 MG PO TABS: 650.0000 mg | ORAL_TABLET | ORAL | Status: DC

## 2013-04-12 MED ORDER — EPINEPHRINE 0.3 MG/0.3ML IJ SOAJ
0.3000 mg | INTRAMUSCULAR | Status: DC
  Filled 2013-04-12: qty 0.6

## 2013-04-12 MED ORDER — SODIUM CHLORIDE 0.9 % IV SOLN
500.0000 mg | INTRAVENOUS | Status: DC
  Filled 2013-04-12: qty 4

## 2013-04-12 MED ORDER — DIPHENHYDRAMINE HCL 50 MG/ML IJ SOLN: 25.0000 mg | INTRAMUSCULAR | Status: DC

## 2013-04-12 MED ORDER — DEXTROSE 5 % IV SOLN
INTRAVENOUS | Status: DC
  Administered 2013-04-12: 13:00:00 via INTRAVENOUS

## 2013-04-12 MED ORDER — ACETAMINOPHEN 325 MG PO TABS
ORAL_TABLET | ORAL | Status: AC
  Filled 2013-04-12: qty 2

## 2013-04-12 NOTE — Telephone Encounter (Signed)
Harriett Sine called and is asking about order for solumedrol 500mg  premed for pt who is there for IVIG infusion.  I consulted Dr. Anne Hahn and he stated that this is a prn order to use if pt has an allergic reaction.  I relayed to Harriett Sine, RN at [facility].  She verbalized understanding.

## 2013-05-10 ENCOUNTER — Encounter: Payer: Self-pay | Admitting: Neurology

## 2013-05-10 ENCOUNTER — Ambulatory Visit (INDEPENDENT_AMBULATORY_CARE_PROVIDER_SITE_OTHER): Payer: Medicare Other | Admitting: Neurology

## 2013-05-10 VITALS — BP 102/59 | HR 70 | Ht 68.0 in | Wt 170.0 lb

## 2013-05-10 DIAGNOSIS — D518 Other vitamin B12 deficiency anemias: Secondary | ICD-10-CM

## 2013-05-10 DIAGNOSIS — M21372 Foot drop, left foot: Secondary | ICD-10-CM | POA: Insufficient documentation

## 2013-05-10 DIAGNOSIS — M21371 Foot drop, right foot: Secondary | ICD-10-CM

## 2013-05-10 DIAGNOSIS — M216X9 Other acquired deformities of unspecified foot: Secondary | ICD-10-CM

## 2013-05-10 DIAGNOSIS — E1142 Type 2 diabetes mellitus with diabetic polyneuropathy: Secondary | ICD-10-CM

## 2013-05-10 DIAGNOSIS — R269 Unspecified abnormalities of gait and mobility: Secondary | ICD-10-CM | POA: Insufficient documentation

## 2013-05-10 HISTORY — DX: Foot drop, left foot: M21.371

## 2013-05-10 NOTE — Progress Notes (Signed)
Reason for visit: Peripheral neuropathy   Daniel Anthony is an 62 y.o. 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 The patient has developed bilateral, left greater than right, triceps weakness and bilateral proximal muscle weakness. The patient in the past has responded to IVIG with similar weakness. The patient has distal numbness of the hands and feet. The patient is now on hemodialysis at home. The patient indicates that last week on dialysis, he had sudden onset of numbness of the hands, and this has persisted to some degree. The right hand is improving, the left hand remains somewhat numb. The patient believes that the proximal weakness in the legs has gradually worsened slightly since last seen. The patient has had no benefit with the triceps weakness. The patient indicates that previously, IVIG was used for greater than one and one half years, and it did result in some benefit. The patient has fallen on occasion. The patient uses a cane for ambulation. The patient indicates that the pain level has improved somewhat, but the numbness of the feet and hands has worsened.   Past Medical History  Diagnosis Date  . Bradycardia     Hx of  . Pleural effusion, right   . Kidney disease   . Hypomagnesemia   . Hypertension   . Diabetes mellitus   . Hyperlipidemia   . Coronary artery disease   . 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 MI (myocardial infarction) 1995    anterior  . CIDP (chronic inflammatory demyelinating polyneuropathy)   . Polyneuropathy in diabetes(357.2) 12/13/2012  . Abnormality of gait   . Foot drop, bilateral 05/10/2013    Past Surgical History  Procedure Laterality Date  . Tonsillectomy    . Cardiac catheterization  1995    2 stents   . Coronary angioplasty with stent placement  1995  . Lithotripsy       Family History  Problem Relation Age of Onset  . Heart attack Father   . Hypertension Father   . Diabetes Father   . Cancer Father     prostate  . Hypertension Mother   . Cancer Maternal Grandmother     colon    Social history:  reports that he quit smoking about 33 years ago. His smoking use included Cigarettes. He smoked 0.00 packs per day. He has never used smokeless tobacco. He reports that he does not drink alcohol or use illicit drugs.   No Known Allergies  Medications:  Current Outpatient Prescriptions on File Prior to Visit  Medication Sig Dispense Refill  . amLODipine (NORVASC) 10 MG tablet Take 1 tablet (10 mg total) by mouth daily.  30 tablet  1  . aspirin 325 MG EC tablet Take 325 mg by mouth daily.        . calcitRIOL (ROCALTROL) 0.25 MCG capsule Take 0.25 mcg by mouth daily.       . carvedilol (COREG) 25 MG tablet Take 37.5 mg by mouth 2 (two) times daily with a meal.      . docusate sodium (COLACE) 250 MG capsule Take 1 capsule (250 mg total) by mouth 2 (two) times daily.  60 capsule  1  . epoetin alfa (EPOGEN,PROCRIT) 02725 UNIT/ML injection Inject 20,000 Units into the skin once. Last dose in the past month.      . escitalopram (LEXAPRO)  10 MG tablet Take 20 mg by mouth daily.       . fentaNYL (DURAGESIC - DOSED MCG/HR) 100 MCG/HR Place 1 patch onto the skin every 3 (three) days.      . insulin detemir (LEVEMIR) 100 UNIT/ML injection Inject 45 Units into the skin at bedtime.       . multivitamin (RENA-VIT) TABS tablet Take 1 tablet by mouth daily.      . polyethylene glycol (MIRALAX / GLYCOLAX) packet Take 17 g by mouth daily.  14 each    . rosuvastatin (CRESTOR) 40 MG tablet Take 40 mg by mouth 3 (three) times a week.      . sevelamer carbonate (RENVELA) 800 MG tablet Take 2,400 mg by mouth 3 (three) times daily with meals. anc before snacks      . tamsulosin (FLOMAX) 0.4 MG CAPS Take by mouth.      . insulin aspart (NOVOLOG) 100 UNIT/ML injection Inject 15  Units into the skin 3 (three) times daily before meals.       . ondansetron (ZOFRAN-ODT) 8 MG disintegrating tablet        No current facility-administered medications on file prior to visit.    ROS:  Out of a complete 14 system review of symptoms, the patient complains only of the following symptoms, and all other reviewed systems are negative.  Numbness Weakness Gait disturbance  Blood pressure 102/59, pulse 70, height 5\' 8"  (1.727 m), weight 170 lb (77.111 kg).  Physical Exam  General: The patient is alert and cooperative at the time of the examination.  Skin: No significant peripheral edema is noted.   Neurologic Exam  Cranial nerves: Facial symmetry is present. Speech is normal, no aphasia or dysarthria is noted. Extraocular movements are full. Visual fields are full.  Motor: The patient has severe weakness of the left triceps muscle, 4/5 strength of the right triceps muscle, intrinsic muscle weakness bilaterally, atrophy of the intrinsic muscles of the hands is seen. The patient has bilateral foot drops, 4/5 strength proximally in the legs bilaterally. The patient is unable to arise from a seated position with arms crossed.   Coordination: The patient has good finger-nose-finger and heel-to-shin bilaterally.  Gait and station: The patient has a slightly wide-based, unsteady gait. The patient uses a cane for ambulation. Romberg is negative. No drift is seen.  Reflexes: Deep tendon reflexes are notable for an absence of triceps reflexes, and a reduction in the biceps reflexes, reflexes are more prominent on the right than the left. With the knees, the right knee jerk reflex is present, relatively decreased on the left. Ankle jerk reflexes are absent bilaterally.   Assessment/Plan:  1. Diabetes  2. Diabetic peripheral neuropathy  3. Bilateral proximal weakness, lower extremities, bilateral triceps weakness, left greater than right  4. Gait disturbance  5. Bilateral  foot drops  The patient will be sent to physical therapy for evaluation for AFO braces. The patient will be sent for further blood work. Previously, MRI of the cervical spine was done, and shows some neuroforaminal stenosis at the C5-6 level, without spinal cord compression. The patient will be sent for blood work looking for etiologies of mononeuritis multiplex. The patient does not have an examination consistent with CIDP. The patient will followup in 4 months, we will continue the IVIG therapies for now.  We will consider MRI of the lumbosacral spine at some point.  Marlan Palau MD 05/10/2013 9:24 PM  [facility] [address]  [place], [place] [address]  Phone [phone] Fax [phone]

## 2013-05-17 ENCOUNTER — Other Ambulatory Visit: Payer: Self-pay | Admitting: Neurology

## 2013-05-22 ENCOUNTER — Encounter (HOSPITAL_COMMUNITY)
Admission: RE | Admit: 2013-05-22 | Discharge: 2013-05-22 | Disposition: A | Discharge status: Home / Self Care | Payer: Medicare Other | Source: Ambulatory Visit | Attending: Nephrology | Admitting: Nephrology

## 2013-05-22 VITALS — BP 123/55 | HR 52 | Temp 98.5°F | Resp 20 | Ht 68.0 in | Wt 170.0 lb

## 2013-05-22 DIAGNOSIS — G6181 Chronic inflammatory demyelinating polyneuritis: Secondary | ICD-10-CM

## 2013-05-22 MED ORDER — DEXTROSE 5 % IV SOLN
INTRAVENOUS | Status: DC
  Administered 2013-05-22: 10:00:00 via INTRAVENOUS

## 2013-05-22 MED ORDER — IMMUNE GLOBULIN (HUMAN) 10 GM/100ML IV SOLN
400.0000 mg/kg | INTRAVENOUS | Status: DC
  Administered 2013-05-22: 30 g via INTRAVENOUS
  Filled 2013-05-22: qty 300

## 2013-05-22 MED ORDER — ACETAMINOPHEN 325 MG PO TABS: 650.0000 mg | ORAL_TABLET | ORAL | Status: DC

## 2013-05-22 MED ORDER — DIPHENHYDRAMINE HCL 50 MG/ML IJ SOLN: 25.0000 mg | INTRAMUSCULAR | Status: DC

## 2013-05-22 MED ORDER — SODIUM CHLORIDE 0.9 % IV SOLN
500.0000 mg | INTRAVENOUS | Status: DC
  Filled 2013-05-22: qty 4

## 2013-05-29 LAB — IFE AND PE, SERUM
Albumin SerPl Elph-Mcnc: 3.4 g/dL (ref 3.2–5.6)
Alpha 1: 0.3 g/dL (ref 0.1–0.4)
IgA/Immunoglobulin A, Serum: 296 mg/dL (ref 91–414)
IgM (Immunoglobulin M), Srm: 126 mg/dL (ref 40–230)
Total Protein: 6.8 g/dL (ref 6.0–8.5)

## 2013-05-29 LAB — SEDIMENTATION RATE: Sed Rate: 32 mm/hr — ABNORMAL HIGH (ref 0–30)

## 2013-05-29 LAB — GM1 ANTIBODY IGG, IGM: GM 1 IgG: <1:100 {titer}

## 2013-05-29 LAB — ANGIOTENSIN CONVERTING ENZYME: Angio Convert Enzyme: 50 U/L (ref 14–82)

## 2013-05-31 NOTE — Progress Notes (Signed)
Quick Note:  I called and relayed the results of lab work to pt. He verbalized understanding. ______

## 2013-06-05 ENCOUNTER — Ambulatory Visit: Payer: Medicare Other | Attending: Neurology | Admitting: Physical Therapy

## 2013-06-05 DIAGNOSIS — R269 Unspecified abnormalities of gait and mobility: Secondary | ICD-10-CM | POA: Insufficient documentation

## 2013-06-05 DIAGNOSIS — M6281 Muscle weakness (generalized): Secondary | ICD-10-CM | POA: Insufficient documentation

## 2013-06-05 DIAGNOSIS — IMO0001 Reserved for inherently not codable concepts without codable children: Secondary | ICD-10-CM | POA: Insufficient documentation

## 2013-06-12 ENCOUNTER — Ambulatory Visit: Payer: Medicare Other | Admitting: Physical Therapy

## 2013-06-19 ENCOUNTER — Encounter (HOSPITAL_COMMUNITY): Payer: PRIVATE HEALTH INSURANCE

## 2013-06-19 ENCOUNTER — Encounter (HOSPITAL_COMMUNITY)
Admission: RE | Admit: 2013-06-19 | Discharge: 2013-06-19 | Disposition: A | Discharge status: Home / Self Care | Payer: Medicare Other | Source: Ambulatory Visit | Attending: Nephrology | Admitting: Nephrology

## 2013-06-19 ENCOUNTER — Ambulatory Visit: Payer: Medicare Other | Attending: Neurology | Admitting: Physical Therapy

## 2013-06-19 ENCOUNTER — Emergency Department: Payer: Self-pay | Admitting: Emergency Medicine

## 2013-06-19 VITALS — BP 172/70 | HR 60 | Temp 98.4°F | Resp 20

## 2013-06-19 DIAGNOSIS — G6181 Chronic inflammatory demyelinating polyneuritis: Secondary | ICD-10-CM | POA: Insufficient documentation

## 2013-06-19 DIAGNOSIS — IMO0001 Reserved for inherently not codable concepts without codable children: Secondary | ICD-10-CM | POA: Insufficient documentation

## 2013-06-19 DIAGNOSIS — M6281 Muscle weakness (generalized): Secondary | ICD-10-CM | POA: Insufficient documentation

## 2013-06-19 DIAGNOSIS — R269 Unspecified abnormalities of gait and mobility: Secondary | ICD-10-CM | POA: Insufficient documentation

## 2013-06-19 MED ORDER — DIPHENHYDRAMINE HCL 50 MG/ML IJ SOLN: 25.0000 mg | INTRAMUSCULAR | Status: DC

## 2013-06-19 MED ORDER — DEXTROSE 5 % IV SOLN
INTRAVENOUS | Status: DC
  Administered 2013-06-19: 12:00:00 via INTRAVENOUS

## 2013-06-19 MED ORDER — SODIUM CHLORIDE 0.9 % IV SOLN
500.0000 mg | INTRAVENOUS | Status: DC
  Filled 2013-06-19: qty 4

## 2013-06-19 MED ORDER — ACETAMINOPHEN 325 MG PO TABS: 650.0000 mg | ORAL_TABLET | ORAL | Status: DC

## 2013-06-19 MED ORDER — IMMUNE GLOBULIN (HUMAN) 10 GM/100ML IV SOLN
400.0000 mg/kg | INTRAVENOUS | Status: DC
  Administered 2013-06-19: 30 g via INTRAVENOUS
  Filled 2013-06-19: qty 300

## 2013-06-21 ENCOUNTER — Other Ambulatory Visit: Payer: Self-pay

## 2013-06-21 ENCOUNTER — Ambulatory Visit: Payer: PRIVATE HEALTH INSURANCE | Admitting: Physical Therapy

## 2013-06-26 ENCOUNTER — Ambulatory Visit: Payer: Medicare Other | Admitting: Physical Therapy

## 2013-06-28 ENCOUNTER — Ambulatory Visit: Payer: Medicare Other | Admitting: Physical Therapy

## 2013-07-03 ENCOUNTER — Ambulatory Visit: Payer: Medicare Other | Admitting: Physical Therapy

## 2013-07-05 ENCOUNTER — Ambulatory Visit: Payer: PRIVATE HEALTH INSURANCE | Admitting: Physical Therapy

## 2013-07-05 ENCOUNTER — Ambulatory Visit: Payer: Medicare Other | Admitting: Physical Therapy

## 2013-07-10 ENCOUNTER — Ambulatory Visit: Payer: Medicare Other | Admitting: Physical Therapy

## 2013-07-16 ENCOUNTER — Ambulatory Visit: Payer: Self-pay | Admitting: Vascular Surgery

## 2013-07-16 ENCOUNTER — Other Ambulatory Visit: Payer: Self-pay | Admitting: *Deleted

## 2013-07-16 MED ORDER — ROSUVASTATIN CALCIUM 40 MG PO TABS: 40.0000 mg | ORAL_TABLET | ORAL | Status: DC

## 2013-07-16 NOTE — Telephone Encounter (Signed)
Requested Prescriptions   Signed Prescriptions Disp Refills  . rosuvastatin (CRESTOR) 40 MG tablet 30 tablet 1    Sig: Take 1 tablet (40 mg total) by mouth 3 (three) times a week.    Authorizing Provider: Antonieta Iba    Ordering User: Kendrick Fries

## 2013-07-17 ENCOUNTER — Encounter (HOSPITAL_COMMUNITY)
Admission: RE | Admit: 2013-07-17 | Discharge: 2013-07-17 | Disposition: A | Discharge status: Home / Self Care | Payer: Medicare Other | Source: Ambulatory Visit | Attending: Nephrology | Admitting: Nephrology

## 2013-07-17 ENCOUNTER — Ambulatory Visit: Payer: Medicare Other | Attending: Neurology | Admitting: Physical Therapy

## 2013-07-17 VITALS — BP 179/73 | HR 63 | Temp 98.2°F | Resp 20 | Ht 68.0 in | Wt 170.0 lb

## 2013-07-17 DIAGNOSIS — R269 Unspecified abnormalities of gait and mobility: Secondary | ICD-10-CM | POA: Insufficient documentation

## 2013-07-17 DIAGNOSIS — G6181 Chronic inflammatory demyelinating polyneuritis: Secondary | ICD-10-CM | POA: Insufficient documentation

## 2013-07-17 DIAGNOSIS — M6281 Muscle weakness (generalized): Secondary | ICD-10-CM | POA: Insufficient documentation

## 2013-07-17 DIAGNOSIS — IMO0001 Reserved for inherently not codable concepts without codable children: Secondary | ICD-10-CM | POA: Insufficient documentation

## 2013-07-17 MED ORDER — ACETAMINOPHEN 325 MG PO TABS: 650.0000 mg | ORAL_TABLET | ORAL | Status: DC

## 2013-07-17 MED ORDER — DEXTROSE 5 % IV SOLN
INTRAVENOUS | Status: DC
  Administered 2013-07-17: 11:00:00 via INTRAVENOUS

## 2013-07-17 MED ORDER — DIPHENHYDRAMINE HCL 50 MG/ML IJ SOLN: 25.0000 mg | INTRAMUSCULAR | Status: DC

## 2013-07-17 MED ORDER — SODIUM CHLORIDE 0.9 % IV SOLN: 500.0000 mg | INTRAVENOUS | Status: DC

## 2013-07-17 MED ORDER — IMMUNE GLOBULIN (HUMAN) 10 GM/100ML IV SOLN
400.0000 mg/kg | INTRAVENOUS | Status: DC
  Administered 2013-07-17: 30 g via INTRAVENOUS
  Filled 2013-07-17: qty 300

## 2013-07-17 MED ORDER — METHYLPREDNISOLONE SODIUM SUCC 125 MG IJ SOLR: 500.0000 mg | Freq: Once | INTRAMUSCULAR | Status: DC

## 2013-07-19 ENCOUNTER — Ambulatory Visit: Payer: Medicare Other | Admitting: Physical Therapy

## 2013-07-23 ENCOUNTER — Encounter: Payer: Self-pay | Admitting: Cardiovascular Disease

## 2013-07-23 ENCOUNTER — Ambulatory Visit (INDEPENDENT_AMBULATORY_CARE_PROVIDER_SITE_OTHER): Payer: PRIVATE HEALTH INSURANCE | Admitting: Cardiovascular Disease

## 2013-07-23 VITALS — BP 120/80 | HR 60 | Ht 68.0 in | Wt 176.2 lb

## 2013-07-23 DIAGNOSIS — I10 Essential (primary) hypertension: Secondary | ICD-10-CM

## 2013-07-23 DIAGNOSIS — E785 Hyperlipidemia, unspecified: Secondary | ICD-10-CM

## 2013-07-23 DIAGNOSIS — I2510 Atherosclerotic heart disease of native coronary artery without angina pectoris: Secondary | ICD-10-CM

## 2013-07-23 NOTE — Assessment & Plan Note (Signed)
Cholesterol is at goal on the current lipid regimen. No changes to the medications were made.  

## 2013-07-23 NOTE — Assessment & Plan Note (Signed)
Currently with no symptoms of angina. No further workup at this time. Continue current medication regimen. 

## 2013-07-23 NOTE — Progress Notes (Signed)
Patient ID: Daniel Anthony, male    DOB: [birth year], 62 y.o.   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 Stress test was done early 2012 that showed no significant ischemia.  He reports that overall he has been doing well.  Currently doing home HD three times a week. His wife runs the HD for him. So far doing well. BP running lower on HD, he has had to cut back on his amlodipine and coreg.    He is not very active and has no regular activity or exercise. Now wears races for foot drop . Severe leg weakness, neuropathy, No significant chest pain or worsening of his shortness of breath He is on IV Ig every 28 days  Echocardiogram February 2012 was essentially normal. There was no mention on whether there was diastolic dysfunction. Study was read by the outside cardiologist  He reports he takes Crestor 40 mg 3 days per week Recent lab work showing total cholesterol 135, LDL 60, HDL 43  EKG shows normal sinus rhythm with rate 60 beats per minute with no significant ST or T wave changes  Outpatient Encounter Prescriptions as of 07/23/2013  Medication Sig  . amLODipine (NORVASC) 10 MG tablet Take 5 mg by mouth daily.  Marland Kitchen aspirin 325 MG EC tablet Take 325 mg by mouth daily.    . calcitRIOL (ROCALTROL) 0.25 MCG capsule Take 0.25 mcg by mouth daily.   . carvedilol (COREG) 25 MG tablet Take 25 mg by mouth 2 (two) times daily with a meal.   . docusate sodium  (COLACE) 250 MG capsule Take 1 capsule (250 mg total) by mouth 2 (two) times daily.  Marland Kitchen escitalopram (LEXAPRO) 10 MG tablet Take 20 mg by mouth daily.   . fentaNYL (DURAGESIC - DOSED MCG/HR) 100 MCG/HR Place 1 patch onto the skin every 3 (three) days.  . insulin aspart (NOVOLOG) 100 UNIT/ML injection Inject 15 Units into the skin 3 (three) times daily before meals.   . insulin detemir (LEVEMIR) 100 UNIT/ML injection Inject 45 Units into the skin at bedtime.   . lidocaine-prilocaine (EMLA) cream   . multivitamin (RENA-VIT) TABS tablet Take 1 tablet by mouth daily.  . polyethylene glycol (MIRALAX / GLYCOLAX) packet Take 17 g by mouth daily.  . rosuvastatin (CRESTOR) 40 MG tablet Take 1 tablet (40 mg total) by mouth 3 (three) times a week.  . sevelamer carbonate (RENVELA) 800 MG tablet Take 2,400 mg by mouth 3 (three) times daily with meals. anc before snacks  . tamsulosin (FLOMAX) 0.4 MG CAPS Take by mouth.   Review of Systems  Constitutional: Negative.   HENT: Negative.   Eyes: Negative.   Cardiovascular: Negative.   Gastrointestinal: Negative.   Musculoskeletal:       Profound lower extremity muscle weakness, muscle atrophy in his hands  Skin: Negative.   Neurological: Negative.   Psychiatric/Behavioral: Negative.   All other systems reviewed and are negative.    BP 120/80  Pulse 60  Ht 5\' 8"  (1.727 m)  Wt 176 lb 4 oz (79.946 kg)  BMI 26.80 kg/m2  Physical Exam  Nursing note and vitals reviewed. Constitutional: He is oriented to person, place, and time. He appears well-developed and well-nourished.   muscle wasting  in his hands, thin legs  HENT:  Head: Normocephalic.  Nose: Nose normal.  Mouth/Throat: Oropharynx is clear and moist.  Eyes: Conjunctivae are normal. Pupils are equal, round, and reactive to light.  Neck: Normal range of motion. Neck supple. No JVD present.  Cardiovascular: Normal rate, regular rhythm, S1 normal, S2 normal, normal heart sounds and intact distal  pulses.  Exam reveals no gallop and no friction rub.   No murmur heard. Pulmonary/Chest: Effort normal and breath sounds normal. No respiratory distress. He has no wheezes. He has no rales. He exhibits no tenderness.  Abdominal: Soft. Bowel sounds are normal. He exhibits no distension. There is no tenderness.  Musculoskeletal: Normal range of motion. He exhibits no edema and no tenderness.  Lymphadenopathy:    He has no cervical adenopathy.  Neurological: He is alert and oriented to person, place, and time. Coordination normal.  Skin: Skin is warm and dry. No rash noted. No erythema.  Psychiatric: He has a normal mood and affect. His behavior is normal. Judgment and thought content normal.      Assessment and Plan

## 2013-07-23 NOTE — Assessment & Plan Note (Signed)
Blood pressure is well controlled on today's visit. No changes made to the medications. 

## 2013-07-23 NOTE — Patient Instructions (Signed)
You are doing well. No medication changes were made.  Please call us if you have new issues that need to be addressed before your next appt.  Your physician wants you to follow-up in: 6 months.  You will receive a reminder letter in the mail two months in advance. If you don't receive a letter, please call our office to schedule the follow-up appointment.   

## 2013-07-24 ENCOUNTER — Ambulatory Visit: Payer: Medicare Other | Admitting: Physical Therapy

## 2013-07-26 ENCOUNTER — Ambulatory Visit: Payer: Medicare Other | Admitting: Physical Therapy

## 2013-07-31 ENCOUNTER — Ambulatory Visit: Payer: Medicare Other | Admitting: Physical Therapy

## 2013-08-02 ENCOUNTER — Ambulatory Visit: Payer: Medicare Other | Admitting: Physical Therapy

## 2013-08-07 ENCOUNTER — Ambulatory Visit: Payer: PRIVATE HEALTH INSURANCE | Admitting: Physical Therapy

## 2013-08-14 ENCOUNTER — Ambulatory Visit: Payer: PRIVATE HEALTH INSURANCE | Admitting: Physical Therapy

## 2013-08-15 ENCOUNTER — Observation Stay (HOSPITAL_COMMUNITY)
Admission: EM | Admit: 2013-08-15 | Discharge: 2013-08-16 | Disposition: A | Discharge status: Home / Self Care | Payer: Medicare Other | Attending: Internal Medicine | Admitting: Internal Medicine

## 2013-08-15 ENCOUNTER — Encounter (HOSPITAL_COMMUNITY): Payer: Self-pay | Admitting: Emergency Medicine

## 2013-08-15 DIAGNOSIS — R0989 Other specified symptoms and signs involving the circulatory and respiratory systems: Secondary | ICD-10-CM

## 2013-08-15 DIAGNOSIS — G6181 Chronic inflammatory demyelinating polyneuritis: Secondary | ICD-10-CM | POA: Diagnosis present

## 2013-08-15 DIAGNOSIS — E785 Hyperlipidemia, unspecified: Secondary | ICD-10-CM | POA: Diagnosis present

## 2013-08-15 DIAGNOSIS — Z794 Long term (current) use of insulin: Secondary | ICD-10-CM | POA: Insufficient documentation

## 2013-08-15 DIAGNOSIS — T888XXA Other specified complications of surgical and medical care, not elsewhere classified, initial encounter: Principal | ICD-10-CM | POA: Insufficient documentation

## 2013-08-15 DIAGNOSIS — R5381 Other malaise: Secondary | ICD-10-CM | POA: Insufficient documentation

## 2013-08-15 DIAGNOSIS — E1129 Type 2 diabetes mellitus with other diabetic kidney complication: Secondary | ICD-10-CM

## 2013-08-15 DIAGNOSIS — Z992 Dependence on renal dialysis: Secondary | ICD-10-CM | POA: Insufficient documentation

## 2013-08-15 DIAGNOSIS — R0602 Shortness of breath: Secondary | ICD-10-CM

## 2013-08-15 DIAGNOSIS — E1149 Type 2 diabetes mellitus with other diabetic neurological complication: Secondary | ICD-10-CM | POA: Insufficient documentation

## 2013-08-15 DIAGNOSIS — R6889 Other general symptoms and signs: Secondary | ICD-10-CM

## 2013-08-15 DIAGNOSIS — I120 Hypertensive chronic kidney disease with stage 5 chronic kidney disease or end stage renal disease: Secondary | ICD-10-CM | POA: Insufficient documentation

## 2013-08-15 DIAGNOSIS — Y841 Kidney dialysis as the cause of abnormal reaction of the patient, or of later complication, without mention of misadventure at the time of the procedure: Secondary | ICD-10-CM | POA: Insufficient documentation

## 2013-08-15 DIAGNOSIS — M21371 Foot drop, right foot: Secondary | ICD-10-CM

## 2013-08-15 DIAGNOSIS — R4182 Altered mental status, unspecified: Secondary | ICD-10-CM | POA: Diagnosis present

## 2013-08-15 DIAGNOSIS — R7989 Other specified abnormal findings of blood chemistry: Secondary | ICD-10-CM | POA: Diagnosis present

## 2013-08-15 DIAGNOSIS — A084 Viral intestinal infection, unspecified: Secondary | ICD-10-CM | POA: Diagnosis present

## 2013-08-15 DIAGNOSIS — I252 Old myocardial infarction: Secondary | ICD-10-CM | POA: Insufficient documentation

## 2013-08-15 DIAGNOSIS — Z7982 Long term (current) use of aspirin: Secondary | ICD-10-CM | POA: Insufficient documentation

## 2013-08-15 DIAGNOSIS — E1142 Type 2 diabetes mellitus with diabetic polyneuropathy: Secondary | ICD-10-CM

## 2013-08-15 DIAGNOSIS — D72829 Elevated white blood cell count, unspecified: Secondary | ICD-10-CM | POA: Diagnosis present

## 2013-08-15 DIAGNOSIS — I2510 Atherosclerotic heart disease of native coronary artery without angina pectoris: Secondary | ICD-10-CM | POA: Diagnosis present

## 2013-08-15 DIAGNOSIS — R269 Unspecified abnormalities of gait and mobility: Secondary | ICD-10-CM

## 2013-08-15 DIAGNOSIS — IMO0002 Reserved for concepts with insufficient information to code with codable children: Secondary | ICD-10-CM | POA: Diagnosis present

## 2013-08-15 DIAGNOSIS — I10 Essential (primary) hypertension: Secondary | ICD-10-CM | POA: Diagnosis present

## 2013-08-15 DIAGNOSIS — N186 End stage renal disease: Secondary | ICD-10-CM | POA: Diagnosis present

## 2013-08-15 DIAGNOSIS — R509 Fever, unspecified: Secondary | ICD-10-CM | POA: Diagnosis present

## 2013-08-15 DIAGNOSIS — R1110 Vomiting, unspecified: Secondary | ICD-10-CM

## 2013-08-15 MED ORDER — ONDANSETRON HCL 4 MG/2ML IJ SOLN
4.0000 mg | Freq: Once | INTRAMUSCULAR | Status: AC
  Administered 2013-08-16: 4 mg via INTRAVENOUS
  Filled 2013-08-15: qty 2

## 2013-08-15 MED ORDER — ACETAMINOPHEN 325 MG PO TABS
650.0000 mg | ORAL_TABLET | Freq: Once | ORAL | Status: AC
  Administered 2013-08-16: 650 mg via ORAL
  Filled 2013-08-15: qty 2

## 2013-08-15 MED ORDER — OSELTAMIVIR PHOSPHATE 75 MG PO CAPS
75.0000 mg | ORAL_CAPSULE | Freq: Once | ORAL | Status: AC
  Administered 2013-08-16: 75 mg via ORAL
  Filled 2013-08-15: qty 1

## 2013-08-15 NOTE — ED Notes (Signed)
Pt's wife reports that pt become "very sleepy and lethargic" after hemodialysis at home tonight.  Reports 1 episode of vomiting and temp 101.5. Pt alert and oriented at this time.  Denies pain.

## 2013-08-16 ENCOUNTER — Encounter (HOSPITAL_COMMUNITY): Payer: Self-pay | Admitting: Internal Medicine

## 2013-08-16 ENCOUNTER — Emergency Department (HOSPITAL_COMMUNITY): Payer: Medicare Other

## 2013-08-16 DIAGNOSIS — D72829 Elevated white blood cell count, unspecified: Secondary | ICD-10-CM | POA: Diagnosis present

## 2013-08-16 DIAGNOSIS — R7989 Other specified abnormal findings of blood chemistry: Secondary | ICD-10-CM

## 2013-08-16 DIAGNOSIS — R509 Fever, unspecified: Secondary | ICD-10-CM

## 2013-08-16 DIAGNOSIS — N186 End stage renal disease: Secondary | ICD-10-CM | POA: Diagnosis present

## 2013-08-16 DIAGNOSIS — R945 Abnormal results of liver function studies: Secondary | ICD-10-CM

## 2013-08-16 DIAGNOSIS — R4182 Altered mental status, unspecified: Secondary | ICD-10-CM

## 2013-08-16 DIAGNOSIS — A084 Viral intestinal infection, unspecified: Secondary | ICD-10-CM | POA: Diagnosis present

## 2013-08-16 DIAGNOSIS — G6181 Chronic inflammatory demyelinating polyneuritis: Secondary | ICD-10-CM

## 2013-08-16 LAB — INFLUENZA PANEL BY PCR (TYPE A & B)
H1N1 flu by pcr: NOT DETECTED
INFLBPCR: NEGATIVE
Influenza A By PCR: NEGATIVE

## 2013-08-16 LAB — CBC
HCT: 41.6 % (ref 39.0–52.0)
HEMATOCRIT: 37.8 % — AB (ref 39.0–52.0)
Hemoglobin: 14.7 g/dL (ref 13.0–17.0)
Hemoglobin: 13.1 g/dL (ref 13.0–17.0)
MCH: 30.9 pg (ref 26.0–34.0)
MCH: 30.5 pg (ref 26.0–34.0)
MCHC: 35.3 g/dL (ref 30.0–36.0)
MCHC: 34.7 g/dL (ref 30.0–36.0)
MCV: 87.6 fL (ref 78.0–100.0)
MCV: 88.1 fL (ref 78.0–100.0)
Platelets: 180 10*3/uL (ref 150–400)
Platelets: 171 10*3/uL (ref 150–400)
RBC: 4.75 MIL/uL (ref 4.22–5.81)
RBC: 4.29 MIL/uL (ref 4.22–5.81)
RDW: 13.3 % (ref 11.5–15.5)
RDW: 13.5 % (ref 11.5–15.5)
WBC: 15.5 10*3/uL — ABNORMAL HIGH (ref 4.0–10.5)
WBC: 11.6 10*3/uL — AB (ref 4.0–10.5)

## 2013-08-16 LAB — COMPREHENSIVE METABOLIC PANEL
ALT: 152 U/L — ABNORMAL HIGH (ref 0–53)
ALT: 120 U/L — ABNORMAL HIGH (ref 0–53)
AST: 121 U/L — ABNORMAL HIGH (ref 0–37)
AST: 71 U/L — ABNORMAL HIGH (ref 0–37)
Albumin: 3.4 g/dL — ABNORMAL LOW (ref 3.5–5.2)
Albumin: 3 g/dL — ABNORMAL LOW (ref 3.5–5.2)
Alkaline Phosphatase: 216 U/L — ABNORMAL HIGH (ref 39–117)
Alkaline Phosphatase: 187 U/L — ABNORMAL HIGH (ref 39–117)
BUN: 19 mg/dL (ref 6–23)
BUN: 28 mg/dL — AB (ref 6–23)
CALCIUM: 8.7 mg/dL (ref 8.4–10.5)
CO2: 28 mEq/L (ref 19–32)
CO2: 23 mEq/L (ref 19–32)
CREATININE: 3.48 mg/dL — AB (ref 0.50–1.35)
Calcium: 9 mg/dL (ref 8.4–10.5)
Chloride: 87 mEq/L — ABNORMAL LOW (ref 96–112)
Chloride: 88 mEq/L — ABNORMAL LOW (ref 96–112)
Creatinine, Ser: 2.64 mg/dL — ABNORMAL HIGH (ref 0.50–1.35)
GFR calc Af Amer: 28 mL/min — ABNORMAL LOW (ref >90)
GFR calc non Af Amer: 24 mL/min — ABNORMAL LOW (ref >90)
GFR calc non Af Amer: 17 mL/min — ABNORMAL LOW (ref >90)
GFR, EST AFRICAN AMERICAN: 20 mL/min — AB (ref >90)
GLUCOSE: 249 mg/dL — AB (ref 70–99)
Glucose, Bld: 212 mg/dL — ABNORMAL HIGH (ref 70–99)
Potassium: 3.9 mEq/L (ref 3.7–5.3)
Potassium: 4.4 mEq/L (ref 3.7–5.3)
Sodium: 132 mEq/L — ABNORMAL LOW (ref 137–147)
Sodium: 133 mEq/L — ABNORMAL LOW (ref 137–147)
TOTAL PROTEIN: 7.2 g/dL (ref 6.0–8.3)
Total Bilirubin: 0.6 mg/dL (ref 0.3–1.2)
Total Bilirubin: 0.5 mg/dL (ref 0.3–1.2)
Total Protein: 8.1 g/dL (ref 6.0–8.3)

## 2013-08-16 LAB — POCT I-STAT, CHEM 8
BUN: 24 mg/dL — AB (ref 6–23)
CHLORIDE: 91 meq/L — AB (ref 96–112)
CREATININE: 2.8 mg/dL — AB (ref 0.50–1.35)
Calcium, Ion: 1.05 mmol/L — ABNORMAL LOW (ref 1.13–1.30)
Glucose, Bld: 221 mg/dL — ABNORMAL HIGH (ref 70–99)
HCT: 47 % (ref 39.0–52.0)
HEMOGLOBIN: 16 g/dL (ref 13.0–17.0)
POTASSIUM: 3.7 meq/L (ref 3.7–5.3)
SODIUM: 131 meq/L — AB (ref 137–147)
TCO2: 30 mmol/L (ref 0–100)

## 2013-08-16 LAB — GLUCOSE, CAPILLARY
GLUCOSE-CAPILLARY: 161 mg/dL — AB (ref 70–99)
Glucose-Capillary: 229 mg/dL — ABNORMAL HIGH (ref 70–99)

## 2013-08-16 LAB — CG4 I-STAT (LACTIC ACID): LACTIC ACID, VENOUS: 1.25 mmol/L (ref 0.5–2.2)

## 2013-08-16 MED ORDER — CALCITRIOL 0.25 MCG PO CAPS
0.2500 ug | ORAL_CAPSULE | Freq: Every day | ORAL | Status: DC
  Administered 2013-08-16: 0.25 ug via ORAL
  Filled 2013-08-16: qty 1

## 2013-08-16 MED ORDER — ACETAMINOPHEN 325 MG PO TABS: 650.0000 mg | ORAL_TABLET | Freq: Four times a day (QID) | ORAL | Status: DC | PRN

## 2013-08-16 MED ORDER — ESCITALOPRAM OXALATE 20 MG PO TABS
20.0000 mg | ORAL_TABLET | Freq: Every day | ORAL | Status: DC
  Administered 2013-08-16: 11:00:00 20 mg via ORAL
  Filled 2013-08-16: qty 1

## 2013-08-16 MED ORDER — ONDANSETRON HCL 4 MG/2ML IJ SOLN: 4.0000 mg | Freq: Four times a day (QID) | INTRAMUSCULAR | Status: DC | PRN

## 2013-08-16 MED ORDER — ONDANSETRON HCL 4 MG PO TABS
4.0000 mg | ORAL_TABLET | Freq: Four times a day (QID) | ORAL | Status: DC | PRN
  Administered 2013-08-16: 09:00:00 4 mg via ORAL
  Filled 2013-08-16: qty 1

## 2013-08-16 MED ORDER — POLYETHYLENE GLYCOL 3350 17 G PO PACK
17.0000 g | PACK | ORAL | Status: DC
  Filled 2013-08-16: qty 1

## 2013-08-16 MED ORDER — TAMSULOSIN HCL 0.4 MG PO CAPS
0.4000 mg | ORAL_CAPSULE | Freq: Every day | ORAL | Status: DC
  Administered 2013-08-16: 11:00:00 0.4 mg via ORAL
  Filled 2013-08-16: qty 1

## 2013-08-16 MED ORDER — RENA-VITE PO TABS
1.0000 | ORAL_TABLET | Freq: Every day | ORAL | Status: DC
  Administered 2013-08-16: 1 via ORAL
  Filled 2013-08-16: qty 1

## 2013-08-16 MED ORDER — HEPARIN SODIUM (PORCINE) 5000 UNIT/ML IJ SOLN
5000.0000 [IU] | Freq: Three times a day (TID) | INTRAMUSCULAR | Status: DC
  Administered 2013-08-16: 06:00:00 5000 [IU] via SUBCUTANEOUS
  Filled 2013-08-16 (×4): qty 1

## 2013-08-16 MED ORDER — INSULIN ASPART 100 UNIT/ML ~~LOC~~ SOLN
0.0000 [IU] | Freq: Three times a day (TID) | SUBCUTANEOUS | Status: DC
  Administered 2013-08-16: 12:00:00 2 [IU] via SUBCUTANEOUS
  Administered 2013-08-16: 3 [IU] via SUBCUTANEOUS

## 2013-08-16 MED ORDER — PIPERACILLIN-TAZOBACTAM 3.375 G IVPB
3.3750 g | Freq: Once | INTRAVENOUS | Status: AC
  Administered 2013-08-16: 3.375 g via INTRAVENOUS
  Filled 2013-08-16: qty 50

## 2013-08-16 MED ORDER — INSULIN ASPART 100 UNIT/ML ~~LOC~~ SOLN: 0.0000 [IU] | Freq: Every day | SUBCUTANEOUS | Status: DC

## 2013-08-16 MED ORDER — INSULIN DETEMIR 100 UNIT/ML ~~LOC~~ SOLN
45.0000 [IU] | Freq: Every day | SUBCUTANEOUS | Status: DC
  Filled 2013-08-16: qty 0.45

## 2013-08-16 MED ORDER — CARVEDILOL 25 MG PO TABS
25.0000 mg | ORAL_TABLET | Freq: Two times a day (BID) | ORAL | Status: DC
  Administered 2013-08-16: 25 mg via ORAL
  Filled 2013-08-16 (×3): qty 1

## 2013-08-16 MED ORDER — DOCUSATE SODIUM 50 MG PO CAPS
250.0000 mg | ORAL_CAPSULE | Freq: Two times a day (BID) | ORAL | Status: DC
  Administered 2013-08-16: 250 mg via ORAL
  Filled 2013-08-16 (×2): qty 1

## 2013-08-16 MED ORDER — FENTANYL 50 MCG/HR TD PT72
100.0000 ug | MEDICATED_PATCH | TRANSDERMAL | Status: DC
  Administered 2013-08-16: 06:00:00 100 ug via TRANSDERMAL
  Filled 2013-08-16: qty 2

## 2013-08-16 MED ORDER — DIPHENHYDRAMINE HCL 50 MG/ML IJ SOLN
INTRAMUSCULAR | Status: AC
  Filled 2013-08-16: qty 1

## 2013-08-16 MED ORDER — OSELTAMIVIR PHOSPHATE 30 MG PO CAPS
30.0000 mg | ORAL_CAPSULE | Freq: Once | ORAL | Status: DC
  Filled 2013-08-16: qty 1

## 2013-08-16 MED ORDER — VANCOMYCIN HCL 10 G IV SOLR
1500.0000 mg | Freq: Once | INTRAVENOUS | Status: AC
  Administered 2013-08-16: 16:00:00 1500 mg via INTRAVENOUS
  Filled 2013-08-16 (×3): qty 1500

## 2013-08-16 MED ORDER — DIPHENHYDRAMINE HCL 50 MG/ML IJ SOLN
25.0000 mg | Freq: Once | INTRAMUSCULAR | Status: AC
  Administered 2013-08-16: 25 mg via INTRAVENOUS

## 2013-08-16 MED ORDER — AMLODIPINE BESYLATE 5 MG PO TABS
5.0000 mg | ORAL_TABLET | Freq: Every day | ORAL | Status: DC
  Administered 2013-08-16: 5 mg via ORAL
  Filled 2013-08-16: qty 1

## 2013-08-16 MED ORDER — ASPIRIN EC 81 MG PO TBEC
162.0000 mg | DELAYED_RELEASE_TABLET | Freq: Every day | ORAL | Status: DC
  Administered 2013-08-16: 11:00:00 162 mg via ORAL
  Filled 2013-08-16: qty 2

## 2013-08-16 MED ORDER — SODIUM CHLORIDE 0.9 % IV SOLN
INTRAVENOUS | Status: DC
  Administered 2013-08-16: 03:00:00 via INTRAVENOUS

## 2013-08-16 MED ORDER — ATORVASTATIN CALCIUM 80 MG PO TABS
80.0000 mg | ORAL_TABLET | Freq: Every day | ORAL | Status: DC
Start: 2013-08-16 — End: 2013-08-16
  Filled 2013-08-16: qty 1

## 2013-08-16 MED ORDER — INSULIN ASPART 100 UNIT/ML ~~LOC~~ SOLN
15.0000 [IU] | Freq: Three times a day (TID) | SUBCUTANEOUS | Status: DC
Start: 2013-08-16 — End: 2013-08-16
  Administered 2013-08-16 (×2): 15 [IU] via SUBCUTANEOUS

## 2013-08-16 MED ORDER — VANCOMYCIN HCL IN DEXTROSE 1-5 GM/200ML-% IV SOLN
1000.0000 mg | Freq: Once | INTRAVENOUS | Status: DC
  Filled 2013-08-16: qty 200

## 2013-08-16 MED ORDER — ACETAMINOPHEN 650 MG RE SUPP: 650.0000 mg | Freq: Four times a day (QID) | RECTAL | Status: DC | PRN

## 2013-08-16 MED ORDER — SEVELAMER CARBONATE 800 MG PO TABS
2400.0000 mg | ORAL_TABLET | Freq: Three times a day (TID) | ORAL | Status: DC
  Administered 2013-08-16 (×2): 2400 mg via ORAL
  Filled 2013-08-16 (×4): qty 3

## 2013-08-16 NOTE — Discharge Summary (Signed)
Physician Discharge Summary  Daniel Anthony ZOX:[id number] DOB: 16-Oct-1950 DOA: 08/15/2013  PCP: Bosie Clos, MD  Admit date: 08/15/2013 Discharge date: 08/16/2013  Time spent: 45 minutes  Recommendations for Outpatient Follow-up:  1. Follow up with Dr. Sullivan Lone in 3 days to check finalized blood culture results and follow LFTs to ensure they have normalized. 2. If LFTs have not normalized at follow up would recommend abd u/s to rule out cholecystitis. 3. Continue normal HD schedule.  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 Patient reported that he had about 3.1 L of fluid removed yesterday at home during dialysis after he weighed himself. This amount of fluid removal is higher than normal. At the end of the dialysis session, he was "lethargic"as per his significant other. He also checked his temperature initially was 100.2 but increased to 101.5. He called [facility], who instructed the patient to come to the emergency department for further evaluation. In the emergency department, he was initially hypotensive with blood pressure of 90s over 50s but improved  without any intervention to 120s over 60s. He also had a low-grade temperature of 100. Hospitalist service was called to admit the patient. In the emergency department, he was started on Zosyn, but developed generalized itching for which she was given Benadryl and the antibiotic was discontinued. Prior to yesterday, has not had any fevers or chills. Denies any chest pain or shortness of breath. Denies any abdominal pain or diarrhea.   Hospital Course:   Viral illness Patient reported 1 episode of vomiting. Fever resolved.  Vomiting resolved.  Patient feeling well and would prefer to be discharged Blood cultures were drawn in the ED. Patient's CXR is clear Unable to obtain a U/A (anuric) I spoke with Dr. Arlean Hopping of [facility] on the phone.  As the patient looks and feels well, Dr. Arlean Hopping advised giving him 1.5 g of vancomycin and discharging him.  Blood cultures will be followed after discharge.  Elevated LFTs Secondary to viral illness vs biliary issue. Patient has a history of fatty liver. LFTs trended down overnight Follow LFTs outpatient.  If they do not normalize quickly would recommend an ultrasound to rule out symptomatic gall bladder.   Diabetes  Resume home regimen  Hyperlipidemia  On statin.   End-stage renal disease  Stable.  Resume HD at home.  CIDP  Stable. Continue home medications.   Discharge Exam: Filed Vitals:   08/16/13 1036  BP: 129/64  Pulse:   Temp:   Resp:    General: A&O,  appears well Cardiovascular: rrr no m/r/g Respiratory: cta no w/c/r Abdomen:  Soft, nt, nd, +bs Extremities:  Left arm fistula in place with no signs of infection.  No  edema in extremities.  Discharge Instructions      Discharge Orders   Future Appointments Provider Department Dept Phone   08/21/2013 11:00 AM [facility] Room 9 [facility] [phone]   09/11/2013 12:00 PM York Spaniel, MD [facility] [phone]    Future Orders Complete By Expires   Diet general  As directed    Comments:     Renal   Increase activity slowly  As directed        Medication List         amLODipine 10 MG tablet  Commonly known as:  NORVASC  Take 5 mg by mouth daily.     aspirin EC 81 MG tablet  Take 162 mg by mouth daily.     calcitRIOL 0.25 MCG capsule  Commonly known as:  ROCALTROL  Take 0.25 mcg by mouth daily.     carvedilol 25 MG tablet  Commonly known as:  COREG  Take 25 mg by mouth 2 (two) times daily with a meal.     docusate sodium 250 MG capsule  Commonly known as:  COLACE  Take 1 capsule (250 mg total) by mouth 2 (two) times daily.     escitalopram 10 MG tablet  Commonly known as:  LEXAPRO  Take 20 mg by mouth daily.     fentaNYL 100 MCG/HR  Commonly known as:  DURAGESIC - dosed mcg/hr  Place 1 patch onto the skin every 3 (three) days.     insulin aspart 100 UNIT/ML injection  Commonly known as:  novoLOG  Inject 15 Units into the skin 3 (three) times daily before meals.     insulin detemir 100 UNIT/ML injection  Commonly known as:  LEVEMIR  Inject 45 Units into the skin at bedtime.     multivitamin Tabs tablet  Take 1 tablet by mouth daily.     polyethylene glycol packet  Commonly known as:  MIRALAX / GLYCOLAX  Take 17 g by mouth 3 (three) times a week.     rosuvastatin 40 MG tablet  Commonly known as:  CRESTOR  Take 1 tablet (40 mg total) by mouth 3 (three) times a week.     sevelamer carbonate 800 MG tablet  Commonly known as:  RENVELA  Take 2,400 mg by mouth 3 (three) times daily with meals. anc before snacks     tamsulosin 0.4 MG Caps capsule  Commonly known as:  FLOMAX  Take 0.4 mg by mouth daily.       Allergies  Allergen Reactions  . Zosyn [Piperacillin Sod-Tazobactam So] Itching and Rash   Follow-up Information   Follow up with Bosie Clos, MD In 3 days. (follow up blood cultures.)    Specialty:  Family Medicine   Contact information:   [address] [place] [address] [phone]        The results of significant diagnostics from this hospitalization (including imaging, microbiology, ancillary and laboratory) are listed below for reference.    Significant Diagnostic Studies: Dg Chest Portable 1 View  08/16/2013   CLINICAL DATA:  Emesis.  Bradycardia.  Right-sided pleural effusion.  EXAM: PORTABLE CHEST - 1 VIEW  COMPARISON:  None  FINDINGS: Mild right hemidiaphragm elevation. Numerous leads and wires project over the chest. Midline trachea. Borderline cardiomegaly. No pleural effusion or pneumothorax. Mild the low lung volumes. Clear lungs.  IMPRESSION: Low lung volumes, without acute disease.   Electronically Signed   By: Hosie Spangle.D.  On: 08/16/2013 00:26      Labs: Basic Metabolic Panel:  Recent Labs Lab 08/15/13 2300 08/16/13 0001 08/16/13 0609  NA 132* 131* 133*  K 3.9 3.7 4.4  CL 87* 91* 88*  CO2 28  --  23  GLUCOSE 212* 221* 249*  BUN 19 24* 28*  CREATININE 2.64* 2.80* 3.48*  CALCIUM 9.0  --  8.7   Liver Function Tests:  Recent Labs Lab 08/15/13 2300 08/16/13 0609  AST 121* 71*  ALT 152* 120*  ALKPHOS 216* 187*  BILITOT 0.6 0.5  PROT 8.1 7.2  ALBUMIN 3.4* 3.0*   CBC:  Recent Labs Lab 08/15/13 2300 08/16/13 0001 08/16/13 0609  WBC 15.5*  --  11.6*  HGB 14.7 16.0 13.1  HCT 41.6 47.0 37.8*  MCV 87.6  --  88.1  PLT 180  --  171     Recent Labs Lab 08/16/13 0750 08/16/13 1147  GLUCAP 229* 161*    Signed:  Conley CanalYork, Melanye Hiraldo L, PA-C [phone] [facility] 08/16/2013, 12:24 PM

## 2013-08-16 NOTE — ED Provider Notes (Signed)
CSN: [id number]     Arrival date & time 08/15/13  2229 History   First MD Initiated Contact with Patient 08/15/13 2311     Chief Complaint  Patient presents with  . lethargic after dialysis   . Emesis   (Consider location/radiation/quality/duration/timing/severity/associated sxs/prior Treatment) Patient is a 63 y.o. male presenting with vomiting.  Emesis Associated symptoms: no abdominal pain    History provided by patient. Does home hemodialysis, and after dialysis session tonight had period of generalized weakness and per his wife seemed lethargic. He had a measured fever to 101. He had nausea with one episode of vomiting. No bloody or bilious emesis. No abnormal pain. No diarrhea. No cough, sore throat. He has had some recent congestion and runny nose with postnasal drip. No rash. No recent travel. No known sick contacts. Symptoms moderate in severity.  Past Medical History  Diagnosis Date  . Bradycardia     Hx of  . Pleural effusion, right   . Hypomagnesemia   . Hypertension   . Diabetes mellitus   . Hyperlipidemia   . Coronary artery disease   . 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 MI (myocardial infarction) 1995    anterior  . CIDP (chronic inflammatory demyelinating polyneuropathy)   . Polyneuropathy in diabetes(357.2) 12/13/2012  . Abnormality of gait   . Foot drop, bilateral 05/10/2013  . Kidney disease   . End-stage renal disease   . Dialysis patient    Past Surgical History  Procedure Laterality Date  . Tonsillectomy    . Cardiac catheterization  1995    2 stents   . Coronary angioplasty with stent placement  1995  . Lithotripsy     Family History  Problem Relation Age of Onset  . Heart attack Father   . Hypertension Father   . Diabetes Father   . Cancer Father     prostate  . Hypertension Mother   . Cancer Maternal Grandmother     colon   History  Substance Use Topics  . Smoking status: Former Smoker    Types:  Cigarettes    Quit date: 08/17/1979  . Smokeless tobacco: Never Used  . Alcohol Use: No    Review of Systems  Constitutional: Positive for fever.  HENT: Positive for congestion.   Respiratory: Negative for shortness of breath.   Cardiovascular: Negative for chest pain.  Gastrointestinal: Positive for vomiting. Negative for abdominal pain.  Genitourinary: Negative for flank pain.  Musculoskeletal: Negative for back pain, neck pain and neck stiffness.  Skin: Negative for rash.  Neurological: Positive for weakness. Negative for syncope.  All other systems reviewed and are negative.    Allergies  Review of patient's allergies indicates no known allergies.  Home Medications   Current Outpatient Rx  Name  Route  Sig  Dispense  Refill  . amLODipine (NORVASC) 10 MG tablet   Oral   Take 5 mg by mouth daily.         Marland Kitchen. aspirin EC 81 MG tablet   Oral   Take 162 mg by mouth daily.         . calcitRIOL (ROCALTROL) 0.25 MCG capsule   Oral   Take 0.25 mcg by mouth daily.          . carvedilol (COREG) 25 MG tablet   Oral   Take 25 mg by mouth 2 (two) times daily with a meal.          .  docusate sodium (COLACE) 250 MG capsule   Oral   Take 1 capsule (250 mg total) by mouth 2 (two) times daily.   60 capsule   1   . escitalopram (LEXAPRO) 10 MG tablet   Oral   Take 20 mg by mouth daily.          . fentaNYL (DURAGESIC - DOSED MCG/HR) 100 MCG/HR   Transdermal   Place 1 patch onto the skin every 3 (three) days.         . insulin aspart (NOVOLOG) 100 UNIT/ML injection   Subcutaneous   Inject 15 Units into the skin 3 (three) times daily before meals.          . insulin detemir (LEVEMIR) 100 UNIT/ML injection   Subcutaneous   Inject 45 Units into the skin at bedtime.          . multivitamin (RENA-VIT) TABS tablet   Oral   Take 1 tablet by mouth daily.         . polyethylene glycol (MIRALAX / GLYCOLAX) packet   Oral   Take 17 g by mouth 3 (three) times a  week.         . rosuvastatin (CRESTOR) 40 MG tablet   Oral   Take 1 tablet (40 mg total) by mouth 3 (three) times a week.   30 tablet   1   . sevelamer carbonate (RENVELA) 800 MG tablet   Oral   Take 2,400 mg by mouth 3 (three) times daily with meals. anc before snacks         . tamsulosin (FLOMAX) 0.4 MG CAPS   Oral   Take 0.4 mg by mouth daily.           BP 96/51  Pulse 72  Temp(Src) 98.8 F (37.1 C) (Oral)  Resp 17  Wt 172 lb 5 oz (78.16 kg)  SpO2 95% Physical Exam  Constitutional: He is oriented to person, place, and time. He appears well-developed and well-nourished.  HENT:  Head: Normocephalic and atraumatic.  Mouth/Throat: Oropharynx is clear and moist. No oropharyngeal exudate.  Eyes: EOM are normal. Pupils are equal, round, and reactive to light.  Neck: Neck supple.  Cardiovascular: Normal rate, regular rhythm and intact distal pulses.   Pulmonary/Chest: Effort normal and breath sounds normal. No respiratory distress. He exhibits no tenderness.  Abdominal: Soft. He exhibits no distension. There is no tenderness.  Musculoskeletal: Normal range of motion. He exhibits no edema.  Neurological: He is alert and oriented to person, place, and time.  Skin: Skin is warm and dry.    ED Course  Procedures (including critical care time) Labs Review Labs Reviewed  CBC - Abnormal; Notable for the following:    WBC 15.5 (*)    All other components within normal limits  COMPREHENSIVE METABOLIC PANEL - Abnormal; Notable for the following:    Sodium 132 (*)    Chloride 87 (*)    Glucose, Bld 212 (*)    Creatinine, Ser 2.64 (*)    Albumin 3.4 (*)    AST 121 (*)    ALT 152 (*)    Alkaline Phosphatase 216 (*)    GFR calc non Af Amer 24 (*)    GFR calc Af Amer 28 (*)    All other components within normal limits  POCT I-STAT, CHEM 8 - Abnormal; Notable for the following:    Sodium 131 (*)    Chloride 91 (*)    BUN 24 (*)  Creatinine, Ser 2.80 (*)    Glucose,  Bld 221 (*)    Calcium, Ion 1.05 (*)    All other components within normal limits  CULTURE, BLOOD (ROUTINE X 2)  CULTURE, BLOOD (ROUTINE X 2)  INFLUENZA PANEL BY PCR  URINALYSIS, ROUTINE W REFLEX MICROSCOPIC  INFLUENZA PANEL BY PCR   Imaging Review Dg Chest Portable 1 View  08/16/2013   CLINICAL DATA:  Emesis.  Bradycardia.  Right-sided pleural effusion.  EXAM: PORTABLE CHEST - 1 VIEW  COMPARISON:  None  FINDINGS: Mild right hemidiaphragm elevation. Numerous leads and wires project over the chest. Midline trachea. Borderline cardiomegaly. No pleural effusion or pneumothorax. Mild the low lung volumes. Clear lungs.  IMPRESSION: Low lung volumes, without acute disease.   Electronically Signed   By: Jeronimo Greaves M.D.   On: 08/16/2013 00:26    EKG Interpretation   None      Tylenol provided. Zofran provided  On recheck, patient diaphoretic with systolic blood pressure now the 90s. No new complaints otherwise. Labs and imaging reviewed as above. Patient does make some urine but unable to tonight. Blood cultures obtained. Medicine consulted. Tamiflu provided. Influenza pending. IV antibiotics.  2:32 AM d/w Dr Betti Cruz - he will evaluate bedside MDM  Dx: Fever, vomiting Blood cultures obtained and IV antibiotics provided. Patient developed a rash and flushing after IV Zosyn and the medication was stopped. Benadryl given IV. MED admission.  Sunnie Nielsen, MD 08/16/13 [phone]

## 2013-08-16 NOTE — H&P (Addendum)
Patient's PCP: Bosie ClosGILBERT,RICHARD L, MD  Chief Complaint: Altered mental status and fever  History of Present Illness: Daniel Anthony is a 63 y.o. Caucasian male with history of end-stage renal disease on home hemodialysis, hypertension, hyperlipidemia, diabetes, chronic inflammatory demyelinating polyneuropathy, and coronary artery disease who presents with the above complaints.  Patient reported that he had about 3.1 L of fluid removed yesterday at home during dialysis after he weighed himself.  This amount of fluid removal is higher than normal.  At the end of the dialysis session, he was "lethargic"as per his significant other.  He also checked his temperature initially was 100.2 but increased to 101.5.  He called [facility], who instructed the patient to come to the emergency department for further evaluation.  In the emergency department, he was initially hypotensive with blood pressure of 90s over 50s but improved without any intervention to 120s over 60s.  He also had a low-grade temperature of 100.  Hospitalist service was called to admit the patient.  In the emergency department, he was started on Zosyn, but developed generalized itching for which she was given Benadryl and the antibiotic was discontinued.  Prior to yesterday, has not had any fevers or chills.  Denies any chest pain or shortness of breath.  Denies any abdominal pain or diarrhea.  Review of Systems: All systems reviewed with the patient and positive as per history of present illness, otherwise all other systems are negative.  Past Medical History  Diagnosis Date  . Bradycardia     Hx of  . Pleural effusion, right   . Hypomagnesemia   . Hypertension   . Diabetes mellitus   . Hyperlipidemia   . Coronary artery disease   . 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 MI (myocardial infarction) 1995    anterior  . CIDP (chronic inflammatory demyelinating polyneuropathy)   .  Polyneuropathy in diabetes(357.2) 12/13/2012  . Abnormality of gait   . Foot drop, bilateral 05/10/2013  . Kidney disease   . End-stage renal disease   . Dialysis patient    Past Surgical History  Procedure Laterality Date  . Tonsillectomy    . Cardiac catheterization  1995    2 stents   . Coronary angioplasty with stent placement  1995  . Lithotripsy     Family History  Problem Relation Age of Onset  . Heart attack Father   . Hypertension Father   . Diabetes Father   . Cancer Father     prostate  . Hypertension Mother   . Cancer Maternal Grandmother     colon   History   Social History  . Marital Status: Married    Spouse Name: [name]    Number of Children: 1  . Years of Education: college   Occupational History  .      RetiredSystems developer- Doctor   Social History Main Topics  . Smoking status: Former Smoker    Types: Cigarettes    Quit date: 08/17/1979  . Smokeless tobacco: Never Used  . Alcohol Use: No  . Drug Use: No  . Sexual Activity: Not on file   Other Topics Concern  . Not on file   Social History Narrative   ** Merged History Encounter **    Patient lives at home with his wife Clydie Braun([name]). Patient is retired. Patient has college education.   Right handed.   Caffeine- None   Allergies: Zosyn  Home Meds: Prior to Admission  medications   Medication Sig Start Date End Date Taking? Authorizing Provider  amLODipine (NORVASC) 10 MG tablet Take 5 mg by mouth daily. 10/27/12  Yes Vassie Loll, MD  aspirin EC 81 MG tablet Take 162 mg by mouth daily.   Yes Historical Provider, MD  calcitRIOL (ROCALTROL) 0.25 MCG capsule Take 0.25 mcg by mouth daily.    Yes Historical Provider, MD  carvedilol (COREG) 25 MG tablet Take 25 mg by mouth 2 (two) times daily with a meal.    Yes Historical Provider, MD  docusate sodium (COLACE) 250 MG capsule Take 1 capsule (250 mg total) by mouth 2 (two) times daily. 10/27/12  Yes Vassie Loll, MD  escitalopram (LEXAPRO) 10 MG tablet Take 20  mg by mouth daily.    Yes Historical Provider, MD  fentaNYL (DURAGESIC - DOSED MCG/HR) 100 MCG/HR Place 1 patch onto the skin every 3 (three) days.   Yes Historical Provider, MD  insulin aspart (NOVOLOG) 100 UNIT/ML injection Inject 15 Units into the skin 3 (three) times daily before meals.    Yes Historical Provider, MD  insulin detemir (LEVEMIR) 100 UNIT/ML injection Inject 45 Units into the skin at bedtime.    Yes Historical Provider, MD  multivitamin (RENA-VIT) TABS tablet Take 1 tablet by mouth daily.   Yes Historical Provider, MD  polyethylene glycol (MIRALAX / GLYCOLAX) packet Take 17 g by mouth 3 (three) times a week.   Yes Historical Provider, MD  rosuvastatin (CRESTOR) 40 MG tablet Take 1 tablet (40 mg total) by mouth 3 (three) times a week. 07/16/13  Yes Antonieta Iba, MD  sevelamer carbonate (RENVELA) 800 MG tablet Take 2,400 mg by mouth 3 (three) times daily with meals. anc before snacks   Yes Historical Provider, MD  tamsulosin (FLOMAX) 0.4 MG CAPS Take 0.4 mg by mouth daily.    Yes Historical Provider, MD    Physical Exam: Blood pressure 147/70, pulse 91, temperature 98.8 F (37.1 C), temperature source Oral, resp. rate 20, weight 78.16 kg (172 lb 5 oz), SpO2 97.00%. General: Awake, Oriented x3, No acute distress, slightly diaphoretic. HEENT: EOMI, Moist mucous membranes Neck: Supple CV: S1 and S2 Lungs: Clear to ascultation bilaterally Abdomen: Soft, Nontender, Nondistended, +bowel sounds. Ext: Good pulses. Trace edema. No clubbing or cyanosis noted. Neuro: Cranial Nerves II-XII grossly intact. Has 5/5 motor strength in upper and lower extremities.  Lab results:  Recent Labs  08/15/13 2300 08/16/13 0001  NA 132* 131*  K 3.9 3.7  CL 87* 91*  CO2 28  --   GLUCOSE 212* 221*  BUN 19 24*  CREATININE 2.64* 2.80*  CALCIUM 9.0  --     Recent Labs  08/15/13 2300  AST 121*  ALT 152*  ALKPHOS 216*  BILITOT 0.6  PROT 8.1  ALBUMIN 3.4*   No results found for this  basename: LIPASE, AMYLASE,  in the last 72 hours  Recent Labs  08/15/13 2300 08/16/13 0001  WBC 15.5*  --   HGB 14.7 16.0  HCT 41.6 47.0  MCV 87.6  --   PLT 180  --    No results found for this basename: CKTOTAL, CKMB, CKMBINDEX, TROPONINI,  in the last 72 hours No components found with this basename: POCBNP,  No results found for this basename: DDIMER,  in the last 72 hours No results found for this basename: HGBA1C,  in the last 72 hours No results found for this basename: CHOL, HDL, LDLCALC, TRIG, CHOLHDL, LDLDIRECT,  in the last 72  hours No results found for this basename: TSH, T4TOTAL, FREET3, T3FREE, THYROIDAB,  in the last 72 hours No results found for this basename: VITAMINB12, FOLATE, FERRITIN, TIBC, IRON, RETICCTPCT,  in the last 72 hours Imaging results:  Dg Chest Portable 1 View  08/16/2013   CLINICAL DATA:  Emesis.  Bradycardia.  Right-sided pleural effusion.  EXAM: PORTABLE CHEST - 1 VIEW  COMPARISON:  None  FINDINGS: Mild right hemidiaphragm elevation. Numerous leads and wires project over the chest. Midline trachea. Borderline cardiomegaly. No pleural effusion or pneumothorax. Mild the low lung volumes. Clear lungs.  IMPRESSION: Low lung volumes, without acute disease.   Electronically Signed   By: Jeronimo Greaves M.D.   On: 08/16/2013 00:26   Assessment & Plan by Problem: Altered mental status/acute encephalopathy Etiology unclear.  Resolved.  May be related to hypotension after dialysis from fluid equilibration.  Patient is neurologically intact.  Chest x-ray shows no cardiopulmonary findings.  Hold further workup, if any further episodes of altered mental status consider further workup at that time.  Fever Again etiology unclear.  May be due to electrolyte shifts in the body from dialysis?  Influenza PCR pending.  Blood cultures x2 drawn and pending.  Hold all antibiotics.  If any further episodes of fever, low threshold to starting antibiotics.  Also likely had an allergic  reaction to Zosyn in the emergency department which was added as one of his allergies.  Diabetes Continue home insulin regimen.  Sliding scale insulin.  Diabetic diet.  Hyperlipidemia On statin.  Elevated liver function tests Etiology unclear.  May be related to mild hypotension from dialysis.  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  Continue statin for now.  End-stage renal disease If the patient needs hospitalization for longer than 24 hours, please call nephrology consultation for dialysis next due on 08/17/2012.  Leukocytosis Workup as indicated above.  CIDP Stable.  Continue home medications.  Prophylaxis Heparin subcutaneous.  CODE STATUS Full code.  Disposition Admit the patient to MedSurg as observation.  If no further episodes of altered mental status or fever, and if the workup is negative consider discharge with next 24 hours.  Time spent on admission, talking to the patient, and coordinating care was: 50 mins.  Alucard Fearnow A, MD 08/16/2013, 4:04 AM

## 2013-08-16 NOTE — Discharge Instructions (Signed)
Follow up with your primary care provider or nephrologist in 2-3 days.  Ask them to check the final blood culture results in EPIC and to check your LFTs.   If your LFTs are still elevated you may need an ultrasound to check your gallbladder.

## 2013-08-16 NOTE — ED Notes (Signed)
MD at bedside. 

## 2013-08-16 NOTE — ED Notes (Signed)
Pt reported having an allergic reaction to Zosyn. Pt reports itching and feeling flush. Pt denies shortness of breath, and chest pain. VSS. Zosyn stopped. Dr. Dierdre Highmanpitz made aware

## 2013-08-16 NOTE — Progress Notes (Signed)
Arrived by stretcher  To room oriented to rm

## 2013-08-16 NOTE — ED Notes (Signed)
Admitting MD at bedside.

## 2013-08-16 NOTE — ED Notes (Addendum)
Pt is aware he needs a urine sample but states he only goes once a day and he has already been once today

## 2013-08-16 NOTE — Progress Notes (Signed)
Nsg Discharge Note  Admit Date:  08/15/2013 Discharge date: 08/16/2013   Daniel Anthony to be D/C'd Home per MD order.  AVS completed.  Copy for chart, and copy for patient signed, and dated. Patient/caregiver able to verbalize understanding.  Discharge Medication:   Medication List         amLODipine 10 MG tablet  Commonly known as:  NORVASC  Take 5 mg by mouth daily.     aspirin EC 81 MG tablet  Take 162 mg by mouth daily.     calcitRIOL 0.25 MCG capsule  Commonly known as:  ROCALTROL  Take 0.25 mcg by mouth daily.     carvedilol 25 MG tablet  Commonly known as:  COREG  Take 25 mg by mouth 2 (two) times daily with a meal.     docusate sodium 250 MG capsule  Commonly known as:  COLACE  Take 1 capsule (250 mg total) by mouth 2 (two) times daily.     escitalopram 10 MG tablet  Commonly known as:  LEXAPRO  Take 20 mg by mouth daily.     fentaNYL 100 MCG/HR  Commonly known as:  DURAGESIC - dosed mcg/hr  Place 1 patch onto the skin every 3 (three) days.     insulin aspart 100 UNIT/ML injection  Commonly known as:  novoLOG  Inject 15 Units into the skin 3 (three) times daily before meals.     insulin detemir 100 UNIT/ML injection  Commonly known as:  LEVEMIR  Inject 45 Units into the skin at bedtime.     multivitamin Tabs tablet  Take 1 tablet by mouth daily.     polyethylene glycol packet  Commonly known as:  MIRALAX / GLYCOLAX  Take 17 g by mouth 3 (three) times a week.     rosuvastatin 40 MG tablet  Commonly known as:  CRESTOR  Take 1 tablet (40 mg total) by mouth 3 (three) times a week.     sevelamer carbonate 800 MG tablet  Commonly known as:  RENVELA  Take 2,400 mg by mouth 3 (three) times daily with meals. anc before snacks     tamsulosin 0.4 MG Caps capsule  Commonly known as:  FLOMAX  Take 0.4 mg by mouth daily.        Discharge Assessment: Filed Vitals:   08/16/13 1504  BP: 136/65  Pulse: 63  Temp: 99 F (37.2 C)  Resp: 16   Skin clean,  dry and intact without evidence of skin break down, no evidence of skin tears noted. IV catheter discontinued intact. Site without signs and symptoms of complications - no redness or edema noted at insertion site, patient denies c/o pain - only slight tenderness at site.  Dressing with slight pressure applied.  D/c Instructions-Education: Discharge instructions given to patient/family with verbalized understanding. D/c education completed with patient/family including follow up instructions, medication list, d/c activities limitations if indicated, with other d/c instructions as indicated by MD - patient able to verbalize understanding, all questions fully answered. Patient instructed to return to ED, call 911, or call MD for any changes in condition.  Patient escorted via WC, and D/C home via private auto.  Kern ReapBrumagin, Daniel Anthony L, RN 08/16/2013 6:06 PM

## 2013-08-16 NOTE — ED Notes (Signed)
X-ray at bedside

## 2013-08-21 ENCOUNTER — Inpatient Hospital Stay (HOSPITAL_COMMUNITY): Admission: RE | Admit: 2013-08-21 | Payer: Medicare Other | Source: Ambulatory Visit

## 2013-08-22 LAB — CULTURE, BLOOD (ROUTINE X 2)
Culture: NO GROWTH
Culture: NO GROWTH

## 2013-08-28 ENCOUNTER — Inpatient Hospital Stay (HOSPITAL_COMMUNITY): Admission: RE | Admit: 2013-08-28 | Payer: Medicare Other | Source: Ambulatory Visit

## 2013-08-30 ENCOUNTER — Encounter (HOSPITAL_COMMUNITY)
Admission: RE | Admit: 2013-08-30 | Discharge: 2013-08-30 | Disposition: A | Discharge status: Home / Self Care | Payer: Medicare Other | Source: Ambulatory Visit | Attending: Nephrology | Admitting: Nephrology

## 2013-08-30 VITALS — BP 156/67 | HR 61 | Temp 98.4°F | Resp 20 | Ht 68.0 in | Wt 172.0 lb

## 2013-08-30 DIAGNOSIS — G6181 Chronic inflammatory demyelinating polyneuritis: Secondary | ICD-10-CM | POA: Insufficient documentation

## 2013-08-30 MED ORDER — ACETAMINOPHEN 325 MG PO TABS: 650.0000 mg | ORAL_TABLET | ORAL | Status: DC

## 2013-08-30 MED ORDER — IMMUNE GLOBULIN (HUMAN) 10 GM/100ML IV SOLN
400.0000 mg/kg | INTRAVENOUS | Status: DC
  Administered 2013-08-30: 30 g via INTRAVENOUS
  Filled 2013-08-30: qty 300

## 2013-08-30 MED ORDER — DIPHENHYDRAMINE HCL 50 MG/ML IJ SOLN: 25.0000 mg | INTRAMUSCULAR | Status: DC

## 2013-08-30 MED ORDER — DEXTROSE 5 % IV SOLN
INTRAVENOUS | Status: DC
  Administered 2013-08-30: 12:00:00 via INTRAVENOUS

## 2013-09-11 ENCOUNTER — Encounter: Payer: Self-pay | Admitting: Neurology

## 2013-09-11 ENCOUNTER — Ambulatory Visit (INDEPENDENT_AMBULATORY_CARE_PROVIDER_SITE_OTHER): Payer: Medicare Other | Admitting: Neurology

## 2013-09-11 VITALS — BP 142/73 | HR 74 | Wt 174.0 lb

## 2013-09-11 DIAGNOSIS — M216X9 Other acquired deformities of unspecified foot: Secondary | ICD-10-CM

## 2013-09-11 DIAGNOSIS — M21372 Foot drop, left foot: Secondary | ICD-10-CM

## 2013-09-11 DIAGNOSIS — R269 Unspecified abnormalities of gait and mobility: Secondary | ICD-10-CM

## 2013-09-11 DIAGNOSIS — M21371 Foot drop, right foot: Secondary | ICD-10-CM

## 2013-09-11 DIAGNOSIS — E1142 Type 2 diabetes mellitus with diabetic polyneuropathy: Secondary | ICD-10-CM

## 2013-09-11 NOTE — Progress Notes (Signed)
Reason for visit: Peripheral neuropathy  Daniel Anthony is an 63 y.o. 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 The patient has bilateral foot drops, and he was able to obtain AFO braces bilaterally that have helped his gait stability and safety. The patient however, has fallen on several occasions. The patient uses a cane inside and outside the house. The patient has a rolling walker, but he has not consistently used this outside the house. The patient has some increased numbness of the hands since last seen, and he believes that the strength of the left triceps has improved slightly on IVIG, but the right triceps has worsened. The patient has undergone some therapy with good improvement. The patient has some muscle pain when the weather is cold. The patient feels numbness up to the knees at times, and up to the wrists bilaterally. The patient indicates that his balance issues are his primary problem.  Past Medical History  Diagnosis Date  . Bradycardia     Hx of  . Pleural effusion, right   . Hypomagnesemia   . Hypertension   . Diabetes mellitus   . Hyperlipidemia   . Coronary artery disease   . 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 MI (myocardial infarction) 1995    anterior  . CIDP (chronic inflammatory demyelinating polyneuropathy)   . Polyneuropathy in diabetes(357.2) 12/13/2012  . Abnormality of gait   . Foot drop, bilateral 05/10/2013  . Kidney disease   . End-stage renal disease   . Dialysis patient     Past Surgical History  Procedure Laterality Date  . Tonsillectomy    . Cardiac catheterization  1995    2 stents   . Coronary angioplasty with stent placement  1995  . Lithotripsy      Family History  Problem Relation Age of Onset  . Heart attack Father   . Hypertension  Father   . Diabetes Father   . Cancer Father     prostate  . Hypertension Mother   . Cancer Maternal Grandmother     colon    Social history:  reports that he quit smoking about 34 years ago. His smoking use included Cigarettes. He smoked 0.00 packs per day. He has never used smokeless tobacco. He reports that he does not drink alcohol or use illicit drugs.    Allergies  Allergen Reactions  . Zosyn [Piperacillin Sod-Tazobactam So] Itching and Rash    Medications:  Current Outpatient Prescriptions on File Prior to Visit  Medication Sig Dispense Refill  . amLODipine (NORVASC) 10 MG tablet Take 10 mg by mouth daily.       . calcitRIOL (ROCALTROL) 0.25 MCG capsule Take 0.25 mcg by mouth daily.       . carvedilol (COREG) 25 MG tablet Take 25 mg by mouth 2 (two) times daily with a meal.       . docusate sodium (COLACE) 250 MG capsule Take 1 capsule (250 mg total) by mouth 2 (two) times daily.  60 capsule  1  . fentaNYL (DURAGESIC - DOSED MCG/HR) 100 MCG/HR Place 1 patch onto the skin every 3 (three) days.      . insulin aspart (NOVOLOG) 100 UNIT/ML injection Inject 15 Units into the skin 3 (three) times daily before meals.       Marland Kitchen  insulin detemir (LEVEMIR) 100 UNIT/ML injection Inject 45 Units into the skin at bedtime.       . multivitamin (RENA-VIT) TABS tablet Take 1 tablet by mouth daily.      . polyethylene glycol (MIRALAX / GLYCOLAX) packet Take 17 g by mouth 3 (three) times a week.      . rosuvastatin (CRESTOR) 40 MG tablet Take 1 tablet (40 mg total) by mouth 3 (three) times a week.  30 tablet  1  . sevelamer carbonate (RENVELA) 800 MG tablet Take 2,400 mg by mouth 3 (three) times daily with meals. anc before snacks      . tamsulosin (FLOMAX) 0.4 MG CAPS Take 0.4 mg by mouth daily.        No current facility-administered medications on file prior to visit.    ROS:  Out of a complete 14 system review of symptoms, the patient complains only of the following symptoms, and all  other reviewed systems are negative.  Fatigue Neck pain Nausea Cold intolerance  Decreased urine output, on dialysis Achy muscles, walking difficulties, coordination problems Anemia Memory loss, weakness, tremors Anxiety  Blood pressure 142/73, pulse 74, weight 174 lb (78.926 kg).  Physical Exam  General: The patient is alert and cooperative at the time of the examination.  Skin: No significant peripheral edema is noted.   Neurologic Exam  Mental status: The patient is oriented x 3. Mini-Mental status examination done today shows a total score of 30/30.  Cranial nerves: Facial symmetry is present. Speech is normal, no aphasia or dysarthria is noted. Extraocular movements are full. Visual fields are full.  Motor: The patient has 3/5 strength of the triceps muscles bilaterally. The patient has weakness of the intrinsic muscles of the hands bilaterally, wasting of the interossei. The patient has a} on both feet, 4/5 strength proximally in both legs, 4+/5 strength with knee extension bilaterally.  Sensory examination: The patient has symmetric sensation on the hands and face.  Coordination: The patient has good finger-nose-finger and heel-to-shin bilaterally.  Gait and station: The patient a wide-based gait. The patient walks with a cane. Tandem gait was not attempted. Romberg is positive. No drift is seen.  Reflexes: Deep tendon reflexes are symmetric, depressed.   Assessment/Plan:  1. Diabetes  2. Diabetic peripheral neuropathy  3. Gait disorder  The patient will consider using a rolling walker when outside the house. The patient will continue the IVIG at this point. The last time he used IVIG, he took 2 years to get good benefit. The patient clearly is weaker with a right triceps muscle. The patient followup in 4-6 months.  Marlan Palau. Keith Willis MD 09/11/2013 8:51 PM  [facility] [address] [place], [place] [id number]  Phone  [phone] Fax [phone]

## 2013-09-11 NOTE — Patient Instructions (Signed)

## 2013-09-27 ENCOUNTER — Inpatient Hospital Stay (HOSPITAL_COMMUNITY): Admission: RE | Admit: 2013-09-27 | Payer: Medicare Other | Source: Ambulatory Visit

## 2013-10-04 ENCOUNTER — Inpatient Hospital Stay (HOSPITAL_COMMUNITY): Admission: RE | Admit: 2013-10-04 | Payer: Medicare Other | Source: Ambulatory Visit

## 2013-10-08 ENCOUNTER — Encounter (HOSPITAL_COMMUNITY)
Admission: RE | Admit: 2013-10-08 | Discharge: 2013-10-08 | Disposition: A | Discharge status: Home / Self Care | Payer: Medicare Other | Source: Ambulatory Visit | Attending: Nephrology | Admitting: Nephrology

## 2013-10-08 VITALS — BP 162/51 | HR 66 | Temp 97.6°F | Resp 20 | Ht 68.0 in | Wt 180.0 lb

## 2013-10-08 DIAGNOSIS — G6181 Chronic inflammatory demyelinating polyneuritis: Secondary | ICD-10-CM | POA: Insufficient documentation

## 2013-10-08 MED ORDER — IMMUNE GLOBULIN (HUMAN) 10 GM/100ML IV SOLN
400.0000 mg/kg | INTRAVENOUS | Status: DC
  Administered 2013-10-08: 30 g via INTRAVENOUS
  Filled 2013-10-08: qty 300

## 2013-10-08 MED ORDER — METHYLPREDNISOLONE SODIUM SUCC 1000 MG IJ SOLR
500.0000 mg | INTRAMUSCULAR | Status: DC
  Filled 2013-10-08: qty 4

## 2013-10-08 MED ORDER — DEXTROSE 5 % IV SOLN
INTRAVENOUS | Status: DC
  Administered 2013-10-08: 10 mL via INTRAVENOUS

## 2013-10-08 MED ORDER — ACETAMINOPHEN 325 MG PO TABS: 650.0000 mg | ORAL_TABLET | ORAL | Status: DC

## 2013-10-08 MED ORDER — DIPHENHYDRAMINE HCL 50 MG/ML IJ SOLN: 25.0000 mg | INTRAMUSCULAR | Status: DC

## 2013-11-05 ENCOUNTER — Inpatient Hospital Stay (HOSPITAL_COMMUNITY): Admission: RE | Admit: 2013-11-05 | Payer: Medicare Other | Source: Ambulatory Visit

## 2013-11-15 ENCOUNTER — Telehealth: Payer: Self-pay | Admitting: *Deleted

## 2013-11-15 ENCOUNTER — Encounter (HOSPITAL_COMMUNITY)
Admission: RE | Admit: 2013-11-15 | Discharge: 2013-11-15 | Disposition: A | Discharge status: Home / Self Care | Payer: Medicare Other | Source: Ambulatory Visit | Attending: Nephrology | Admitting: Nephrology

## 2013-11-15 VITALS — BP 169/66 | HR 64 | Temp 98.5°F | Resp 20 | Ht 68.0 in | Wt 180.0 lb

## 2013-11-15 DIAGNOSIS — G6181 Chronic inflammatory demyelinating polyneuritis: Secondary | ICD-10-CM

## 2013-11-15 MED ORDER — IMMUNE GLOBULIN (HUMAN) 10 GM/100ML IV SOLN
30.0000 g | INTRAVENOUS | Status: DC
  Administered 2013-11-15: 30 g via INTRAVENOUS
  Filled 2013-11-15: qty 300

## 2013-11-15 MED ORDER — DIPHENHYDRAMINE HCL 50 MG/ML IJ SOLN: 25.0000 mg | INTRAMUSCULAR | Status: DC

## 2013-11-15 MED ORDER — IMMUNE GLOBULIN (HUMAN) 10 GM/100ML IV SOLN
400.0000 mg/kg | INTRAVENOUS | Status: DC
  Filled 2013-11-15: qty 350

## 2013-11-15 MED ORDER — ACETAMINOPHEN 325 MG PO TABS: 650.0000 mg | ORAL_TABLET | ORAL | Status: DC

## 2013-11-15 MED ORDER — SODIUM CHLORIDE 0.9 % IV SOLN
500.0000 mg | INTRAVENOUS | Status: DC
  Filled 2013-11-15: qty 4

## 2013-11-15 MED ORDER — IMMUNE GLOBULIN (HUMAN) 10 GM/100ML IV SOLN
30.0000 g | INTRAVENOUS | Status: DC
  Filled 2013-11-15: qty 300

## 2013-11-15 MED ORDER — DEXTROSE 5 % IV SOLN
INTRAVENOUS | Status: DC
  Administered 2013-11-15: 500 mL via INTRAVENOUS

## 2013-11-15 NOTE — Telephone Encounter (Signed)
Jasmine DecemberSharon, RN from [facility] [phone] and pt is there now getting IVIG.  She has several questions about the IVIG orders, premeds and rescue drugs.  I consulted Dr. Anne HahnWillis.  Pt should be getting Privigen 400mg  / kg x 1 day every 28 days.(approx 30-35gms).    Solumedrol 500mg  IV prn for allergic reaction, and Tylenol 1000mg  po /Benadryl 50 mg premed, (may take at home prior to infusion).  Harriett SineNancy, RN will place order for next infusion.

## 2013-12-12 ENCOUNTER — Other Ambulatory Visit (HOSPITAL_COMMUNITY): Payer: Self-pay | Admitting: *Deleted

## 2013-12-13 ENCOUNTER — Encounter (HOSPITAL_COMMUNITY)
Admission: RE | Admit: 2013-12-13 | Discharge: 2013-12-13 | Disposition: A | Discharge status: Home / Self Care | Payer: Medicare Other | Source: Ambulatory Visit | Attending: Neurology | Admitting: Neurology

## 2013-12-13 MED ORDER — DIPHENHYDRAMINE HCL 25 MG PO TABS
25.0000 mg | ORAL_TABLET | ORAL | Status: DC
  Filled 2013-12-13: qty 1

## 2013-12-13 MED ORDER — METHYLPREDNISOLONE SODIUM SUCC 125 MG IJ SOLR: 500.0000 mg | INTRAMUSCULAR | Status: DC | PRN

## 2013-12-13 MED ORDER — SODIUM CHLORIDE 0.9 % IV SOLN
500.0000 mg | INTRAVENOUS | Status: DC | PRN
  Filled 2013-12-13: qty 4

## 2013-12-13 MED ORDER — IMMUNE GLOBULIN (HUMAN) 1 GM/10ML IV SOLN: 400.0000 mg/kg | INTRAVENOUS | Status: DC

## 2013-12-13 MED ORDER — ACETAMINOPHEN 325 MG PO TABS: 650.0000 mg | ORAL_TABLET | ORAL | Status: DC

## 2013-12-13 MED ORDER — IMMUNE GLOBULIN (HUMAN) 10 GM/100ML IV SOLN
400.0000 mg/kg | Freq: Once | INTRAVENOUS | Status: AC
  Administered 2013-12-13: 30 g via INTRAVENOUS
  Filled 2013-12-13: qty 300

## 2013-12-13 MED ORDER — DEXTROSE 5 % IV SOLN
INTRAVENOUS | Status: DC
  Administered 2013-12-13: 13:00:00 via INTRAVENOUS

## 2014-01-04 ENCOUNTER — Other Ambulatory Visit (HOSPITAL_COMMUNITY): Payer: Self-pay | Admitting: *Deleted

## 2014-01-08 ENCOUNTER — Encounter (HOSPITAL_COMMUNITY)
Admission: RE | Admit: 2014-01-08 | Discharge: 2014-01-08 | Disposition: A | Discharge status: Home / Self Care | Payer: Medicare Other | Source: Ambulatory Visit | Attending: Nephrology | Admitting: Nephrology

## 2014-01-08 VITALS — BP 150/67 | HR 60 | Temp 98.4°F | Resp 20 | Ht 68.0 in | Wt 172.0 lb

## 2014-01-08 DIAGNOSIS — G6181 Chronic inflammatory demyelinating polyneuritis: Secondary | ICD-10-CM | POA: Diagnosis present

## 2014-01-08 MED ORDER — ACETAMINOPHEN 325 MG PO TABS: 650.0000 mg | ORAL_TABLET | ORAL | Status: DC

## 2014-01-08 MED ORDER — IMMUNE GLOBULIN (HUMAN) 1 GM/10ML IV SOLN
400.0000 mg/kg | INTRAVENOUS | Status: DC
  Filled 2014-01-08: qty 312

## 2014-01-08 MED ORDER — DIPHENHYDRAMINE HCL 25 MG PO TABS
25.0000 mg | ORAL_TABLET | ORAL | Status: DC
  Filled 2014-01-08: qty 1

## 2014-01-08 MED ORDER — IMMUNE GLOBULIN (HUMAN) 10 GM/100ML IV SOLN
400.0000 mg/kg | INTRAVENOUS | Status: AC
  Administered 2014-01-08: 30 g via INTRAVENOUS
  Filled 2014-01-08: qty 300

## 2014-01-08 MED ORDER — DEXTROSE 5 % IV SOLN
INTRAVENOUS | Status: DC
  Administered 2014-01-08: 12:00:00 via INTRAVENOUS

## 2014-01-08 MED ORDER — IMMUNE GLOBULIN (HUMAN) 10 GM/100ML IV SOLN
400.0000 mg/kg | Freq: Once | INTRAVENOUS | Status: DC
  Filled 2014-01-08: qty 300

## 2014-02-05 ENCOUNTER — Encounter (HOSPITAL_COMMUNITY)
Admission: RE | Admit: 2014-02-05 | Discharge: 2014-02-05 | Disposition: A | Discharge status: Home / Self Care | Payer: Medicare Other | Source: Ambulatory Visit | Attending: Nephrology | Admitting: Nephrology

## 2014-02-05 VITALS — BP 198/70 | HR 76 | Temp 98.9°F | Resp 20 | Wt 172.0 lb

## 2014-02-05 DIAGNOSIS — G6181 Chronic inflammatory demyelinating polyneuritis: Secondary | ICD-10-CM

## 2014-02-05 MED ORDER — IMMUNE GLOBULIN (HUMAN) 10 GM/100ML IV SOLN
400.0000 mg/kg | INTRAVENOUS | Status: DC
  Filled 2014-02-05: qty 300

## 2014-02-05 MED ORDER — IMMUNE GLOBULIN (HUMAN) 10 GM/100ML IV SOLN
400.0000 mg/kg | Freq: Once | INTRAVENOUS | Status: AC
  Administered 2014-02-05: 30 g via INTRAVENOUS
  Filled 2014-02-05: qty 300

## 2014-02-05 MED ORDER — ACETAMINOPHEN 325 MG PO TABS: 650.0000 mg | ORAL_TABLET | ORAL | Status: DC

## 2014-02-05 MED ORDER — DIPHENHYDRAMINE HCL 25 MG PO TABS
25.0000 mg | ORAL_TABLET | ORAL | Status: DC
  Filled 2014-02-05: qty 1

## 2014-02-05 MED ORDER — DEXTROSE 5 % IV SOLN
INTRAVENOUS | Status: DC
  Administered 2014-02-05: 250 mL via INTRAVENOUS

## 2014-02-06 ENCOUNTER — Telehealth: Payer: Self-pay

## 2014-02-06 NOTE — Telephone Encounter (Signed)
Spoke w/ pt's wife.  She reports that pt has been c/o pain in his neck and jaw, on & off, approx 4 times in the past couple of weeks.  Denies chest pain, SOB, nausea, or sweating. Pt has h/o previous MI.  Pt sched to see Dr. Mariah MillingGollan on 02/20/14, but would like a sooner appt.  Pt sched to see Dr. Mariah MillingGollan tomorrow at 11:15.  Advised wife to call 911 if chest pain is present during these episodes.  She is agreeable and will call w/ further questions or concerns.

## 2014-02-07 ENCOUNTER — Encounter: Payer: Self-pay | Admitting: Cardiovascular Disease

## 2014-02-07 ENCOUNTER — Ambulatory Visit (INDEPENDENT_AMBULATORY_CARE_PROVIDER_SITE_OTHER): Payer: Medicare Other | Admitting: Cardiovascular Disease

## 2014-02-07 VITALS — BP 88/62 | HR 64 | Ht 68.0 in | Wt 176.8 lb

## 2014-02-07 DIAGNOSIS — I951 Orthostatic hypotension: Secondary | ICD-10-CM

## 2014-02-07 DIAGNOSIS — I209 Angina pectoris, unspecified: Secondary | ICD-10-CM

## 2014-02-07 DIAGNOSIS — I2510 Atherosclerotic heart disease of native coronary artery without angina pectoris: Secondary | ICD-10-CM

## 2014-02-07 DIAGNOSIS — E1165 Type 2 diabetes mellitus with hyperglycemia: Secondary | ICD-10-CM

## 2014-02-07 DIAGNOSIS — R6884 Jaw pain: Secondary | ICD-10-CM

## 2014-02-07 DIAGNOSIS — E785 Hyperlipidemia, unspecified: Secondary | ICD-10-CM

## 2014-02-07 DIAGNOSIS — I25119 Atherosclerotic heart disease of native coronary artery with unspecified angina pectoris: Secondary | ICD-10-CM

## 2014-02-07 DIAGNOSIS — E1129 Type 2 diabetes mellitus with other diabetic kidney complication: Secondary | ICD-10-CM

## 2014-02-07 DIAGNOSIS — IMO0002 Reserved for concepts with insufficient information to code with codable children: Secondary | ICD-10-CM

## 2014-02-07 DIAGNOSIS — N186 End stage renal disease: Secondary | ICD-10-CM

## 2014-02-07 DIAGNOSIS — M542 Cervicalgia: Secondary | ICD-10-CM

## 2014-02-07 NOTE — Assessment & Plan Note (Signed)
We have suggested he talk about the drops in his pressure during and following dialysis with his kidney doctor. Uncertain if dry weight is low or if he needs midodrine for support. Recommended he hold amlodipine and carvedilol night before and morning of his dialysis

## 2014-02-07 NOTE — Assessment & Plan Note (Signed)
Cholesterol is at goal on the current lipid regimen. No changes to the medications were made.  

## 2014-02-07 NOTE — Assessment & Plan Note (Signed)
Recent episodes of jaw pain and neck pain concerning for angina. Known CAD. No recent stress test for the past 3 years. Sedentary at baseline, unable to treadmill. Stress test has been ordered

## 2014-02-07 NOTE — Assessment & Plan Note (Signed)
We have encouraged continued  careful diet management   

## 2014-02-07 NOTE — Progress Notes (Signed)
Patient ID: Daniel Anthony, male    DOB: 1950/08/28, 63 y.o.   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 Stress test was done early 2012 that showed no significant ischemia.  He reports that overall he has been doing well.  He does report not feeling well over the past several days, very low blood pressures at times, typically during and following dialysis. Holding his carvedilol and amlodipine at times for low blood pressure Also with 3 episodes of jaw and throat discomfort coming on at rest. He did not check his blood pressure during these episodes. He's not very active, in general does not do any significant activity. He uses a walker to get around Currently doing home HD three times a week. His wife runs the HD for him.   Wearing a brace for footdrop  Severe leg weakness, neuropathy He is on IV Ig every 28 days  Echocardiogram February 2012 was essentially normal. There was no mention on whether there was diastolic dysfunction. Study was read by the outside cardiologist  He reports he takes Crestor 40 mg 3 days per week  previous  lab work showing total cholesterol 135, LDL 60, HDL 43  EKG shows normal sinus rhythm with rate 64 beats per minute with no significant ST or T wave changes  Outpatient Encounter Prescriptions as of 02/07/2014  Medication Sig  . amLODipine (NORVASC) 10 MG tablet Take 5 mg by mouth daily.   Marland Kitchen aspirin 325 MG tablet Take 325  mg by mouth daily.  . calcitRIOL (ROCALTROL) 0.25 MCG capsule Take 50 mcg by mouth daily.   . Calcium Acetate, Phos Binder, (PHOSLO PO) 2 (two) times daily.  . carvedilol (COREG) 25 MG tablet Take 25 mg by mouth 2 (two) times daily with a meal.   . docusate sodium (COLACE) 250 MG capsule Take 1 capsule (250 mg total) by mouth 2 (two) times daily.  Marland Kitchen escitalopram (LEXAPRO) 20 MG tablet Take 20 mg by mouth daily.  . fentaNYL (DURAGESIC - DOSED MCG/HR) 100 MCG/HR Place 1 patch onto the skin every 3 (three) days.  . insulin detemir (LEVEMIR) 100 UNIT/ML injection Inject 45 Units into the skin at bedtime.   . lidocaine-prilocaine (EMLA) cream Apply 2.5 application topically as needed.  . multivitamin (RENA-VIT) TABS tablet Take 1 tablet by mouth daily.  . polyethylene glycol (MIRALAX / GLYCOLAX) packet Take 17 g by mouth 3 (three) times a week.  . rosuvastatin (CRESTOR) 40 MG tablet Take 1 tablet (40 mg total) by mouth 3 (three) times a week.  . tamsulosin (FLOMAX) 0.4 MG CAPS Take 0.4 mg by mouth daily.     Review of Systems  Constitutional: Negative.   HENT: Negative.   Eyes: Negative.   Respiratory: Negative.   Cardiovascular: Negative.   Gastrointestinal: Negative.   Endocrine: Negative.   Musculoskeletal: Negative.        Profound lower extremity muscle weakness, muscle atrophy in his hands  Skin: Negative.   Allergic/Immunologic: Negative.   Neurological: Negative.  Hematological: Negative.   Psychiatric/Behavioral: Negative.   All other systems reviewed and are negative.   BP 88/62  Pulse 64  Ht 5\' 8"  (1.727 m)  Wt 176 lb 12 oz (80.173 kg)  BMI 26.88 kg/m2  Physical Exam  Nursing note and vitals reviewed. Constitutional: He is oriented to person, place, and time. He appears well-developed and well-nourished.   muscle wasting  in his hands, thin legs  HENT:  Head: Normocephalic.  Nose: Nose normal.  Mouth/Throat: Oropharynx is clear and moist.  Eyes: Conjunctivae are  normal. Pupils are equal, round, and reactive to light.  Neck: Normal range of motion. Neck supple. No JVD present.  Cardiovascular: Normal rate, regular rhythm, S1 normal, S2 normal, normal heart sounds and intact distal pulses.  Exam reveals no gallop and no friction rub.   No murmur heard. Pulmonary/Chest: Effort normal and breath sounds normal. No respiratory distress. He has no wheezes. He has no rales. He exhibits no tenderness.  Abdominal: Soft. Bowel sounds are normal. He exhibits no distension. There is no tenderness.  Musculoskeletal: Normal range of motion. He exhibits no edema and no tenderness.  Lymphadenopathy:    He has no cervical adenopathy.  Neurological: He is alert and oriented to person, place, and time. Coordination normal.  Skin: Skin is warm and dry. No rash noted. No erythema.  Psychiatric: He has a normal mood and affect. His behavior is normal. Judgment and thought content normal.      Assessment and Plan

## 2014-02-07 NOTE — Assessment & Plan Note (Signed)
Home hemodialysis, run by his wife

## 2014-02-07 NOTE — Patient Instructions (Addendum)
You are doing well. No medication changes were made.  We will schedule a stress test for jaw and neck pain, known CAD  Ask Renal doctor about midodrine for blood pressure support  Please call us if you have new issues that need to be addressed before your next appt.  Your physician wants you to follow-up in: 6 months.  You will receive a reminder letter in the mail two months in advance. If you don't receive a letter, please call our office to schedule the follow-up appointment.   Your physician has requested that you have a lexiscan myoview. For further information please visit [email or web address]. Please follow instruction sheet, as given. [facility] MYOVIEW  Your caregiver has ordered a Stress Test with nuclear imaging. The purpose of this test is to evaluate the blood supply to your heart muscle. This procedure is referred to as a "Non-Invasive Stress Test." This is because other than having an IV started in your vein, nothing is inserted or "invades" your body. Cardiac stress tests are done to find areas of poor blood flow to the heart by determining the extent of coronary artery disease (CAD). Some patients exercise on a treadmill, which naturally increases the blood flow to your heart, while others who are  unable to walk on a treadmill due to physical limitations have a pharmacologic/chemical stress agent called Lexiscan . This medicine will mimic walking on a treadmill by temporarily increasing your coronary blood flow.   Please note: these test may take anywhere between 2-4 hours to complete  PLEASE REPORT TO [facility]  THE VOLUNTEERS AT THE FIRST DESK WILL DIRECT YOU WHERE TO GO  Date of Procedure:___July 2, 2015 _______________________  Arrival Time for Procedure:_7:45 am._____________________  Instructions regarding medication:   __x__ : Hold diabetes medication morning of procedure. Please bring insulin with you to your test.    __x__:  Hold other medications  as follows:__Please do not take the coreg and amlodipine the night before and the am of your [facility].   PLEASE NOTIFY THE OFFICE AT LEAST 24 HOURS IN ADVANCE IF YOU ARE UNABLE TO KEEP YOUR APPOINTMENT.  [phone] AND  PLEASE NOTIFY NUCLEAR MEDICINE AT [facility] AT LEAST 24 HOURS IN ADVANCE IF YOU ARE UNABLE TO KEEP YOUR APPOINTMENT. [phone]  How to prepare for your Myoview test:  1. Do not eat or drink after midnight 2. No caffeine for 24 hours prior to test 3. No smoking 24 hours prior to test. 4. Your medication may be taken with water.  If your doctor stopped a medication because of this test, do not take that medication. 5. Ladies, please do not wear dresses.  Skirts or pants are appropriate. Please wear a short sleeve shirt. 6. No perfume, cologne or lotion. 7. Wear comfortable walking shoes. No heels!

## 2014-02-11 ENCOUNTER — Telehealth: Payer: Self-pay

## 2014-02-11 NOTE — Telephone Encounter (Signed)
Spoke w/ pt.  He was advised to hold his carvedilol and amlodipine before his myoview on 7/2, but he concerned about holding both meds. He reports that that last time he held his meds, his BP shot up to over 200 systolic and he does not want this to happen again.  He would like to know if Dr. Mariah MillingGollan would be okay w/ him only holding his carvedilol before the test.  Please advise.  Thank you.

## 2014-02-13 NOTE — Telephone Encounter (Signed)
Spoke w/ pt.  Advised him that after discussing w/ Dr. Mariah MillingGollan, his recommendation is to not take his BP meds before lexi, but if pt feels that he must, to take carvedilol tonight, but not to take amlodipine.  Pt verbalizes understanding and will call w/ further questions or concerns.

## 2014-02-13 NOTE — Telephone Encounter (Signed)
Pt is calling back to see if his question from his previous call has been answered. Please call

## 2014-02-14 ENCOUNTER — Ambulatory Visit: Payer: Self-pay | Admitting: Cardiovascular Disease

## 2014-02-14 DIAGNOSIS — R079 Chest pain, unspecified: Secondary | ICD-10-CM

## 2014-02-18 ENCOUNTER — Telehealth: Payer: Self-pay

## 2014-02-18 ENCOUNTER — Other Ambulatory Visit: Payer: Self-pay

## 2014-02-18 DIAGNOSIS — I25119 Atherosclerotic heart disease of native coronary artery with unspecified angina pectoris: Secondary | ICD-10-CM

## 2014-02-18 DIAGNOSIS — R6884 Jaw pain: Secondary | ICD-10-CM

## 2014-02-18 DIAGNOSIS — M542 Cervicalgia: Secondary | ICD-10-CM

## 2014-02-18 NOTE — Telephone Encounter (Signed)
Pt would like stress test results. Please call. 

## 2014-02-18 NOTE — Telephone Encounter (Signed)
Reviewed preliminary results w/ pt.   

## 2014-02-20 ENCOUNTER — Ambulatory Visit: Payer: Medicare Other | Admitting: Cardiovascular Disease

## 2014-02-27 ENCOUNTER — Encounter: Payer: Self-pay | Admitting: Neurology

## 2014-02-27 ENCOUNTER — Ambulatory Visit (INDEPENDENT_AMBULATORY_CARE_PROVIDER_SITE_OTHER): Payer: Medicare Other | Admitting: Neurology

## 2014-02-27 VITALS — BP 112/60 | HR 60 | Wt 175.0 lb

## 2014-02-27 DIAGNOSIS — M216X9 Other acquired deformities of unspecified foot: Secondary | ICD-10-CM

## 2014-02-27 DIAGNOSIS — M21371 Foot drop, right foot: Secondary | ICD-10-CM

## 2014-02-27 DIAGNOSIS — E1142 Type 2 diabetes mellitus with diabetic polyneuropathy: Secondary | ICD-10-CM

## 2014-02-27 DIAGNOSIS — R269 Unspecified abnormalities of gait and mobility: Secondary | ICD-10-CM

## 2014-02-27 DIAGNOSIS — M21372 Foot drop, left foot: Secondary | ICD-10-CM

## 2014-02-27 NOTE — Progress Notes (Signed)
Reason for visit: Peripheral neuropathy  Daniel Anthony is an 63 y.o. male  History of present illness:  Daniel Anthony is a 63 year old right-handed white male with a history of diabetes associated with a severe peripheral neuropathy with bilateral foot drops, gait disorder, and significant intrinsic muscle weakness of the hands. The patient has developed some weakness of the triceps muscles bilaterally, and he seems to be improving with IVIG therapy. The patient is having a lot of issues recently with orthostatic hypotension that is worse today after hemodialysis. He has had some adjustments in blood pressure medication and dry weight associated with the hemodialysis to try to help this. He sleeps in a recliner so his head is elevated at all times. He indicates that his ambulation and upright posture is limited to about 5-10 minutes. The patient uses a cane for ambulation, he reports no recent falls. He has a lot of pain issues with the legs below the knees associated with the neuropathy, and he is on a fentanyl patch which makes him drowsy. The patient is considering going on Lyrica for this issue. In the past, Cymbalta resulted in nausea. Gabapentin was not well-tolerated either.  Past Medical History  Diagnosis Date  . Bradycardia     Hx of  . Pleural effusion, right   . Hypomagnesemia   . Hypertension   . Diabetes mellitus   . Hyperlipidemia   . Coronary artery disease   . 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 MI (myocardial infarction) 1995    anterior  . CIDP (chronic inflammatory demyelinating polyneuropathy)   . Polyneuropathy in diabetes(357.2) 12/13/2012  . Abnormality of gait   . Foot drop, bilateral 05/10/2013  . Kidney disease   . End-stage renal disease   . Dialysis patient     Past Surgical History  Procedure Laterality Date  . Tonsillectomy    . Cardiac catheterization  1995    2 stents   . Coronary angioplasty with stent placement   1995  . Lithotripsy      Family History  Problem Relation Age of Onset  . Heart attack Father   . Hypertension Father   . Diabetes Father   . Cancer Father     prostate  . Hypertension Mother   . Cancer Maternal Grandmother     colon    Social history:  reports that he quit smoking about 34 years ago. His smoking use included Cigarettes. He smoked 0.00 packs per day. He has never used smokeless tobacco. He reports that he does not drink alcohol or use illicit drugs.    Allergies  Allergen Reactions  . Zosyn [Piperacillin Sod-Tazobactam So] Itching and Rash    Medications:  Current Outpatient Prescriptions on File Prior to Visit  Medication Sig Dispense Refill  . amLODipine (NORVASC) 10 MG tablet Take 5 mg by mouth daily.       Marland Kitchen aspirin 325 MG tablet Take 325 mg by mouth daily.      . calcitRIOL (ROCALTROL) 0.25 MCG capsule Take 75 mcg by mouth daily.       . carvedilol (COREG) 25 MG tablet Take 25 mg by mouth 2 (two) times daily with a meal.       . docusate sodium (COLACE) 250 MG capsule Take 1 capsule (250 mg total) by mouth 2 (two) times daily.  60 capsule  1  . escitalopram (LEXAPRO) 20 MG tablet Take 20 mg by mouth daily.      Marland Kitchen  fentaNYL (DURAGESIC - DOSED MCG/HR) 100 MCG/HR Place 1 patch onto the skin every 3 (three) days.      . insulin detemir (LEVEMIR) 100 UNIT/ML injection Inject 45 Units into the skin at bedtime.       . lidocaine-prilocaine (EMLA) cream Apply 2.5 application topically as needed.      . multivitamin (RENA-VIT) TABS tablet Take 1 tablet by mouth daily.      . polyethylene glycol (MIRALAX / GLYCOLAX) packet Take 17 g by mouth 3 (three) times a week.      . rosuvastatin (CRESTOR) 40 MG tablet Take 1 tablet (40 mg total) by mouth 3 (three) times a week.  30 tablet  1  . tamsulosin (FLOMAX) 0.4 MG CAPS Take 0.4 mg by mouth daily.        No current facility-administered medications on file prior to visit.    ROS:  Out of a complete 14 system review  of symptoms, the patient complains only of the following symptoms, and all other reviewed systems are negative.  Fatigue Back pain, achy muscles, walking difficulty, neck pain Weakness, numbness  Blood pressure 112/60, pulse 60, weight 175 lb (79.379 kg).  Physical Exam  General: The patient is alert and cooperative at the time of the examination.  Skin: No significant peripheral edema is noted.   Neurologic Exam  Mental status: The patient is oriented x 3.  Cranial nerves: Facial symmetry is present. Speech is normal, no aphasia or dysarthria is noted. Extraocular movements are full. Visual fields are full.  Motor: The patient has good strength in all 4 extremities, with the exception that there is 4/5 strength of the triceps muscles bilaterally, and weakness with external rotation of the right arm and intrinsic muscle weakness of the hands bilaterally. The patient has bilateral foot drops, and he wears AFO braces bilaterally.  Sensory examination: There is a stocking pattern pinprick sensory deficit just below the knees bilaterally. No stocking pattern deficit is noted in the arms.  Coordination: The patient has good finger-nose-finger and heel-to-shin bilaterally.  Gait and station: The patient has a wide-based, slightly unsteady gait. The patient uses a cane for ambulation. Tandem gait was not attempted. Romberg is unsteady, the patient does not fall.  Reflexes: Deep tendon reflexes are symmetric, but are depressed to absent throughout.   Assessment/Plan:  1. Diabetic peripheral neuropathy  2. Gait disorder  3. Orthostatic hypotension  4. End-stage renal disease, on hemodialysis  The strength of the triceps muscles has clearly improved over time bilaterally. The patient will continue the IVIG for least another 8 months. The patient will followup in about 6 months. If the significant neuropathy pain continues, the possibility of a spinal stimulator could be considered.    Marlan Palau. Daniel Aaliya Maultsby MD 02/27/2014 8:10 PM  [facility] [address] [place], [place] [id number]  Phone [phone] Fax [phone]

## 2014-02-27 NOTE — Patient Instructions (Signed)

## 2014-03-05 ENCOUNTER — Encounter (HOSPITAL_COMMUNITY)
Admission: RE | Admit: 2014-03-05 | Discharge: 2014-03-05 | Disposition: A | Discharge status: Home / Self Care | Payer: Medicare Other | Source: Ambulatory Visit | Attending: Nephrology | Admitting: Nephrology

## 2014-03-05 ENCOUNTER — Telehealth: Payer: Self-pay | Admitting: Neurology

## 2014-03-05 ENCOUNTER — Other Ambulatory Visit: Payer: Self-pay | Admitting: Neurology

## 2014-03-05 VITALS — BP 186/78 | HR 72 | Temp 98.9°F | Resp 18 | Ht 68.0 in | Wt 178.0 lb

## 2014-03-05 DIAGNOSIS — G6181 Chronic inflammatory demyelinating polyneuritis: Secondary | ICD-10-CM | POA: Diagnosis present

## 2014-03-05 MED ORDER — DIPHENHYDRAMINE HCL 25 MG PO CAPS: 50.0000 mg | ORAL_CAPSULE | ORAL | Status: DC

## 2014-03-05 MED ORDER — IMMUNE GLOBULIN (HUMAN) 10 GM/100ML IV SOLN
400.0000 mg/kg | INTRAVENOUS | Status: DC
  Administered 2014-03-05: 30 g via INTRAVENOUS
  Filled 2014-03-05: qty 300

## 2014-03-05 MED ORDER — ACETAMINOPHEN 325 MG PO TABS: 650.0000 mg | ORAL_TABLET | ORAL | Status: DC

## 2014-03-05 NOTE — Telephone Encounter (Signed)
[facility]  Please put order in EPIC.  Thanks

## 2014-03-05 NOTE — Telephone Encounter (Signed)
I'll put orders in for IVIG. The patient gets Privigen 10% 400 mg per kilogram once a month.

## 2014-03-15 ENCOUNTER — Encounter: Payer: Self-pay | Admitting: Neurology

## 2014-04-09 ENCOUNTER — Encounter (HOSPITAL_COMMUNITY)
Admission: RE | Admit: 2014-04-09 | Discharge: 2014-04-09 | Disposition: A | Discharge status: Home / Self Care | Payer: Medicare Other | Source: Ambulatory Visit | Attending: Nephrology | Admitting: Nephrology

## 2014-04-09 VITALS — BP 180/75 | HR 65 | Temp 98.5°F | Resp 20 | Ht 68.0 in | Wt 176.4 lb

## 2014-04-09 DIAGNOSIS — G6181 Chronic inflammatory demyelinating polyneuritis: Secondary | ICD-10-CM | POA: Insufficient documentation

## 2014-04-09 MED ORDER — DIPHENHYDRAMINE HCL 25 MG PO CAPS
50.0000 mg | ORAL_CAPSULE | ORAL | Status: DC
Start: 2014-04-09 — End: 2014-04-10

## 2014-04-09 MED ORDER — IMMUNE GLOBULIN (HUMAN) 10 GM/100ML IV SOLN
400.0000 mg/kg | INTRAVENOUS | Status: DC
  Administered 2014-04-09: 30 g via INTRAVENOUS
  Filled 2014-04-09 (×2): qty 300

## 2014-04-09 MED ORDER — ACETAMINOPHEN 325 MG PO TABS: 650.0000 mg | ORAL_TABLET | ORAL | Status: DC

## 2014-04-09 NOTE — Progress Notes (Signed)
Patient tolerated IVIG well. Had one episode of vomiting liquid green emesis in trash can. Reports he took Zofran right before coming in. Relief stated after vomiting.

## 2014-05-07 ENCOUNTER — Encounter (HOSPITAL_COMMUNITY)
Admission: RE | Admit: 2014-05-07 | Discharge: 2014-05-07 | Disposition: A | Discharge status: Home / Self Care | Payer: Medicare Other | Source: Ambulatory Visit | Attending: Nephrology | Admitting: Nephrology

## 2014-05-07 VITALS — BP 156/62 | HR 63 | Temp 98.4°F | Resp 20 | Ht 68.0 in | Wt 177.0 lb

## 2014-05-07 DIAGNOSIS — G6181 Chronic inflammatory demyelinating polyneuritis: Secondary | ICD-10-CM | POA: Diagnosis not present

## 2014-05-07 MED ORDER — ACETAMINOPHEN 325 MG PO TABS: 650.0000 mg | ORAL_TABLET | ORAL | Status: DC

## 2014-05-07 MED ORDER — IMMUNE GLOBULIN (HUMAN) 10 GM/100ML IV SOLN
400.0000 mg/kg | INTRAVENOUS | Status: DC
  Administered 2014-05-07: 30 g via INTRAVENOUS
  Filled 2014-05-07 (×2): qty 300

## 2014-05-07 MED ORDER — DIPHENHYDRAMINE HCL 25 MG PO CAPS: 50.0000 mg | ORAL_CAPSULE | ORAL | Status: DC

## 2014-06-03 ENCOUNTER — Telehealth: Payer: Self-pay | Admitting: Neurology

## 2014-06-03 ENCOUNTER — Other Ambulatory Visit: Payer: Self-pay | Admitting: Neurology

## 2014-06-03 DIAGNOSIS — G6181 Chronic inflammatory demyelinating polyneuritis: Secondary | ICD-10-CM

## 2014-06-03 NOTE — Telephone Encounter (Signed)
Please place IVIG orders in Epic, patient's appointment tomorrow 06-04-14.

## 2014-06-03 NOTE — Telephone Encounter (Signed)
Laverne from Short Stay calling to state that patient's IVIG orders have expired and needs to be put back into Epic, states he's coming in for an appointment tomorrow. Please call and advise.

## 2014-06-03 NOTE — Telephone Encounter (Signed)
I'll write the orders for the Privigen, 10%, once a month.

## 2014-06-04 ENCOUNTER — Other Ambulatory Visit: Payer: Self-pay | Admitting: Neurology

## 2014-06-04 ENCOUNTER — Encounter (HOSPITAL_COMMUNITY)
Admission: RE | Admit: 2014-06-04 | Discharge: 2014-06-04 | Disposition: A | Discharge status: Home / Self Care | Payer: Medicare Other | Source: Ambulatory Visit | Attending: Nephrology | Admitting: Nephrology

## 2014-06-04 ENCOUNTER — Telehealth: Payer: Self-pay | Admitting: Neurology

## 2014-06-04 VITALS — BP 157/61 | HR 63 | Temp 98.0°F | Resp 20 | Ht 68.0 in | Wt 180.0 lb

## 2014-06-04 DIAGNOSIS — G6181 Chronic inflammatory demyelinating polyneuritis: Secondary | ICD-10-CM

## 2014-06-04 MED ORDER — ONDANSETRON HCL 4 MG/2ML IJ SOLN
INTRAMUSCULAR | Status: AC
  Filled 2014-06-04: qty 2

## 2014-06-04 MED ORDER — IMMUNE GLOBULIN (HUMAN) 10 GM/100ML IV SOLN
400.0000 mg/kg | INTRAVENOUS | Status: DC
  Administered 2014-06-04: 35 g via INTRAVENOUS
  Filled 2014-06-04: qty 350

## 2014-06-04 MED ORDER — IMMUNE GLOBULIN (HUMAN) 10 GM/100ML IV SOLN
1.0000 g/kg | INTRAVENOUS | Status: DC
  Filled 2014-06-04: qty 800

## 2014-06-04 MED ORDER — ONDANSETRON HCL 4 MG/2ML IJ SOLN
4.0000 mg | Freq: Once | INTRAMUSCULAR | Status: DC
  Administered 2014-06-04: 4 mg via INTRAVENOUS

## 2014-06-04 MED ORDER — DEXTROSE 5 % IV SOLN
INTRAVENOUS | Status: DC
  Administered 2014-06-04: 14:00:00 via INTRAVENOUS

## 2014-06-04 MED ORDER — ONDANSETRON HCL 4 MG/2ML IJ SOLN: 4.0000 mg | Freq: Once | INTRAMUSCULAR | Status: DC

## 2014-06-04 MED ORDER — ACETAMINOPHEN 325 MG PO TABS
650.0000 mg | ORAL_TABLET | ORAL | Status: DC
Start: 2014-06-04 — End: 2014-06-05

## 2014-06-04 MED ORDER — DIPHENHYDRAMINE HCL 25 MG PO CAPS: 50.0000 mg | ORAL_CAPSULE | ORAL | Status: DC

## 2014-06-04 NOTE — Telephone Encounter (Signed)
Sp[facility] to [facility] at [facility] and he already spoke to Dr. Anne HahnWillis to get order clarified.

## 2014-06-04 NOTE — Telephone Encounter (Signed)
[facility] that [employer] [facility] for [facility] @ [phone], questioning IVIG order.  Please call asap and advise.

## 2014-07-05 ENCOUNTER — Encounter (HOSPITAL_COMMUNITY)
Admission: RE | Admit: 2014-07-05 | Discharge: 2014-07-05 | Disposition: A | Discharge status: Home / Self Care | Payer: Medicare Other | Source: Ambulatory Visit | Attending: Nephrology | Admitting: Nephrology

## 2014-07-05 VITALS — BP 167/64 | HR 64 | Temp 97.8°F | Resp 20 | Ht 68.0 in | Wt 176.4 lb

## 2014-07-05 DIAGNOSIS — G6181 Chronic inflammatory demyelinating polyneuritis: Secondary | ICD-10-CM | POA: Diagnosis not present

## 2014-07-05 MED ORDER — IMMUNE GLOBULIN (HUMAN) 10 GM/100ML IV SOLN
400.0000 mg/kg | INTRAVENOUS | Status: DC
  Administered 2014-07-05: 35 g via INTRAVENOUS
  Filled 2014-07-05: qty 350

## 2014-07-05 MED ORDER — DIPHENHYDRAMINE HCL 25 MG PO CAPS: 50.0000 mg | ORAL_CAPSULE | ORAL | Status: DC

## 2014-07-05 MED ORDER — ACETAMINOPHEN 325 MG PO TABS: 650.0000 mg | ORAL_TABLET | ORAL | Status: DC

## 2014-07-05 MED ORDER — DEXTROSE 5 % IV SOLN
INTRAVENOUS | Status: DC
  Administered 2014-07-05: 500 mL via INTRAVENOUS

## 2014-08-05 ENCOUNTER — Telehealth: Payer: Self-pay | Admitting: Neurology

## 2014-08-05 ENCOUNTER — Other Ambulatory Visit: Payer: Self-pay | Admitting: Neurology

## 2014-08-05 ENCOUNTER — Encounter (HOSPITAL_COMMUNITY)
Admission: RE | Admit: 2014-08-05 | Discharge: 2014-08-05 | Disposition: A | Discharge status: Home / Self Care | Payer: Medicare Other | Source: Ambulatory Visit | Attending: Nephrology | Admitting: Nephrology

## 2014-08-05 VITALS — BP 150/54 | HR 70 | Temp 97.9°F | Resp 20 | Wt 176.4 lb

## 2014-08-05 DIAGNOSIS — G629 Polyneuropathy, unspecified: Secondary | ICD-10-CM

## 2014-08-05 DIAGNOSIS — G6181 Chronic inflammatory demyelinating polyneuritis: Secondary | ICD-10-CM | POA: Insufficient documentation

## 2014-08-05 MED ORDER — DEXTROSE 5 % IV SOLN
INTRAVENOUS | Status: DC
  Administered 2014-08-05: 14:00:00 via INTRAVENOUS

## 2014-08-05 MED ORDER — IMMUNE GLOBULIN (HUMAN) 10 GM/100ML IV SOLN
400.0000 mg/kg | Freq: Once | INTRAVENOUS | Status: AC
  Administered 2014-08-05: 30 g via INTRAVENOUS
  Filled 2014-08-05: qty 300

## 2014-08-05 MED ORDER — IMMUNE GLOBULIN (HUMAN) 10 GM/100ML IV SOLN: 1.0000 g/kg | Freq: Once | INTRAVENOUS | Status: DC

## 2014-08-05 NOTE — Telephone Encounter (Signed)
WID:  Sp[facility] to Leverne at [facility], the patient needs a new order put in for his IVIG he gets Privigen.  Please see previous orders.

## 2014-08-05 NOTE — Telephone Encounter (Signed)
Lupita LeashDonna, I have put in the order, please check with short stay, make sure that it is done right.

## 2014-08-05 NOTE — Telephone Encounter (Signed)
Order has been clarified with the pharmacist and Dr. Terrace ArabiaYan.

## 2014-08-30 ENCOUNTER — Ambulatory Visit (INDEPENDENT_AMBULATORY_CARE_PROVIDER_SITE_OTHER): Payer: 59 | Admitting: Neurology

## 2014-08-30 ENCOUNTER — Encounter: Payer: Self-pay | Admitting: Neurology

## 2014-08-30 ENCOUNTER — Ambulatory Visit: Payer: Medicare Other | Admitting: Neurology

## 2014-08-30 VITALS — BP 126/70 | HR 59 | Ht 68.0 in | Wt 180.0 lb

## 2014-08-30 DIAGNOSIS — R269 Unspecified abnormalities of gait and mobility: Secondary | ICD-10-CM

## 2014-08-30 DIAGNOSIS — M21372 Foot drop, left foot: Secondary | ICD-10-CM

## 2014-08-30 DIAGNOSIS — M21371 Foot drop, right foot: Secondary | ICD-10-CM

## 2014-08-30 DIAGNOSIS — E1142 Type 2 diabetes mellitus with diabetic polyneuropathy: Secondary | ICD-10-CM

## 2014-08-30 NOTE — Progress Notes (Signed)
Reason for visit: Peripheral neuropathy  Daniel Anthony is an 64 y.o. 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 The patient has developed some weakness of the triceps muscles bilaterally. The patient believes that he has been relatively stable since last seen. He has not developed any new weakness of the extremities. He has continued on the IVIG, tolerating the medication well. He indicates that his balance has worsened, and now he requires a cane with ambulation at all times while in the leg house. He reports no falls. He still has a lot of discomfort associated with the neuropathy, but he is sleeping well at night. He returns to the office today for an evaluation.   Past Medical History  Diagnosis Date  . Bradycardia     Hx of  . Pleural effusion, right   . Hypomagnesemia   . Hypertension   . Diabetes mellitus   . Hyperlipidemia   . Coronary artery disease   . 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 MI (myocardial infarction) 1995    anterior  . CIDP (chronic inflammatory demyelinating polyneuropathy)   . Polyneuropathy in diabetes(357.2) 12/13/2012  . Abnormality of gait   . Foot drop, bilateral 05/10/2013  . Kidney disease   . End-stage renal disease   . Dialysis patient     Past Surgical History  Procedure Laterality Date  . Tonsillectomy    . Cardiac catheterization  1995    2 stents   . Coronary angioplasty with stent placement  1995  . Lithotripsy      Family History  Problem Relation Age of Onset  . Heart attack Father   . Hypertension Father   . Diabetes Father   . Cancer Father     prostate  . Hypertension Mother   . Cancer Maternal Grandmother     colon    Social history:  reports that he quit smoking about  35 years ago. His smoking use included Cigarettes. He has never used smokeless tobacco. He reports that he does not drink alcohol or use illicit drugs.    Allergies  Allergen Reactions  . Zosyn [Piperacillin Sod-Tazobactam So] Itching and Rash    Medications:  Current Outpatient Prescriptions on File Prior to Visit  Medication Sig Dispense Refill  . amLODipine (NORVASC) 10 MG tablet Take 5 mg by mouth daily.     Marland Kitchen. aspirin 325 MG tablet Take 325 mg by mouth daily.    . calcitRIOL (ROCALTROL) 0.25 MCG capsule Take 0.25 mcg by mouth daily.     . calcium acetate (PHOSLO) 667 MG capsule Take 667 mg by mouth 3 (three) times daily with meals.    . carvedilol (COREG) 25 MG tablet Take 25 mg by mouth 2 (two) times daily with a meal.     . docusate sodium (COLACE) 250 MG capsule Take 1 capsule (250 mg total) by mouth 2 (two) times daily. 60 capsule 1  . escitalopram (LEXAPRO) 20 MG tablet Take 20 mg by mouth daily.    . fentaNYL (DURAGESIC - DOSED MCG/HR) 100 MCG/HR Place 1 patch onto the skin every 3 (three) days.    . insulin detemir (LEVEMIR) 100 UNIT/ML injection Inject 45 Units into the skin at bedtime.     . lidocaine-prilocaine (EMLA)  cream Apply 2.5 application topically as needed.    . multivitamin (RENA-VIT) TABS tablet Take 1 tablet by mouth daily.    . polyethylene glycol (MIRALAX / GLYCOLAX) packet Take 17 g by mouth 3 (three) times a week.    . rosuvastatin (CRESTOR) 40 MG tablet Take 1 tablet (40 mg total) by mouth 3 (three) times a week. 30 tablet 1  . tamsulosin (FLOMAX) 0.4 MG CAPS Take 0.4 mg by mouth daily.      No current facility-administered medications on file prior to visit.    ROS:  Out of a complete 14 system review of symptoms, the patient complains only of the following symptoms, and all other reviewed systems are negative.  Cold intolerance, heat intolerance Back pain, neck pain Anemia Numbness, weakness, tremors  Blood pressure 126/70, pulse 59, height   (1.727 m), weight 180 lb (81.647 kg).  Physical Exam  General: The patient is alert and cooperative at the time of the examination.  Skin: No significant peripheral edema is noted.   Neurologic Exam  Mental status: The patient is oriented x 3.  Cranial nerves: Facial symmetry is present. Speech is normal, no aphasia or dysarthria is noted. Extraocular movements are full. Visual fields are full.  Motor: The patient has good strength in all 4 extremities, with the exception that there is 4/5 strength of the triceps muscles bilaterally, and weakness with external rotation of the right arm and intrinsic muscle weakness of the hands bilaterally. The patient has bilateral foot drops, and he wears AFO braces bilaterally.  Sensory examination: There is a stocking pattern pinprick sensory deficit just below the knees bilaterally. No stocking pattern deficit is noted in the arms.  Coordination: The patient has good finger-nose-finger and heel-to-shin bilaterally.  Gait and station: The patient has a wide-based, slightly unsteady gait. The patient uses a cane for ambulation. Tandem gait was not attempted. Romberg is unsteady, the patient does not fall.  Reflexes: Deep tendon reflexes are symmetric, but are depressed to absent throughout.   Assessment/Plan:  1. Diabetic peripheral neuropathy  2. Gait disorder  3. Orthostatic hypotension  4. End-stage renal disease, on hemodialysis  The strength of the triceps muscles has remained stable over time. The patient will discontinue the IVIG treatment. The patient will follow-up in about 6 months. He is to contact me if there are any changes that are noted over time. His examination today is quite stable compared to what it was 6 months ago.  Daniel Palau MD 08/30/2014 3:04 PM  [facility] [address] [place], [place] [address]  Phone [phone] Fax [phone]

## 2014-08-30 NOTE — Patient Instructions (Signed)

## 2014-09-03 ENCOUNTER — Encounter (HOSPITAL_COMMUNITY): Payer: Medicare Other

## 2014-10-14 ENCOUNTER — Ambulatory Visit (INDEPENDENT_AMBULATORY_CARE_PROVIDER_SITE_OTHER): Payer: Medicare Other | Admitting: Cardiovascular Disease

## 2014-10-14 ENCOUNTER — Encounter: Payer: Self-pay | Admitting: Cardiovascular Disease

## 2014-10-14 VITALS — BP 136/64 | HR 55 | Ht 68.0 in | Wt 180.0 lb

## 2014-10-14 DIAGNOSIS — I25119 Atherosclerotic heart disease of native coronary artery with unspecified angina pectoris: Secondary | ICD-10-CM

## 2014-10-14 DIAGNOSIS — M5410 Radiculopathy, site unspecified: Secondary | ICD-10-CM | POA: Insufficient documentation

## 2014-10-14 DIAGNOSIS — E1165 Type 2 diabetes mellitus with hyperglycemia: Secondary | ICD-10-CM

## 2014-10-14 DIAGNOSIS — G6181 Chronic inflammatory demyelinating polyneuritis: Secondary | ICD-10-CM

## 2014-10-14 DIAGNOSIS — IMO0002 Reserved for concepts with insufficient information to code with codable children: Secondary | ICD-10-CM

## 2014-10-14 DIAGNOSIS — E785 Hyperlipidemia, unspecified: Secondary | ICD-10-CM

## 2014-10-14 DIAGNOSIS — E1129 Type 2 diabetes mellitus with other diabetic kidney complication: Secondary | ICD-10-CM

## 2014-10-14 DIAGNOSIS — I159 Secondary hypertension, unspecified: Secondary | ICD-10-CM

## 2014-10-14 NOTE — Assessment & Plan Note (Signed)
Managed by Dr. Sullivan LoneGilbert. Hemoglobin A1c elevated

## 2014-10-14 NOTE — Assessment & Plan Note (Signed)
Currently with no symptoms of angina. No further workup at this time. Continue current medication regimen. 

## 2014-10-14 NOTE — Progress Notes (Signed)
Patient ID: BRAM HOTTEL, male    DOB: February 20, 1951, 64 y.o.   MRN: [id number]  HPI Comments: Mr. 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 Stress test was done early 2012 that showed no significant ischemia.  In follow-up he reports that he is doing well. Continues to do home dialysis. Wife takes care of everything for him including his dialysis. He does this 3 days per week Blood pressure has not been a major issue, not running too low or high He has been Holding his carvedilol and amlodipine at times for low blood pressure prior to dialysis  Previously had John throat discomfort. No recent symptoms  Legs are very weak, now using a wheelchair  Wearing a brace for footdrop  previously  on IV Ig every 28 days Previous hemoglobin A1c 11, down to 9.9. He reports it is in the 8 range  EKG on today's visit shows normal sinus rhythm with rate 55 bpm, no significant ST or T-wave changes  Echocardiogram February 2012 was essentially normal. There was no mention on whether there was diastolic dysfunction. Study was read by the outside cardiologist  He reports he takes Crestor 40 mg 3 days per week  previous  lab work showing total cholesterol 135, LDL 60, HDL 43   Allergies  Allergen Reactions  . Zosyn [Piperacillin Sod-Tazobactam So] Itching and Rash    Outpatient Encounter Prescriptions as of 64/29/2016  Medication Sig  . amLODipine (NORVASC) 10 MG tablet Take  5 mg by mouth daily.   Marland Kitchen aspirin 325 MG tablet Take 325 mg by mouth daily.  . calcitRIOL (ROCALTROL) 0.25 MCG capsule Take 0.25 mcg by mouth daily.   . calcium acetate (PHOSLO) 667 MG capsule Take 667 mg by mouth 3 (three) times daily with meals.  . carvedilol (COREG) 25 MG tablet Take 25 mg by mouth 2 (two) times daily with a meal.   . docusate sodium (COLACE) 250 MG capsule Take 1 capsule (250 mg total) by mouth 2 (two) times daily.  Marland Kitchen escitalopram (LEXAPRO) 20 MG tablet Take 20 mg by mouth daily.  . fentaNYL (DURAGESIC - DOSED MCG/HR) 100 MCG/HR Place 1 patch onto the skin every 3 (three) days.  . insulin detemir (LEVEMIR) 100 UNIT/ML injection Inject 42 Units into the skin at bedtime.   . lidocaine-prilocaine (EMLA) cream Apply 2.5 application topically as needed.  . multivitamin (RENA-VIT) TABS tablet Take 1 tablet by mouth daily.  Marland Kitchen NOVOLOG FLEXPEN 100 UNIT/ML FlexPen 100 Units. 12 units three times daily before meals  . polyethylene glycol (MIRALAX / GLYCOLAX) packet Take 17 g by mouth 3 (three) times a week.  . rosuvastatin (CRESTOR) 40 MG tablet Take 1 tablet (40 mg total) by mouth 3 (three) times a week. (Patient taking differently: Take 40 mg by mouth daily. )  . tamsulosin (FLOMAX) 0.4 MG CAPS Take 0.4 mg by mouth daily.     Past Medical History  Diagnosis Date  . Bradycardia     Hx of  . Pleural effusion,  right   . Hypomagnesemia   . Hypertension   . Diabetes mellitus   . Hyperlipidemia   . Coronary artery disease   . 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 MI (myocardial infarction) 1995    anterior  . CIDP (chronic inflammatory demyelinating polyneuropathy)   . Polyneuropathy in diabetes(357.2) 12/13/2012  . Abnormality of gait   . Foot drop, bilateral 05/10/2013  . Kidney disease   . End-stage renal disease   . Dialysis patient     Past Surgical History  Procedure Laterality Date  . Tonsillectomy    . Cardiac catheterization  1995     2 stents   . Coronary angioplasty with stent placement  1995  . Lithotripsy      Social History  reports that he quit smoking about 35 years ago. His smoking use included Cigarettes. He has never used smokeless tobacco. He reports that he does not drink alcohol or use illicit drugs.  Family History family history includes Cancer in his father and maternal grandmother; Diabetes in his father; Heart attack in his father; Hypertension in his father and mother.   Review of Systems  Constitutional: Negative.   Respiratory: Negative.   Cardiovascular: Negative.   Gastrointestinal: Negative.   Musculoskeletal: Negative.        Profound lower extremity muscle weakness, muscle atrophy in his hands  Skin: Negative.   Neurological: Negative.   Hematological: Negative.   Psychiatric/Behavioral: Negative.   All other systems reviewed and are negative.   BP 136/64 mmHg  Pulse 55  Ht 5\' 8"  (1.727 m)  Wt 180 lb (81.647 kg)  BMI 27.38 kg/m2  Physical Exam  Constitutional: He is oriented to person, place, and time. He appears well-developed and well-nourished.   muscle wasting  in his hands, thin legs  HENT:  Head: Normocephalic.  Nose: Nose normal.  Mouth/Throat: Oropharynx is clear and moist.  Eyes: Conjunctivae are normal. Pupils are equal, round, and reactive to light.  Neck: Normal range of motion. Neck supple. No JVD present.  Cardiovascular: Normal rate, regular rhythm, S1 normal, S2 normal, normal heart sounds and intact distal pulses.  Exam reveals no gallop and no friction rub.   No murmur heard. Pulmonary/Chest: Effort normal and breath sounds normal. No respiratory distress. He has no wheezes. He has no rales. He exhibits no tenderness.  Abdominal: Soft. Bowel sounds are normal. He exhibits no distension. There is no tenderness.  Musculoskeletal: Normal range of motion. He exhibits no edema or tenderness.  Lymphadenopathy:    He has no cervical adenopathy.  Neurological:  He is alert and oriented to person, place, and time. Coordination normal.  Skin: Skin is warm and dry. No rash noted. No erythema.  Psychiatric: He has a normal mood and affect. His behavior is normal. Judgment and thought content normal.      Assessment and Plan   Nursing note and vitals reviewed.

## 2014-10-14 NOTE — Assessment & Plan Note (Signed)
Progressive lower extremity weakness. Walks with support, often needing a wheelchair. High fall risk

## 2014-10-14 NOTE — Patient Instructions (Signed)
You are doing well. No medication changes were made.  Please call us if you have new issues that need to be addressed before your next appt.  Your physician wants you to follow-up in: 6 months.  You will receive a reminder letter in the mail two months in advance. If you don't receive a letter, please call our office to schedule the follow-up appointment.   

## 2014-10-14 NOTE — Assessment & Plan Note (Signed)
Cholesterol is at goal on the current lipid regimen. No changes to the medications were made.  

## 2014-10-14 NOTE — Assessment & Plan Note (Signed)
Blood pressure is well controlled on today's visit. No changes made to the medications. 

## 2014-12-06 NOTE — Op Note (Signed)
PATIENT NAME:  Daniel Anthony, Daniel Anthony MR#:  [id number] DATE OF BIRTH:  [birth year]  DATE OF PROCEDURE:  12/28/2012  PREOPERATIVE DIAGNOSES: 1.  Chronic kidney disease with progression to end-stage renal failure.  2.  Diabetes.  3.  Chronic inflammatory demyelinating polyneuropathy.  4.  Hypertension.   POSTOPERATIVE DIAGNOSES: 1.  Chronic kidney disease with progression to end-stage renal failure.  2.  Diabetes.  3.  Chronic inflammatory demyelinating polyneuropathy.  4.  Hypertension.  PROCEDURES PERFORMED: 1.  Ultrasound guidance for vascular access, right jugular vein.  2.  Fluoroscopic guidance for placement of catheter.  3.  Placement of a 19 cm tip-to-cuff tunneled hemodialysis catheter, right internal jugular vein.   SURGEON:  Annice NeedyJason S dew, Anthony.D.   ANESTHESIA:  Local with moderate conscious sedation.   BLOOD LOSS:  Approximately 25 mL.  FLUOROSCOPY TIME:  Less than one minute.  CONTRAST USED:  None.   INDICATION FOR PROCEDURE: A 64 year old white male who has known chronic kidney disease. He had a fistula placed 3 weeks ago, but has progressed to end-stage renal disease and he is to start dialysis now. His fistula is not yet ready for use. We will have to place a PermCath. Risks and benefits are discussed. Informed consent was obtained.   DESCRIPTION OF PROCEDURE: The patient is brought to the vascular interventional radiology suite. The right neck and chest were sterilely prepped and draped and a sterile surgical field was created. The right jugular vein was visualized on ultrasound and found to be widely patent. It was then accessed under direct ultrasound guidance without difficulty with a Seldinger needle. A J-wire was placed. After skin nick and dilatation and the peel-away sheath was placed over the wire anesthetized in an area 2 fingerbreadths below the right clavicle and tunneled from the subclavicular incision to the access site.  Using fluoroscopic guidance, I selected a 19 cm  tip-to-cuff tunneled hemodialysis catheter and tunneled this from the subclavicular incision to the access site; then placed it through the peel-away sheath and the peel-away sheath was removed. The catheter tip was parked just in the right atrium. The cuff was approximately one half the length between the access site and the exit site. The appropriate distal connectors were placed. It withdrew blood well and flushed easily with heparinized saline and then a concentrated heparin solution was then placed. The catheter was secured to the chest wall with 2 Prolene sutures. A 4-0 Monocryl pursestring suture was placed around the catheter exit site and a 4-0 Monocryl suture was used to close the access site. Dermabond was placed at the access site. The patient was then taken to the recovery room in stable condition having tolerated the procedure well.     ____________________________ Annice NeedyJason S. Dew, MD jsd:ce D: 12/28/2012 10:36:38 ET T: 12/28/2012 11:29:31 ET JOB#: [id number]  cc: Annice NeedyJason S. Dew, MD, <Dictator> Llana AlimentJames L. Deterding, MD Richard L. Sullivan LoneGilbert, MD  Annice NeedyJASON S DEW MD ELECTRONICALLY SIGNED 01/01/2013 13:55

## 2014-12-06 NOTE — Op Note (Signed)
PATIENT NAME:  Daniel FurnishJAMES, Daniel M MR#:  [id number] DATE OF BIRTH:  1951/05/30  DATE OF PROCEDURE:  01/18/2013  PREOPERATIVE DIAGNOSES:  1.  End-stage renal disease.  2.  Poorly maturing left arm arteriovenous fistula with perianastomotic stenosis.  3.  Chronic inflammatory demyelinating polyneuropathy.  4.  Hypertension.   POSTOPERATIVE DIAGNOSES:  1.  End-stage renal disease.  2.  Poorly maturing left arm arteriovenous fistula with perianastomotic stenosis.  3.  Chronic inflammatory demyelinating polyneuropathy.  4.  Hypertension.   PROCEDURE:  1.  Ultrasound guidance for vascular access to left radiocephalic arteriovenous fistula in a retrograde fashion in the cephalic vein.  2.  Left upper extremity fistulogram and central venogram.  3.  Percutaneous transluminal angioplasty of perianastomotic stenosis with 4 mm diameter angioplasty balloon.   SURGEON: Festus BarrenJason Anthony, M.D.   ANESTHESIA: Local with moderate conscious sedation.   ESTIMATED BLOOD LOSS: 25 mL.  CONTRAST USED: 35 mL.  INDICATION FOR PROCEDURE: A 64 year old white male with end-stage renal disease. He had a fistula placed a little over a month ago. This has a perianastomotic stenosis. He had a smaller diseased radial artery and a smaller vein than preferred. The fistula was patent and salvage of the fistula appears possible with intervention. The risks and benefits were discussed. Informed consent was obtained.   DESCRIPTION OF PROCEDURE: The patient was brought to the vascular and interventional radiology suite. Left upper extremity was sterilely prepped and draped and a sterile surgical field was created. The fistula was accessed just below the antecubital fossa and the cephalic vein in a retrograde fashion. Under direct ultrasound guidance, a permanent image was recorded. A micropuncture wire and sheath were placed and upsized to a 6-French sheath. Imaging showed a very tight perianastomotic stenosis. I was able to cross the  lesion with a Glidewire and exchanged for an 0.018 wire. Due to the small size of the vessels and the relatively new nature of the fistula, a 4 mm diameter angioplasty balloon was inflated. This had a very tight waist, which broke at about 14 atmospheres and this was held. Initially there was some mild extravasation and the 4 mm balloon was reinflated for 2 minutes. Following this, I did not see further extravasation and the fistula flow was significantly improved with no hemodynamically significant residual stenosis. At this point, I elected to terminate the procedure. The sheath was removed. I placed a 4-0 Monocryl pursestring suture. Pressure was held. Sterile dressing was placed. The patient tolerated the procedure well and was taken to the recovery room in stable condition.   ____________________________ Daniel NeedyJason S. Dew, MD jsd:aw D: 01/18/2013 11:53:21 ET T: 01/18/2013 12:02:44 ET JOB#: [id number]  cc: Daniel NeedyJason S. Dew, MD, <Dictator> Llana AlimentJames L. Deterding, MD Daniel NeedyJASON S DEW MD ELECTRONICALLY SIGNED 01/24/2013 10:58

## 2014-12-06 NOTE — Op Note (Signed)
PATIENT NAME:  Daniel Anthony, Daniel Anthony MR#:  [id number] DATE OF BIRTH:  January 22, 1951  DATE OF PROCEDURE:  03/29/2013  PREOPERATIVE DIAGNOSES:  1.  End-stage renal disease.  2.  Functional left arm arteriovenous fistula.  3.  Hypertension.  4.  Chronic inflammatory demyelinating polyneuropathy.  5.  Diabetes.   POSTOPERATIVE DIAGNOSES:  1.  End-stage renal disease.  2.  Functional left arm arteriovenous fistula.  3.  Hypertension.  4.  Chronic inflammatory demyelinating polyneuropathy.  5.  Diabetes.   PROCEDURE: Removal of right jugular PermCath.   SURGEON: Annice NeedyJason S. Dew, Anthony.D.   ANESTHESIA: Local with approximately 20 mL of 1% lidocaine.   ESTIMATED BLOOD LOSS: Minimal.   COMPLICATIONS: None.   INDICATION FOR PROCEDURE: A 64 year old white male with end-stage renal disease. He had a PermCath for permanent dialysis access, but has now been able to use his fistula successful even with home dialysis and can have his PermCath removed.   DESCRIPTION OF PROCEDURE: The patient is brought to the vascular interventional radiology area. The right neck and chest and existing catheter were sterilely prepped and draped and a sterile surgical field was created. The area was copiously anesthetized with 1% lidocaine. Hemostats were used to help dissect out the catheter and the cuff from the surrounding fibrous tissue. The catheter was then removed in its entirety with gentle traction with the cuff on the catheter. Pressure was held over the exit site and the jugular site and sterile dressing was placed. The patient tolerated the procedure well.   ____________________________ Annice NeedyJason S. Dew, MD jsd:aw D: 03/29/2013 10:16:47 ET T: 03/29/2013 11:02:33 ET JOB#: [id number]  cc: Annice NeedyJason S. Dew, MD, <Dictator> Dr. Fayrene FearingJames Deterding Annice NeedyJASON S DEW MD ELECTRONICALLY SIGNED 04/02/2013 16:08

## 2014-12-06 NOTE — Op Note (Signed)
PATIENT NAME:  Daniel FurnishJAMES, [name] MR#:  [id number] DATE OF BIRTH:  [birth year]  DATE OF PROCEDURE:  12/07/2012  PREOPERATIVE DIAGNOSES: 1.  Chronic kidney disease nearing dialysis dependence.  2.  Severe hypertension.  3.  Diabetes.   POSTOPERATIVE DIAGNOSES:  1.  Chronic kidney disease nearing dialysis dependence. 2.  Severe hypertension. 3.  Diabetes.  PROCEDURE:  Left radiocephalic arteriovenous fistula creation.   SURGEON: Annice NeedyJason S Dew, MD   ANESTHESIA: MAC.   ESTIMATED BLOOD LOSS: Minimal.   INDICATION FOR PROCEDURE: This is a 64 year old white male who has chronic kidney disease. He has had some deterioration of his renal function of the past year. He is not yet on dialysis, but it is expected over the next several months to a couple of years he will progress to dialysis, and permanent dialysis access is indicated to have this in place for his initiation of dialysis when the time comes. The risks and benefits were discussed. Noninvasive studies suggested that the left radiocephalic location was likely his best location for fistula creation initially.  He desired to proceed.   DESCRIPTION OF PROCEDURE: The patient is brought to the operative suite, and after an adequate level of intravenous sedation was obtained, the left upper extremity was sterilely prepped and draped and a sterile surgical field was created. An incision was created in the forearm between the palpable radial artery and the typical course of the cephalic vein.  I dissected out the cephalic vein, which was a small to medium-sized vein but did appear to be usable for fistula creation. This was dissected out.  Several branches were ligated and divided between silk ties, and I sequentially used the [place] dilators to dilate up to 4 mm in the vein, which was clearly an adequate size for fistula creation. A bulldog clamp was placed on it, and it was marked for orientation. The artery was then dissected.  The artery was heavily  calcified at the radial location.  Vessel loops were placed proximally and distally, and the patient was systemically heparinized. An anterior wall arteriotomy was created with an 11 blade and extended with Potts scissors after controlling the vessel with clamps as vessel loops would not control the calcified artery.  We checked for good pulsatile inflow and this was present. I then cut and beveled the vein to an appropriate length to match the arteriotomy. An anastomosis was created with a running 6-0 Prolene suture in the usual fashion. The vessel was flushed and de-aired prior to releasing control, and a single 6-0 Prolene patch suture was used for hemostasis.  Topical papaverine was used for some local vasospasm. Following this, there was a soft thrill within the AV fistula. I then irrigated the wound. Surgicel was placed and hemostasis was complete. The wound was then closed with a running 3-0 Vicryl and a 4-0 Monocryl. Dermabond was placed as a dressing. The patient tolerated the procedure well and was taken to the recovery room in stable condition.   ____________________________ Annice NeedyJason S. Dew, MD jsd:cb D: 12/07/2012 14:52:12 ET T: 12/07/2012 16:52:06 ET JOB#: [id number]  cc: Annice NeedyJason S. Dew, MD, <Dictator> Llana AlimentJames L. Deterding, MD Richard L. Sullivan LoneGilbert, MD Annice NeedyJASON S DEW MD ELECTRONICALLY SIGNED 12/11/2012 9:29

## 2015-01-17 ENCOUNTER — Other Ambulatory Visit: Payer: Self-pay | Admitting: Family Medicine

## 2015-01-17 DIAGNOSIS — G6181 Chronic inflammatory demyelinating polyneuritis: Secondary | ICD-10-CM

## 2015-01-17 MED ORDER — FENTANYL 100 MCG/HR TD PT72: 100.0000 ug | MEDICATED_PATCH | TRANSDERMAL | Status: DC

## 2015-01-18 ENCOUNTER — Other Ambulatory Visit: Payer: Self-pay | Admitting: Family Medicine

## 2015-01-21 ENCOUNTER — Other Ambulatory Visit: Payer: Self-pay | Admitting: Family Medicine

## 2015-01-21 ENCOUNTER — Other Ambulatory Visit: Payer: Self-pay

## 2015-01-21 DIAGNOSIS — I25119 Atherosclerotic heart disease of native coronary artery with unspecified angina pectoris: Secondary | ICD-10-CM

## 2015-01-21 DIAGNOSIS — G6181 Chronic inflammatory demyelinating polyneuritis: Secondary | ICD-10-CM

## 2015-01-21 MED ORDER — NITROGLYCERIN 0.4 MG SL SUBL: 0.4000 mg | SUBLINGUAL_TABLET | SUBLINGUAL | Status: DC | PRN

## 2015-01-21 MED ORDER — FENTANYL 100 MCG/HR TD PT72: 100.0000 ug | MEDICATED_PATCH | TRANSDERMAL | Status: DC

## 2015-02-10 ENCOUNTER — Other Ambulatory Visit: Payer: Self-pay

## 2015-02-19 ENCOUNTER — Other Ambulatory Visit: Payer: Self-pay | Admitting: Family Medicine

## 2015-02-25 ENCOUNTER — Other Ambulatory Visit: Payer: Self-pay

## 2015-02-25 DIAGNOSIS — R972 Elevated prostate specific antigen [PSA]: Secondary | ICD-10-CM

## 2015-03-03 ENCOUNTER — Other Ambulatory Visit: Payer: Self-pay | Admitting: Family Medicine

## 2015-03-03 ENCOUNTER — Encounter: Payer: Self-pay | Admitting: Neurology

## 2015-03-03 ENCOUNTER — Ambulatory Visit (INDEPENDENT_AMBULATORY_CARE_PROVIDER_SITE_OTHER): Payer: Medicare Other | Admitting: Neurology

## 2015-03-03 VITALS — BP 129/71 | HR 63 | Ht 68.0 in | Wt 182.0 lb

## 2015-03-03 DIAGNOSIS — M21371 Foot drop, right foot: Secondary | ICD-10-CM

## 2015-03-03 DIAGNOSIS — E1142 Type 2 diabetes mellitus with diabetic polyneuropathy: Secondary | ICD-10-CM

## 2015-03-03 DIAGNOSIS — M21372 Foot drop, left foot: Secondary | ICD-10-CM | POA: Diagnosis not present

## 2015-03-03 DIAGNOSIS — R269 Unspecified abnormalities of gait and mobility: Secondary | ICD-10-CM | POA: Diagnosis not present

## 2015-03-03 NOTE — Progress Notes (Signed)
Reason for visit: Peripheral neuropathy  Daniel Anthony is an 64 y.o. 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 The patient has severe changes associated with the neuropathy that include bilateral foot drops, severe sensory impairment of the legs. The patient has mild weakness with hip flexion bilaterally, and bilateral triceps weakness. The patient has stopped the IVIG, he has not noted any significant decline in his functional level. He is limited with his walking mainly secondary to low back pain. He will have onset of pain shortly after standing, and this will not allow him to remain standing for more than just a few moments. The patient walks with a cane or a walker inside the house, he uses a wheelchair outside the house. He denies any falls, he remains quite safe with walking. He has AFO braces, but he does not use them secondary to the minimal walking that he does. He is sleeping well at night, his neuropathy pain is not significant. As long as he is sitting or lying down, he does not have back pain.  Past Medical History  Diagnosis Date  . Bradycardia     Hx of  . Pleural effusion, right   . Hypomagnesemia   . Hypertension   . Diabetes mellitus   . Hyperlipidemia   . Coronary artery disease   . 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 MI (myocardial infarction) 1995    anterior  . CIDP (chronic inflammatory demyelinating polyneuropathy)   . Polyneuropathy in diabetes(357.2) 12/13/2012  . Abnormality of gait   . Foot drop, bilateral 05/10/2013  . Kidney disease   . End-stage renal disease   . Dialysis patient     Past Surgical History  Procedure Laterality Date  . Tonsillectomy    . Cardiac catheterization  1995    2 stents   . Coronary angioplasty with stent placement  1995  . Lithotripsy      Family History  Problem  Relation Age of Onset  . Heart attack Father   . Hypertension Father   . Diabetes Father   . Cancer Father     prostate  . Hypertension Mother   . Cancer Maternal Grandmother     colon    Social history:  reports that he quit smoking about 35 years ago. His smoking use included Cigarettes. He has never used smokeless tobacco. He reports that he does not drink alcohol or use illicit drugs.    Allergies  Allergen Reactions  . Zosyn [Piperacillin Sod-Tazobactam So] Itching and Rash    Medications:  Prior to Admission medications   Medication Sig Start Date End Date Taking? Authorizing Provider  amLODipine (NORVASC) 10 MG tablet Take 10 mg by mouth daily.  10/27/12  Yes Vassie Loll, MD  aspirin 325 MG tablet Take 325 mg by mouth daily.   Yes Historical Provider, MD  calcitRIOL (ROCALTROL) 0.25 MCG capsule Take 0.25 mcg by mouth daily.    Yes Historical Provider, MD  calcium acetate (PHOSLO) 667 MG capsule Take 667 mg by mouth 3 (three) times daily with meals.   Yes Historical Provider, MD  carvedilol (COREG) 25 MG tablet Take 25 mg by mouth 2 (two) times daily with a meal.    Yes Historical Provider, MD  cinacalcet (SENSIPAR) 30 MG tablet Take 30 mg by mouth daily.   Yes Historical Provider,  MD  docusate sodium (COLACE) 250 MG capsule Take 1 capsule (250 mg total) by mouth 2 (two) times daily. 10/27/12  Yes Vassie Lollarlos Madera, MD  escitalopram (LEXAPRO) 20 MG tablet TAKE 1 TABLET BY MOUTH EVERY DAY 02/19/15  Yes Maple Hudsonichard L Gilbert Jr., MD  fentaNYL (DURAGESIC - DOSED MCG/HR) 100 MCG/HR Place 1 patch (100 mcg total) onto the skin every 3 (three) days. 01/21/15  Yes Richard Hulen ShoutsL Gilbert Jr., MD  insulin detemir (LEVEMIR) 100 UNIT/ML injection Inject 42 Units into the skin at bedtime.    Yes Historical Provider, MD  LEVEMIR FLEXTOUCH 100 UNIT/ML Pen INJECT 45 UNITS UNDER THE SKIN EVERY NIGHT AT BEDTIME 01/20/15  Yes Maple Hudsonichard L Gilbert Jr., MD  lidocaine-prilocaine (EMLA) cream Apply 2.5 application  topically as needed. 08/21/13  Yes Historical Provider, MD  multivitamin (RENA-VIT) TABS tablet Take 1 tablet by mouth daily.   Yes Historical Provider, MD  nitroGLYCERIN (NITROSTAT) 0.4 MG SL tablet Place 1 tablet (0.4 mg total) under the tongue every 5 (five) minutes as needed for chest pain. 01/21/15  Yes Richard Hulen ShoutsL Gilbert Jr., MD  NOVOLOG FLEXPEN 100 UNIT/ML FlexPen 100 Units. 12 units three times daily before meals 06/03/14  Yes Historical Provider, MD  polyethylene glycol (MIRALAX / GLYCOLAX) packet Take 17 g by mouth 3 (three) times a week.   Yes Historical Provider, MD  rosuvastatin (CRESTOR) 40 MG tablet Take 1 tablet (40 mg total) by mouth 3 (three) times a week. Patient not taking: Reported on 03/03/2015 07/16/13   Antonieta Ibaimothy J Gollan, MD  tamsulosin (FLOMAX) 0.4 MG CAPS capsule TAKE 1 CAPSULE BY MOUTH EVERY DAY 02/19/15  Yes Maple Hudsonichard L Gilbert Jr., MD    ROS:  Out of a complete 14 system review of symptoms, the patient complains only of the following symptoms, and all other reviewed systems are negative.  Back pain, achy muscles, walking difficulty, neck pain Numbness, weakness, tremors  Blood pressure 129/71, pulse 63, height 5\' 8"  (1.727 m), weight 182 lb (82.555 kg).  Physical Exam  General: The patient is alert and cooperative at the time of the examination.  Skin: No significant peripheral edema is noted.   Neurologic Exam  Mental status: The patient is alert and oriented x 3 at the time of the examination. The patient has apparent normal recent and remote memory, with an apparently normal attention span and concentration ability.   Cranial nerves: Facial symmetry is present. Speech is normal, no aphasia or dysarthria is noted. Extraocular movements are full. Visual fields are full.  Motor: The patient has good strength in all 4 extremities, with exception of 4/5 strength of the intrinsic muscles of the hands bilaterally, and the triceps muscles bilaterally, with 4+/5 strength  of the hip flexors, with severe bilateral foot drops seen.  Sensory examination: Soft touch sensation is symmetric on the face, arms, and legs. The patient has sensory impairment up to the knees bilaterally.  Coordination: The patient has good finger-nose-finger and heel-to-shin bilaterally. An intention tremor seen with finger-nose-finger bilaterally.  Gait and station: The patient has a wide-based, steppage gait pattern. Romberg is positive. Tandem gait was not attempted.  Reflexes: Deep tendon reflexes are symmetric, but are depressed absent bilaterally.   Assessment/Plan:  1. Diabetes  2. Peripheral neuropathy  3. Gait disorder  4. Chronic low back pain  The patient is limited with his low back pain at this time. He indicates that he will be getting a lumbar support brace to see if this helps. The patient  is not requiring medications for his neuropathy pain at this point. He will follow-up in about 9 months. He remained stable off of the IVIG.  Marlan Palau MD 03/03/2015 9:06 PM  [facility] [address] [place], [place] [address]  Phone [phone] Fax [phone]

## 2015-03-03 NOTE — Patient Instructions (Signed)

## 2015-03-04 LAB — PSA: Prostate Specific Ag, Serum: 4.3 ng/mL — ABNORMAL HIGH (ref 0.0–4.0)

## 2015-03-05 ENCOUNTER — Encounter: Payer: Self-pay | Admitting: Family Medicine

## 2015-03-05 ENCOUNTER — Ambulatory Visit (INDEPENDENT_AMBULATORY_CARE_PROVIDER_SITE_OTHER): Payer: Medicare Other | Admitting: Family Medicine

## 2015-03-05 VITALS — BP 124/72 | HR 62 | Temp 97.8°F | Resp 16 | Wt 180.0 lb

## 2015-03-05 DIAGNOSIS — I25119 Atherosclerotic heart disease of native coronary artery with unspecified angina pectoris: Secondary | ICD-10-CM

## 2015-03-05 DIAGNOSIS — M545 Low back pain: Secondary | ICD-10-CM

## 2015-03-05 DIAGNOSIS — E1129 Type 2 diabetes mellitus with other diabetic kidney complication: Secondary | ICD-10-CM | POA: Diagnosis not present

## 2015-03-05 DIAGNOSIS — Z23 Encounter for immunization: Secondary | ICD-10-CM

## 2015-03-05 DIAGNOSIS — I159 Secondary hypertension, unspecified: Secondary | ICD-10-CM

## 2015-03-05 DIAGNOSIS — G6181 Chronic inflammatory demyelinating polyneuritis: Secondary | ICD-10-CM

## 2015-03-05 DIAGNOSIS — E1165 Type 2 diabetes mellitus with hyperglycemia: Secondary | ICD-10-CM | POA: Diagnosis not present

## 2015-03-05 DIAGNOSIS — IMO0002 Reserved for concepts with insufficient information to code with codable children: Secondary | ICD-10-CM

## 2015-03-05 MED ORDER — FENTANYL 100 MCG/HR TD PT72: 100.0000 ug | MEDICATED_PATCH | TRANSDERMAL | Status: DC

## 2015-03-05 NOTE — Progress Notes (Addendum)
Patient ID: Daniel Anthony, male   DOB: 08-02-1951, 64 y.o.   MRN: [id number]    Subjective:  HPI  Hypertension, follow-up:  BP Readings from Last 3 Encounters:  03/05/15 124/72  03/03/15 129/71  10/14/14 136/64    He was last seen for hypertension 1 years ago.  BP at that visit was 118/56. Management changes since that visit include none. He reports good compliance with treatment. He is not having side effects.  He is not exercising.  Weight trend: stable Wt Readings from Last 3 Encounters:  03/05/15 180 lb (81.647 kg)  03/03/15 182 lb (82.555 kg)  10/14/14 180 lb (81.647 kg)    ------------------------------------------------------------------------  Pt is here for a follow of DM, and chronic pain. He reports that he had an A1C done at his kidney doctor and it was 7.3.  Pt is having back pain and is getting worse. He also wants to discuss Zoster vaccine.   Prior to Admission medications   Medication Sig Start Date End Date Taking? Authorizing Provider  amLODipine (NORVASC) 10 MG tablet Take 10 mg by mouth daily.  10/27/12  Yes Vassie Loll, MD  aspirin 325 MG tablet Take 325 mg by mouth daily.   Yes Historical Provider, MD  calcitRIOL (ROCALTROL) 0.25 MCG capsule Take 0.25 mcg by mouth daily.    Yes Historical Provider, MD  calcium acetate (PHOSLO) 667 MG capsule Take 667 mg by mouth 3 (three) times daily with meals.   Yes Historical Provider, MD  carvedilol (COREG) 25 MG tablet Take 25 mg by mouth 2 (two) times daily with a meal.    Yes Historical Provider, MD  cinacalcet (SENSIPAR) 30 MG tablet Take 30 mg by mouth daily.   Yes Historical Provider, MD  docusate sodium (COLACE) 250 MG capsule Take 1 capsule (250 mg total) by mouth 2 (two) times daily. 10/27/12  Yes Vassie Loll, MD  escitalopram (LEXAPRO) 20 MG tablet TAKE 1 TABLET BY MOUTH EVERY DAY 02/19/15  Yes Maple Hudson., MD  fentaNYL (DURAGESIC - DOSED MCG/HR) 100 MCG/HR Place 1 patch (100 mcg total) onto the  skin every 3 (three) days. 01/21/15  Yes Librado Guandique Hulen Shouts., MD  insulin detemir (LEVEMIR) 100 UNIT/ML injection Inject 42 Units into the skin at bedtime.    Yes Historical Provider, MD  LEVEMIR FLEXTOUCH 100 UNIT/ML Pen INJECT 45 UNITS UNDER THE SKIN EVERY NIGHT AT BEDTIME 01/20/15  Yes Maple Hudson., MD  lidocaine-prilocaine (EMLA) cream Apply 2.5 application topically as needed. 08/21/13  Yes Historical Provider, MD  multivitamin (RENA-VIT) TABS tablet Take 1 tablet by mouth daily.   Yes Historical Provider, MD  nitroGLYCERIN (NITROSTAT) 0.4 MG SL tablet Place 1 tablet (0.4 mg total) under the tongue every 5 (five) minutes as needed for chest pain. 01/21/15  Yes Kiani Wurtzel Hulen Shouts., MD  NOVOLOG FLEXPEN 100 UNIT/ML FlexPen 100 Units. 12 units three times daily before meals 06/03/14  Yes Historical Provider, MD  polyethylene glycol (MIRALAX / GLYCOLAX) packet Take 17 g by mouth 3 (three) times a week.   Yes Historical Provider, MD  tamsulosin (FLOMAX) 0.4 MG CAPS capsule TAKE 1 CAPSULE BY MOUTH EVERY DAY 02/19/15  Yes Maple Hudson., MD  rosuvastatin (CRESTOR) 40 MG tablet Take 1 tablet (40 mg total) by mouth 3 (three) times a week. Patient not taking: Reported on 03/05/2015 07/16/13   Antonieta Iba, MD    Patient Active Problem List   Diagnosis Date Noted  .  Radiculopathy with lower extremity symptoms 10/14/2014  . Orthostatic hypotension 02/07/2014  . Fever 08/16/2013  . Altered mental status 08/16/2013  . ESRD (end stage renal disease) 08/16/2013  . Leukocytosis 08/16/2013  . Elevated liver function tests 08/16/2013  . Viral intestinal infection 08/16/2013  . HTN (hypertension) 07/23/2013  . Abnormality of gait 05/10/2013  . Foot drop, bilateral 05/10/2013  . Diabetic polyneuropathy 12/13/2012  . Intractable vomiting 10/24/2012  . CIDP (chronic inflammatory demyelinating polyneuropathy) 10/24/2012  . Throat tightness 12/23/2011  . SOB (shortness of breath) 12/22/2010  .  DM (diabetes mellitus), type 2, uncontrolled, with renal complications 12/22/2010  . Hyperlipemia 12/22/2010  . CAD (coronary artery disease) 12/22/2010    Past Medical History  Diagnosis Date  . Bradycardia     Hx of  . Pleural effusion, right   . Hypomagnesemia   . Hypertension   . Diabetes mellitus   . Hyperlipidemia   . Coronary artery disease   . 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 MI (myocardial infarction) 1995    anterior  . CIDP (chronic inflammatory demyelinating polyneuropathy)   . Polyneuropathy in diabetes(357.2) 12/13/2012  . Abnormality of gait   . Foot drop, bilateral 05/10/2013  . Kidney disease   . End-stage renal disease   . Dialysis patient     History   Social History  . Marital Status: Married    Spouse Name: Daniel BraunKaren  . Number of Children: 1  . Years of Education: college   Occupational History  .      RetiredSystems developer- Doctor   Social History Main Topics  . Smoking status: Former Smoker    Types: Cigarettes    Quit date: 08/17/1979  . Smokeless tobacco: Never Used  . Alcohol Use: No  . Drug Use: No  . Sexual Activity: Not on file   Other Topics Concern  . Not on file   Social History Narrative   ** Merged History Encounter **    Patient lives at home with his wife Daniel Braun(Anthony). Patient is retired. Patient has college education.   Right handed.   Caffeine- 1 cup daily          Allergies  Allergen Reactions  . Zosyn [Piperacillin Sod-Tazobactam So] Itching and Rash    Review of Systems  Constitutional: Negative.   HENT: Negative.   Eyes: Negative.   Respiratory: Negative.   Cardiovascular: Negative.   Gastrointestinal: Negative.   Genitourinary: Negative.   Musculoskeletal: Positive for back pain.  Skin: Negative.   Neurological: Negative.   Endo/Heme/Allergies: Negative.   Psychiatric/Behavioral: Negative.      There is no immunization history on file for this patient. Objective:  BP 124/72 mmHg   Pulse 62  Temp(Src) 97.8 F (36.6 C) (Oral)  Resp 16  Wt 180 lb (81.647 kg)  Physical Exam  Constitutional: He is oriented to person, place, and time and well-developed, well-nourished, and in no distress.  HENT:  Head: Normocephalic.  Right Ear: External ear normal.  Left Ear: External ear normal.  Nose: Nose normal.  Eyes: Conjunctivae and EOM are normal. Pupils are equal, round, and reactive to light.  Neck: Normal range of motion. Neck supple.  Cardiovascular: Normal rate, regular rhythm, normal heart sounds and intact distal pulses.   Pulmonary/Chest: Effort normal and breath sounds normal.  Abdominal: Soft. Bowel sounds are normal.  Musculoskeletal: Normal range of motion.  Neurological: He is alert and oriented to person, place, and time.  Gait  is unstable with some foot drop and he is very wide-based to try to keep his balance. Chronic neuropathy leads leaves him with almost no sensation in his feet and legs loss of proprioception.  Skin: Skin is warm and dry.  Subcutaneous scar tissue on much of abdomen from damage from chronic use of a heating pad. The reason I mention this is that it affects his sites that he can inject for his insulin. A lot of this subcutaneous scan of his abdomen is damaged.  Psychiatric: Mood, memory, affect and judgment normal.    Lab Results  Component Value Date   WBC 11.6* 08/16/2013   HGB 13.1 08/16/2013   HCT 37.8* 08/16/2013   PLT 171 08/16/2013   GLUCOSE 249* 08/16/2013   TSH 0.304* 10/24/2012   PSA 4.3* 03/03/2015   HGBA1C 9.3* 10/24/2012    CMP     Component Value Date/Time   NA 133* 08/16/2013 0609   NA 137 11/30/2012 1301   K 4.4 08/16/2013 0609   K 3.0* 01/18/2013 1020   CL 88* 08/16/2013 0609   CL 103 11/30/2012 1301   CO2 23 08/16/2013 0609   CO2 26 11/30/2012 1301   GLUCOSE 249* 08/16/2013 0609   GLUCOSE 153* 01/18/2013 1020   BUN 28* 08/16/2013 0609   BUN 46* 11/30/2012 1301   CREATININE 3.48* 08/16/2013 0609    CREATININE 4.96* 11/30/2012 1301   CALCIUM 8.7 08/16/2013 0609   CALCIUM 8.9 11/30/2012 1301   PROT 7.2 08/16/2013 0609   PROT 6.8 05/17/2013 1131   ALBUMIN 3.0* 08/16/2013 0609   AST 71* 08/16/2013 0609   ALT 120* 08/16/2013 0609   ALKPHOS 187* 08/16/2013 0609   BILITOT 0.5 08/16/2013 0609   GFRNONAA 17* 08/16/2013 0609   GFRNONAA 12* 11/30/2012 1301   GFRAA 20* 08/16/2013 0609   GFRAA 14* 11/30/2012 1301    Assessment and Plan :  1. DM (diabetes mellitus), type 2, uncontrolled, with renal complications   2. Secondary hypertension, unspecified   3. Coronary artery disease involving native coronary artery of native heart with angina pectoris   4. CIDP (chronic inflammatory demyelinating polyneuropathy) Pain is controlled with the Duragesic. - fentaNYL (DURAGESIC - DOSED MCG/HR) 100 MCG/HR; Place 1 patch (100 mcg total) onto the skin every 3 (three) days. 06/17/15  Dispense: 10 patch; Refill: 0  5. Low back pain, unspecified back pain laterality, with sciatica presence unspecified Mechanical low back pain most likely. Sacroiliitis certainly possible. Degenerative disc disease and facet arthropathy also likely. - Ambulatory referral to Physical Therapy  6. Need for Zostavax administration  - Varicella-zoster vaccine subcutaneous; Standing - Varicella-zoster vaccine subcutaneous 7. Depression This is been stable for several years. Lexapro for life. 8 chronic renal failure. Wife does home hemodialysis. Patient sees Dr. Idell Pickles returning monthly and he handles almost all of his medical care. He is extremely thorough and competent. Patient was seen and examined by Dr. Julieanne Manson, and noted scribed by Dimas Chyle, CMA  Julieanne Manson MD [facility] 03/05/2015 4:30 PM  Addendum. 9. Elevated PSA In the past year patient has had a PSA go from 3.7 to 4.3 recently. He is oliguric with his renal failure so no definite obstructive  symptoms. Father had prostate cancer so patient is a little bit concerned about this. We will repeat this in 6 months.  Type 2 diabetes mellitus A1c today is 7.3 with good control. Patient  is inquiring about new insulins. I think he is just  as well off staying with his present regimen.

## 2015-03-17 ENCOUNTER — Other Ambulatory Visit: Payer: Self-pay | Admitting: Family Medicine

## 2015-03-17 DIAGNOSIS — G6181 Chronic inflammatory demyelinating polyneuritis: Secondary | ICD-10-CM

## 2015-03-17 MED ORDER — FENTANYL 75 MCG/HR TD PT72: 75.0000 ug | MEDICATED_PATCH | TRANSDERMAL | Status: DC

## 2015-05-25 ENCOUNTER — Emergency Department
Admission: EM | Admit: 2015-05-25 | Discharge: 2015-05-26 | Disposition: A | Discharge status: Other Acute Inpatient Hospital | Payer: Medicare Other | Attending: Emergency Medicine | Admitting: Emergency Medicine

## 2015-05-25 ENCOUNTER — Emergency Department: Payer: Medicare Other

## 2015-05-25 ENCOUNTER — Encounter: Payer: Self-pay | Admitting: Emergency Medicine

## 2015-05-25 DIAGNOSIS — E1165 Type 2 diabetes mellitus with hyperglycemia: Secondary | ICD-10-CM | POA: Insufficient documentation

## 2015-05-25 DIAGNOSIS — Z79899 Other long term (current) drug therapy: Secondary | ICD-10-CM | POA: Insufficient documentation

## 2015-05-25 DIAGNOSIS — Z7982 Long term (current) use of aspirin: Secondary | ICD-10-CM | POA: Diagnosis not present

## 2015-05-25 DIAGNOSIS — I120 Hypertensive chronic kidney disease with stage 5 chronic kidney disease or end stage renal disease: Secondary | ICD-10-CM | POA: Diagnosis not present

## 2015-05-25 DIAGNOSIS — E1122 Type 2 diabetes mellitus with diabetic chronic kidney disease: Secondary | ICD-10-CM | POA: Insufficient documentation

## 2015-05-25 DIAGNOSIS — N186 End stage renal disease: Secondary | ICD-10-CM | POA: Insufficient documentation

## 2015-05-25 DIAGNOSIS — E1142 Type 2 diabetes mellitus with diabetic polyneuropathy: Secondary | ICD-10-CM | POA: Insufficient documentation

## 2015-05-25 DIAGNOSIS — Z992 Dependence on renal dialysis: Secondary | ICD-10-CM | POA: Diagnosis not present

## 2015-05-25 DIAGNOSIS — I214 Non-ST elevation (NSTEMI) myocardial infarction: Secondary | ICD-10-CM | POA: Diagnosis not present

## 2015-05-25 DIAGNOSIS — Z794 Long term (current) use of insulin: Secondary | ICD-10-CM | POA: Diagnosis not present

## 2015-05-25 DIAGNOSIS — Z87891 Personal history of nicotine dependence: Secondary | ICD-10-CM | POA: Diagnosis not present

## 2015-05-25 DIAGNOSIS — R739 Hyperglycemia, unspecified: Secondary | ICD-10-CM | POA: Diagnosis present

## 2015-05-25 LAB — CBC
HCT: 24.9 % — ABNORMAL LOW (ref 40.0–52.0)
Hemoglobin: 8.1 g/dL — ABNORMAL LOW (ref 13.0–18.0)
MCH: 30.9 pg (ref 26.0–34.0)
MCHC: 32.4 g/dL (ref 32.0–36.0)
MCV: 95.5 fL (ref 80.0–100.0)
Platelets: 183 10*3/uL (ref 150–440)
RBC: 2.61 MIL/uL — AB (ref 4.40–5.90)
RDW: 15.4 % — ABNORMAL HIGH (ref 11.5–14.5)
WBC: 9.4 10*3/uL (ref 3.8–10.6)

## 2015-05-25 LAB — BASIC METABOLIC PANEL
ANION GAP: 18 — AB (ref 5–15)
BUN: 54 mg/dL — ABNORMAL HIGH (ref 6–20)
CO2: 22 mmol/L (ref 22–32)
CREATININE: 7.2 mg/dL — AB (ref 0.61–1.24)
Calcium: 7.9 mg/dL — ABNORMAL LOW (ref 8.9–10.3)
Chloride: 89 mmol/L — ABNORMAL LOW (ref 101–111)
GFR calc Af Amer: 8 mL/min — ABNORMAL LOW (ref >60)
GFR, EST NON AFRICAN AMERICAN: 7 mL/min — AB (ref >60)
Glucose, Bld: 688 mg/dL (ref 65–99)
Potassium: 4.9 mmol/L (ref 3.5–5.1)
SODIUM: 129 mmol/L — AB (ref 135–145)

## 2015-05-25 LAB — GLUCOSE, CAPILLARY: Glucose-Capillary: >600 mg/dL (ref 65–99)

## 2015-05-25 LAB — TROPONIN I: Troponin I: 1.31 ng/mL — ABNORMAL HIGH (ref <0.031)

## 2015-05-25 MED ORDER — KETOROLAC TROMETHAMINE 30 MG/ML IJ SOLN
30.0000 mg | Freq: Once | INTRAMUSCULAR | Status: AC
  Administered 2015-05-25: 30 mg via INTRAVENOUS
  Filled 2015-05-25: qty 1

## 2015-05-25 MED ORDER — HYDROMORPHONE HCL 1 MG/ML IJ SOLN
1.0000 mg | Freq: Once | INTRAMUSCULAR | Status: AC
  Administered 2015-05-26: 1 mg via INTRAVENOUS
  Filled 2015-05-25: qty 1

## 2015-05-25 MED ORDER — SODIUM CHLORIDE 0.9 % IV BOLUS (SEPSIS)
500.0000 mL | Freq: Once | INTRAVENOUS | Status: AC
Start: 2015-05-25 — End: 2015-05-25
  Administered 2015-05-25: 500 mL via INTRAVENOUS

## 2015-05-25 MED ORDER — ATROPINE SULFATE 0.1 MG/ML IJ SOLN
INTRAMUSCULAR | Status: AC
  Filled 2015-05-25: qty 10

## 2015-05-25 MED ORDER — HEPARIN SODIUM (PORCINE) 5000 UNIT/ML IJ SOLN: 5000.0000 [IU] | Freq: Once | INTRAMUSCULAR | Status: DC

## 2015-05-25 MED ORDER — ASPIRIN 81 MG PO CHEW
324.0000 mg | CHEWABLE_TABLET | Freq: Once | ORAL | Status: AC
  Administered 2015-05-25: 324 mg via ORAL
  Filled 2015-05-25: qty 4

## 2015-05-25 NOTE — ED Notes (Addendum)
Pt arrived via EMS from home.  CBG at home was reading high which is over 600 on meter.  EMS had high reading as well.  Was given insulin at home. C/o right side jaw pain which he has had for the past year but has increased in the past 3 days. Wife states he went unresponsive for about 1.5 minutes and wife said his pupils were pinpoint and had a seizure like episode. Pt has also been having some hypotension as well lately with HD.  Does home hemodialysis. Took 42 units Levemir and 12 units of Humulin R around 930 pm,  Also took Ativan 0.5 at 800p and an additional dose at 830.  Took 1/2 oxycodone 1/2 tab also at 900pm.  Has Duragesic patch on as well to right arm.

## 2015-05-25 NOTE — ED Notes (Signed)
Report received from [place], [place]. Pt resting in bed, readjusted for comfort. Skin is pale. Family at bedside. Pt reports no improvement in jaw pain. md notified. Call bell at side, additional warm blankets provided for comfort.

## 2015-05-25 NOTE — ED Notes (Signed)
Dr. Joie Bimler in to speak with pt and pt's family.

## 2015-05-25 NOTE — ED Notes (Signed)
Pt states is now having chest pain 6/10. Pt questioning if he should have ntg sl at this time. Pt requesting no ntg paste "because it blows the top of my head off." dr. Joie Bimler notified of pt's chest pain and ntg request. No new orders received. Pt updated.

## 2015-05-25 NOTE — ED Notes (Signed)
Pt had possible syncopal episode and became bradycardia into the low 40s and upper 30s and unresponsive briefly. Able to arouse by voice and slight sternal rub.  Dr. Huel Cote notified.  Continued to watch patient and did bounce back to 98-99.

## 2015-05-26 ENCOUNTER — Encounter (HOSPITAL_COMMUNITY): Payer: Self-pay | Admitting: *Deleted

## 2015-05-26 ENCOUNTER — Inpatient Hospital Stay (HOSPITAL_COMMUNITY)
Admission: AD | Admit: 2015-05-26 | Discharge: 2015-06-09 | DRG: 233 | Disposition: A | Discharge status: Home Health | Payer: Medicare Other | Source: Other Acute Inpatient Hospital | Attending: Cardiothoracic Surgery | Admitting: Cardiothoracic Surgery

## 2015-05-26 ENCOUNTER — Ambulatory Visit (HOSPITAL_COMMUNITY): Payer: Medicare Other

## 2015-05-26 ENCOUNTER — Other Ambulatory Visit: Payer: Self-pay | Admitting: *Deleted

## 2015-05-26 ENCOUNTER — Encounter (HOSPITAL_COMMUNITY)
Admission: AD | Disposition: A | Discharge status: Home Health | Payer: Self-pay | Source: Other Acute Inpatient Hospital | Attending: Cardiothoracic Surgery

## 2015-05-26 DIAGNOSIS — J9811 Atelectasis: Secondary | ICD-10-CM | POA: Diagnosis not present

## 2015-05-26 DIAGNOSIS — E119 Type 2 diabetes mellitus without complications: Secondary | ICD-10-CM | POA: Diagnosis not present

## 2015-05-26 DIAGNOSIS — N186 End stage renal disease: Secondary | ICD-10-CM | POA: Diagnosis present

## 2015-05-26 DIAGNOSIS — D696 Thrombocytopenia, unspecified: Secondary | ICD-10-CM | POA: Diagnosis present

## 2015-05-26 DIAGNOSIS — E1143 Type 2 diabetes mellitus with diabetic autonomic (poly)neuropathy: Secondary | ICD-10-CM | POA: Diagnosis present

## 2015-05-26 DIAGNOSIS — I2510 Atherosclerotic heart disease of native coronary artery without angina pectoris: Secondary | ICD-10-CM

## 2015-05-26 DIAGNOSIS — R748 Abnormal levels of other serum enzymes: Secondary | ICD-10-CM | POA: Diagnosis not present

## 2015-05-26 DIAGNOSIS — D638 Anemia in other chronic diseases classified elsewhere: Secondary | ICD-10-CM | POA: Diagnosis present

## 2015-05-26 DIAGNOSIS — R7989 Other specified abnormal findings of blood chemistry: Secondary | ICD-10-CM

## 2015-05-26 DIAGNOSIS — E1142 Type 2 diabetes mellitus with diabetic polyneuropathy: Secondary | ICD-10-CM | POA: Diagnosis not present

## 2015-05-26 DIAGNOSIS — E1165 Type 2 diabetes mellitus with hyperglycemia: Secondary | ICD-10-CM | POA: Diagnosis present

## 2015-05-26 DIAGNOSIS — I499 Cardiac arrhythmia, unspecified: Secondary | ICD-10-CM | POA: Diagnosis not present

## 2015-05-26 DIAGNOSIS — Z992 Dependence on renal dialysis: Secondary | ICD-10-CM

## 2015-05-26 DIAGNOSIS — E8770 Fluid overload, unspecified: Secondary | ICD-10-CM | POA: Diagnosis present

## 2015-05-26 DIAGNOSIS — I255 Ischemic cardiomyopathy: Secondary | ICD-10-CM | POA: Diagnosis present

## 2015-05-26 DIAGNOSIS — R001 Bradycardia, unspecified: Secondary | ICD-10-CM | POA: Diagnosis present

## 2015-05-26 DIAGNOSIS — Z794 Long term (current) use of insulin: Secondary | ICD-10-CM

## 2015-05-26 DIAGNOSIS — Z833 Family history of diabetes mellitus: Secondary | ICD-10-CM | POA: Diagnosis not present

## 2015-05-26 DIAGNOSIS — Z7982 Long term (current) use of aspirin: Secondary | ICD-10-CM | POA: Diagnosis not present

## 2015-05-26 DIAGNOSIS — E11649 Type 2 diabetes mellitus with hypoglycemia without coma: Secondary | ICD-10-CM | POA: Diagnosis not present

## 2015-05-26 DIAGNOSIS — Z8249 Family history of ischemic heart disease and other diseases of the circulatory system: Secondary | ICD-10-CM | POA: Diagnosis not present

## 2015-05-26 DIAGNOSIS — G8929 Other chronic pain: Secondary | ICD-10-CM | POA: Diagnosis not present

## 2015-05-26 DIAGNOSIS — G6181 Chronic inflammatory demyelinating polyneuritis: Secondary | ICD-10-CM | POA: Diagnosis not present

## 2015-05-26 DIAGNOSIS — I252 Old myocardial infarction: Secondary | ICD-10-CM | POA: Diagnosis not present

## 2015-05-26 DIAGNOSIS — Z79899 Other long term (current) drug therapy: Secondary | ICD-10-CM

## 2015-05-26 DIAGNOSIS — I498 Other specified cardiac arrhythmias: Secondary | ICD-10-CM

## 2015-05-26 DIAGNOSIS — E876 Hypokalemia: Secondary | ICD-10-CM | POA: Diagnosis present

## 2015-05-26 DIAGNOSIS — R55 Syncope and collapse: Secondary | ICD-10-CM | POA: Diagnosis present

## 2015-05-26 DIAGNOSIS — I120 Hypertensive chronic kidney disease with stage 5 chronic kidney disease or end stage renal disease: Secondary | ICD-10-CM | POA: Diagnosis present

## 2015-05-26 DIAGNOSIS — M21372 Foot drop, left foot: Secondary | ICD-10-CM | POA: Diagnosis present

## 2015-05-26 DIAGNOSIS — E1122 Type 2 diabetes mellitus with diabetic chronic kidney disease: Secondary | ICD-10-CM | POA: Diagnosis present

## 2015-05-26 DIAGNOSIS — K219 Gastro-esophageal reflux disease without esophagitis: Secondary | ICD-10-CM | POA: Diagnosis present

## 2015-05-26 DIAGNOSIS — D6489 Other specified anemias: Secondary | ICD-10-CM

## 2015-05-26 DIAGNOSIS — D62 Acute posthemorrhagic anemia: Secondary | ICD-10-CM | POA: Diagnosis not present

## 2015-05-26 DIAGNOSIS — Z01810 Encounter for preprocedural cardiovascular examination: Secondary | ICD-10-CM | POA: Diagnosis not present

## 2015-05-26 DIAGNOSIS — M21371 Foot drop, right foot: Secondary | ICD-10-CM | POA: Diagnosis present

## 2015-05-26 DIAGNOSIS — R0602 Shortness of breath: Secondary | ICD-10-CM

## 2015-05-26 DIAGNOSIS — Z955 Presence of coronary angioplasty implant and graft: Secondary | ICD-10-CM | POA: Diagnosis not present

## 2015-05-26 DIAGNOSIS — Z881 Allergy status to other antibiotic agents status: Secondary | ICD-10-CM | POA: Diagnosis not present

## 2015-05-26 DIAGNOSIS — K3184 Gastroparesis: Secondary | ICD-10-CM | POA: Diagnosis present

## 2015-05-26 DIAGNOSIS — Z87891 Personal history of nicotine dependence: Secondary | ICD-10-CM | POA: Diagnosis not present

## 2015-05-26 DIAGNOSIS — I25110 Atherosclerotic heart disease of native coronary artery with unstable angina pectoris: Secondary | ICD-10-CM | POA: Diagnosis present

## 2015-05-26 DIAGNOSIS — I214 Non-ST elevation (NSTEMI) myocardial infarction: Secondary | ICD-10-CM

## 2015-05-26 DIAGNOSIS — Z951 Presence of aortocoronary bypass graft: Secondary | ICD-10-CM

## 2015-05-26 DIAGNOSIS — E785 Hyperlipidemia, unspecified: Secondary | ICD-10-CM | POA: Diagnosis present

## 2015-05-26 DIAGNOSIS — R5381 Other malaise: Secondary | ICD-10-CM | POA: Diagnosis not present

## 2015-05-26 DIAGNOSIS — R008 Other abnormalities of heart beat: Secondary | ICD-10-CM | POA: Diagnosis not present

## 2015-05-26 HISTORY — PX: CARDIAC CATHETERIZATION: SHX172

## 2015-05-26 LAB — PROTIME-INR
INR: 1.41 (ref 0.00–1.49)
Prothrombin Time: 17.4 seconds — ABNORMAL HIGH (ref 11.6–15.2)

## 2015-05-26 LAB — MRSA PCR SCREENING: MRSA BY PCR: NEGATIVE

## 2015-05-26 LAB — GLUCOSE, CAPILLARY
GLUCOSE-CAPILLARY: 406 mg/dL — AB (ref 65–99)
GLUCOSE-CAPILLARY: 178 mg/dL — AB (ref 65–99)
GLUCOSE-CAPILLARY: 79 mg/dL (ref 65–99)
Glucose-Capillary: 128 mg/dL — ABNORMAL HIGH (ref 65–99)
Glucose-Capillary: 180 mg/dL — ABNORMAL HIGH (ref 65–99)
Glucose-Capillary: 335 mg/dL — ABNORMAL HIGH (ref 65–99)
Glucose-Capillary: 398 mg/dL — ABNORMAL HIGH (ref 65–99)
Glucose-Capillary: 516 mg/dL — ABNORMAL HIGH (ref 65–99)

## 2015-05-26 LAB — IRON AND TIBC
Iron: 27 ug/dL — ABNORMAL LOW (ref 45–182)
SATURATION RATIOS: 11 % — AB (ref 17.9–39.5)
TIBC: 242 ug/dL — ABNORMAL LOW (ref 250–450)
UIBC: 215 ug/dL

## 2015-05-26 LAB — BASIC METABOLIC PANEL
Anion gap: 16 — ABNORMAL HIGH (ref 5–15)
BUN: 53 mg/dL — AB (ref 6–20)
CHLORIDE: 88 mmol/L — AB (ref 101–111)
CO2: 26 mmol/L (ref 22–32)
CREATININE: 7.51 mg/dL — AB (ref 0.61–1.24)
Calcium: 7.9 mg/dL — ABNORMAL LOW (ref 8.9–10.3)
GFR calc Af Amer: 8 mL/min — ABNORMAL LOW (ref >60)
GFR calc non Af Amer: 7 mL/min — ABNORMAL LOW (ref >60)
GLUCOSE: 414 mg/dL — AB (ref 65–99)
POTASSIUM: 4.7 mmol/L (ref 3.5–5.1)
Sodium: 130 mmol/L — ABNORMAL LOW (ref 135–145)

## 2015-05-26 LAB — CBC
HEMATOCRIT: 23.3 % — AB (ref 39.0–52.0)
Hemoglobin: 7.7 g/dL — ABNORMAL LOW (ref 13.0–17.0)
MCH: 30.7 pg (ref 26.0–34.0)
MCHC: 33 g/dL (ref 30.0–36.0)
MCV: 92.8 fL (ref 78.0–100.0)
PLATELETS: 227 10*3/uL (ref 150–400)
RBC: 2.51 MIL/uL — ABNORMAL LOW (ref 4.22–5.81)
RDW: 14.9 % (ref 11.5–15.5)
WBC: 15.8 10*3/uL — ABNORMAL HIGH (ref 4.0–10.5)

## 2015-05-26 LAB — FERRITIN: Ferritin: 2292 ng/mL — ABNORMAL HIGH (ref 24–336)

## 2015-05-26 LAB — TROPONIN I: Troponin I: 9.25 ng/mL (ref <0.031)

## 2015-05-26 LAB — VITAMIN B12: Vitamin B-12: 1043 pg/mL — ABNORMAL HIGH (ref 180–914)

## 2015-05-26 LAB — TSH: TSH: 0.583 u[IU]/mL (ref 0.350–4.500)

## 2015-05-26 LAB — MAGNESIUM: MAGNESIUM: 2.4 mg/dL (ref 1.7–2.4)

## 2015-05-26 SURGERY — LEFT HEART CATH AND CORONARY ANGIOGRAPHY

## 2015-05-26 MED ORDER — ESCITALOPRAM OXALATE 20 MG PO TABS
20.0000 mg | ORAL_TABLET | Freq: Every day | ORAL | Status: DC
  Filled 2015-05-26 (×2): qty 1

## 2015-05-26 MED ORDER — HEPARIN (PORCINE) IN NACL 100-0.45 UNIT/ML-% IJ SOLN
1600.0000 [IU]/h | INTRAMUSCULAR | Status: DC
  Administered 2015-05-26: 1000 [IU]/h via INTRAVENOUS
  Administered 2015-05-27: 1150 [IU]/h via INTRAVENOUS
  Administered 2015-05-28: 1450 [IU]/h via INTRAVENOUS
  Filled 2015-05-26 (×7): qty 250

## 2015-05-26 MED ORDER — TAMSULOSIN HCL 0.4 MG PO CAPS
0.4000 mg | ORAL_CAPSULE | Freq: Every day | ORAL | Status: DC
  Filled 2015-05-26 (×2): qty 1

## 2015-05-26 MED ORDER — LIDOCAINE HCL (PF) 1 % IJ SOLN
INTRAMUSCULAR | Status: AC
  Filled 2015-05-26: qty 30

## 2015-05-26 MED ORDER — IOHEXOL 350 MG/ML SOLN
INTRAVENOUS | Status: DC | PRN
  Administered 2015-05-26: 80 mL via INTRAVENOUS

## 2015-05-26 MED ORDER — MIDAZOLAM HCL 2 MG/2ML IJ SOLN
INTRAMUSCULAR | Status: DC | PRN
  Administered 2015-05-26: 1 mg via INTRAVENOUS

## 2015-05-26 MED ORDER — HEPARIN SODIUM (PORCINE) 5000 UNIT/ML IJ SOLN
4000.0000 [IU] | Freq: Once | INTRAMUSCULAR | Status: AC
  Administered 2015-05-26: 4000 [IU] via INTRAVENOUS
  Filled 2015-05-26: qty 1

## 2015-05-26 MED ORDER — INSULIN DETEMIR 100 UNIT/ML ~~LOC~~ SOLN
42.0000 [IU] | Freq: Every day | SUBCUTANEOUS | Status: DC
  Administered 2015-05-26: 42 [IU] via SUBCUTANEOUS
  Filled 2015-05-26 (×2): qty 0.42

## 2015-05-26 MED ORDER — NITROGLYCERIN 1 MG/10 ML FOR IR/CATH LAB
INTRA_ARTERIAL | Status: DC | PRN
  Administered 2015-05-26: 11:00:00

## 2015-05-26 MED ORDER — RENA-VITE PO TABS
1.0000 | ORAL_TABLET | Freq: Every day | ORAL | Status: DC
  Filled 2015-05-26 (×2): qty 1

## 2015-05-26 MED ORDER — LORAZEPAM 0.5 MG PO TABS
0.5000 mg | ORAL_TABLET | Freq: Four times a day (QID) | ORAL | Status: DC | PRN
  Administered 2015-05-26 – 2015-05-29 (×11): 0.5 mg via ORAL
  Filled 2015-05-26 (×11): qty 1

## 2015-05-26 MED ORDER — SODIUM CHLORIDE 0.9 % IJ SOLN: 3.0000 mL | INTRAMUSCULAR | Status: DC | PRN

## 2015-05-26 MED ORDER — SODIUM CHLORIDE 0.9 % IV SOLN: 250.0000 mL | INTRAVENOUS | Status: DC | PRN

## 2015-05-26 MED ORDER — CALCITRIOL 0.25 MCG PO CAPS
0.2500 ug | ORAL_CAPSULE | Freq: Two times a day (BID) | ORAL | Status: DC
  Administered 2015-05-26 – 2015-06-09 (×26): 0.25 ug via ORAL
  Filled 2015-05-26 (×29): qty 1

## 2015-05-26 MED ORDER — FENTANYL 25 MCG/HR TD PT72
100.0000 ug | MEDICATED_PATCH | TRANSDERMAL | Status: DC
  Administered 2015-05-26 – 2015-06-01 (×3): 100 ug via TRANSDERMAL
  Filled 2015-05-26 (×4): qty 1
  Filled 2015-05-26: qty 4

## 2015-05-26 MED ORDER — SODIUM CHLORIDE 0.9 % IV SOLN
250.0000 mL | INTRAVENOUS | Status: DC | PRN
  Administered 2015-05-30: 07:00:00 via INTRAVENOUS

## 2015-05-26 MED ORDER — CALCIUM ACETATE (PHOS BINDER) 667 MG PO CAPS
667.0000 mg | ORAL_CAPSULE | Freq: Three times a day (TID) | ORAL | Status: DC
  Administered 2015-05-27 – 2015-06-08 (×18): 667 mg via ORAL
  Filled 2015-05-26 (×39): qty 1

## 2015-05-26 MED ORDER — INSULIN ASPART 100 UNIT/ML ~~LOC~~ SOLN
0.0000 [IU] | SUBCUTANEOUS | Status: DC
  Administered 2015-05-26: 7 [IU] via SUBCUTANEOUS
  Administered 2015-05-26: 1 [IU] via SUBCUTANEOUS
  Administered 2015-05-26: 9 [IU] via SUBCUTANEOUS
  Administered 2015-05-26: 2 [IU] via SUBCUTANEOUS
  Administered 2015-05-27 – 2015-05-28 (×2): 3 [IU] via SUBCUTANEOUS
  Administered 2015-05-28 – 2015-05-29 (×3): 2 [IU] via SUBCUTANEOUS
  Administered 2015-05-29: 1 [IU] via SUBCUTANEOUS
  Administered 2015-05-29 – 2015-05-30 (×2): 5 [IU] via SUBCUTANEOUS
  Administered 2015-05-30: 3 [IU] via SUBCUTANEOUS

## 2015-05-26 MED ORDER — SODIUM CHLORIDE 0.9 % IJ SOLN
3.0000 mL | Freq: Two times a day (BID) | INTRAMUSCULAR | Status: DC
  Administered 2015-05-26: 3 mL via INTRAVENOUS

## 2015-05-26 MED ORDER — FENTANYL CITRATE (PF) 100 MCG/2ML IJ SOLN
INTRAMUSCULAR | Status: AC
  Filled 2015-05-26: qty 4

## 2015-05-26 MED ORDER — ASPIRIN 81 MG PO CHEW: 81.0000 mg | CHEWABLE_TABLET | ORAL | Status: DC

## 2015-05-26 MED ORDER — HEPARIN (PORCINE) IN NACL 100-0.45 UNIT/ML-% IJ SOLN
12.0000 [IU]/kg/h | Freq: Once | INTRAMUSCULAR | Status: AC
  Administered 2015-05-26: 12 [IU]/kg/h via INTRAVENOUS
  Filled 2015-05-26: qty 250

## 2015-05-26 MED ORDER — SODIUM CHLORIDE 0.9 % IJ SOLN: 3.0000 mL | Freq: Two times a day (BID) | INTRAMUSCULAR | Status: DC

## 2015-05-26 MED ORDER — SODIUM CHLORIDE 0.9 % IV SOLN: INTRAVENOUS | Status: DC

## 2015-05-26 MED ORDER — SODIUM CHLORIDE 0.9 % IV SOLN
INTRAVENOUS | Status: DC
  Administered 2015-05-26: 06:00:00 via INTRAVENOUS

## 2015-05-26 MED ORDER — MIDAZOLAM HCL 2 MG/2ML IJ SOLN
INTRAMUSCULAR | Status: AC
  Filled 2015-05-26: qty 4

## 2015-05-26 MED ORDER — SODIUM CHLORIDE 0.9 % IJ SOLN
3.0000 mL | Freq: Two times a day (BID) | INTRAMUSCULAR | Status: DC
  Administered 2015-05-26 – 2015-05-29 (×3): 3 mL via INTRAVENOUS

## 2015-05-26 MED ORDER — FENTANYL CITRATE (PF) 100 MCG/2ML IJ SOLN
INTRAMUSCULAR | Status: DC | PRN
  Administered 2015-05-26: 50 ug via INTRAVENOUS

## 2015-05-26 MED ORDER — HEPARIN (PORCINE) IN NACL 100-0.45 UNIT/ML-% IJ SOLN
1000.0000 [IU]/h | INTRAMUSCULAR | Status: DC
  Filled 2015-05-26: qty 250

## 2015-05-26 MED ORDER — FENTANYL CITRATE (PF) 100 MCG/2ML IJ SOLN
12.5000 ug | INTRAMUSCULAR | Status: DC | PRN
  Administered 2015-05-26: 12.5 ug via INTRAVENOUS
  Filled 2015-05-26: qty 2

## 2015-05-26 SURGICAL SUPPLY — 10 items
CATH INFINITI 5FR MULTPACK ANG (CATHETERS) ×2 IMPLANT
KIT HEART LEFT (KITS) ×3 IMPLANT
PACK CARDIAC CATHETERIZATION (CUSTOM PROCEDURE TRAY) ×3 IMPLANT
SHEATH PINNACLE 5F 10CM (SHEATH) ×2 IMPLANT
SYR MEDRAD MARK V 150ML (SYRINGE) ×3 IMPLANT
TRANSDUCER W/STOPCOCK (MISCELLANEOUS) ×3 IMPLANT
TUBING CIL FLEX 10 FLL-RA (TUBING) ×3 IMPLANT
WIRE EMERALD 3MM-J .035X150CM (WIRE) ×2 IMPLANT
WIRE EMERALD ST .035X150CM (WIRE) ×2 IMPLANT
WIRE SAFE-T 1.5MM-J .035X260CM (WIRE) IMPLANT

## 2015-05-26 NOTE — Progress Notes (Signed)
ANTICOAGULATION CONSULT NOTE - Follow Up Consult  Pharmacy Consult:  Heparin Indication: chest pain/ACS  Allergies  Allergen Reactions  . Zosyn [Piperacillin Sod-Tazobactam So] Itching and Rash    Patient Measurements: Height:  (172.7 cm) Weight: 185 lb 3 oz (84 kg) IBW/kg (Calculated) : 68.4 Heparin Dosing Weight: 84 kg  Vital Signs: Temp: 98 F (36.7 C) (10/10 1148) Temp Source: Oral (10/10 1148) BP: 98/57 mmHg (10/10 1225) Pulse Rate: 77 (10/10 1225)  Labs:  Recent Labs  05/25/15 2225 05/26/15 0508  HGB 8.1* 7.7*  HCT 24.9* 23.3*  PLT 183 227  LABPROT  --  17.4*  INR  --  1.41  CREATININE 7.20* 7.51*  TROPONINI 1.31* 9.25*    Estimated Creatinine Clearance: 10.5 mL/min (by C-G formula based on Cr of 7.51).      Assessment: 54 YOM with ACS, now s/p cath and Pharmacy consulted to resume IV heparin 8 hours post sheath removal.  Sheath was removed around 1130 and no bleeding reported.   Goal of Therapy:  Heparin level 0.3-0.7 units/ml Monitor platelets by anticoagulation protocol: Yes    Plan:  - At 1930 tonight, resume heparin gtt at 1000 units/hr, no bolus s/p cath - Check 8 hr HL - Daily HL / CBC - Check LFTs in AM   Seynabou Fults D. Laney Potash, PharmD, BCPS Pager:  [phone] 05/26/2015, 12:57 PM

## 2015-05-26 NOTE — Consult Note (Signed)
[address]       [place] [address]             [phone]          AUL MANGIERI [facility] Medical Record #[id number] Date of Birth: 11-Oct-1950  Primary Cardiologist: Julien Nordmann, MD Primary Care: Megan Mans, MD  Chief Complaint:   No chief complaint on file.    History of Present Illness:     The patient is a 64 year old male with a past medical history of ESRD on home hemodialysis, also diabetes mellitus, hypertension, hyperlipidemia and chronic inflammatory demyelinating polyneuropathy. He has a known history of coronary artery disease, status post anterior MI in 1995 with a stent to his mid left circumflex at that time.  His last catheterization in 2006 showed the stent to be patent with minimal irregularities in the LAD and normal LV function. Stress test in 2012 showed no significant ischemia and echocardiogram in February 2012 was essentially normal.   Over the past few weeks, he has been having episodic right jaw pain and chest discomfort. Over the past 2 weeks, this has become more frequent, occurring during the night and after eating or drinking. He does have diabetic gastroparesis and takes Reglan and Nexium but says that that has been under good control. He initially thought he had trigeminal neuralgia, which would usually subside with warm heat but recently it has not improved with this and has been occurring with associated  chest discomfort. He is very inactive due to his demyelinating disease so difficult to discern if there is an exertional component. He used to have problems with his BP being very high, but recently he has had issues with it running low in the 90's and has had to hold his Coreg and Amlodipine on occasion.   According to his wife, while he was on HD at home, he had an episode where he became unresponsive and slumped over. His wife shook him and he opened his eyes but they appeared glazed over and she called  911. This episode lasted about 2 minutes. He was transported to [facility] ER, where he had another episode of unresponsiveness with staring and was noted to have a heart rate drop into the 30's transiently.  He was noted to have an elevated troponin, worsening creatinine, Hbg of 8 and markedly elevated blood sugar at 688. Chest x-ray showed mild volume overload. Troponin was elevated at 1.31 and EKG demonstrated NSR with ST depression in I, aVL, V4-V6 which is new and minimal ST elevation in III. The patient was ruled in for a NSTEMI and subsequently transferred to [facility] for cardiology evaluation and treatment.  He was started on Heparin and underwent cardiac catheterization on 10/10 which revealed severe 3 vessel CAD with severe ostial circumflex, hazy moderate to severe proximal LAD stenosis, severe proximal RCA stenosis, moderately severe LV systolic dysfunction and an LAD filling defect suggestive of thrombus. He was felt to be a poor candidate for further percutaneous intervention, therefore a cardiac surgery consult was requested for consideration of CABG.      Current Activity/ Functional Status: Patient is not independent with mobility/ambulation, transfers, ADL's, IADL's.    Zubrod Score: At the time of surgery this patient's most appropriate activity status/level should be described as:     0    Normal activity, no symptoms     1    Restricted in physical strenuous activity but ambulatory, able  to do out light work []     2    Ambulatory and capable of self care, unable to do work activities, up and about                 more than 50%  Of the time                            [x]     3    Only limited self care, in bed greater than 50% of waking hours []     4    Completely disabled, no self care, confined to bed or chair []     5    Moribund    Past Medical History: Past Medical History  Diagnosis Date  . Bradycardia     Hx of  . Pleural effusion, right   .  Hypomagnesemia   . Hypertension   . Diabetes mellitus   . Hyperlipidemia   . Coronary artery disease   . 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 MI (myocardial infarction) (HCC) 1995    anterior  . CIDP (chronic inflammatory demyelinating polyneuropathy) (HCC)   . Polyneuropathy in diabetes(357.2) 12/13/2012  . Abnormality of gait   . Foot drop, bilateral 05/10/2013  . Kidney disease   . End-stage renal disease (HCC)   . Dialysis patient [facility])      Past Surgical History: Past Surgical History  Procedure Laterality Date  . Tonsillectomy    . Cardiac catheterization  1995    2 stents   . Coronary angioplasty with stent placement  1995  . Lithotripsy       Social History: History  Smoking status  . Former Smoker  . Types: Cigarettes  . Quit date: 08/17/1979  Smokeless tobacco  . Never Used    History  Alcohol Use No    Social History   Social History  . Marital Status: Married    Spouse Name: Clydie Braun  . Number of Children: 1  . Years of Education: college   Occupational History  .      RetiredSystems developer   Social History Main Topics  . Smoking status: Former Smoker    Types: Cigarettes    Quit date: 08/17/1979  . Smokeless tobacco: Never Used  . Alcohol Use: No  . Drug Use: No  . Sexual Activity: Not on file   Other Topics Concern  . Not on file   Social History Narrative   ** Merged History Encounter **    Patient lives at home with his wife Clydie Braun). Patient is retired. Patient has college education.   Right handed.   Caffeine- 1 cup daily           Family History: Family History  Problem Relation Age of Onset  . Heart attack Father   . Hypertension Father   . Diabetes Father   . Cancer Father     prostate  . Hypertension Mother   . Cancer Maternal Grandmother     colon     Allergies: Allergies  Allergen Reactions  . Zosyn [Piperacillin Sod-Tazobactam So] Itching and Rash     Medications: Current  Facility-Administered Medications  Medication Dose Route Frequency Provider Last Rate Last Dose  . 0.9 %  sodium chloride infusion  250 mL Intravenous PRN Roslynn Amble, MD      . 0.9 %  sodium chloride infusion  250 mL Intravenous PRN  Kathleene Hazel, MD      . 0.9 %  sodium chloride infusion  250 mL Intravenous PRN Kathleene Hazel, MD      . calcitRIOL (ROCALTROL) capsule 0.25 mcg  0.25 mcg Oral BID Roslynn Amble, MD   Stopped at 05/26/15 1000  . calcium acetate (PHOSLO) capsule 667 mg  667 mg Oral TID WC Roslynn Amble, MD   Stopped at 05/26/15 0830  . escitalopram (LEXAPRO) tablet 20 mg  20 mg Oral Daily Roslynn Amble, MD   Stopped at 05/26/15 1000  . fentaNYL (DURAGESIC - dosed mcg/hr) patch 100 mcg  100 mcg Transdermal Q72H Kalman Shan, MD   100 mcg at 05/26/15 1330  . heparin ADULT infusion 100 units/mL (25000 units/250 mL)  1,000 Units/hr Intravenous Continuous Gerilyn Nestle, RPH      . insulin aspart (novoLOG) injection 0-9 Units  0-9 Units Subcutaneous 6 times per day Roslynn Amble, MD   1 Units at 05/26/15 1542  . insulin detemir (LEVEMIR) injection 42 Units  42 Units Subcutaneous QHS Roslynn Amble, MD      . LORazepam (ATIVAN) tablet 0.5 mg  0.5 mg Oral Q6H PRN Alyson Reedy, MD   0.5 mg at 05/26/15 1540  . multivitamin (RENA-VIT) tablet 1 tablet  1 tablet Oral Daily Roslynn Amble, MD   Stopped at 05/26/15 1000  . sodium chloride 0.9 % injection 3 mL  3 mL Intravenous Q12H Kathleene Hazel, MD   0 mL at 05/26/15 1515  . sodium chloride 0.9 % injection 3 mL  3 mL Intravenous PRN Kathleene Hazel, MD      . sodium chloride 0.9 % injection 3 mL  3 mL Intravenous Q12H Kathleene Hazel, MD   0 mL at 05/26/15 1115  . sodium chloride 0.9 % injection 3 mL  3 mL Intravenous PRN Kathleene Hazel, MD      . tamsulosin [facility]) capsule 0.4 mg  0.4 mg Oral Daily Roslynn Amble, MD   Stopped at 05/26/15 1000    Prescriptions  prior to admission  Medication Sig Dispense Refill Last Dose  . amLODipine (NORVASC) 10 MG tablet Take 10 mg by mouth daily.    Past Week at Unknown time  . aspirin 325 MG tablet Take 325 mg by mouth daily.   05/25/2015 at Unknown time  . calcitRIOL (ROCALTROL) 0.25 MCG capsule Take 0.25 mcg by mouth 2 (two) times daily.    05/25/2015 at Unknown time  . calcium acetate (PHOSLO) 667 MG capsule Take 667 mg by mouth 3 (three) times daily with meals.   05/25/2015 at Unknown time  . carvedilol (COREG) 25 MG tablet Take 25 mg by mouth daily.    Past Week at Unknown time  . cinacalcet (SENSIPAR) 30 MG tablet Take 30 mg by mouth daily.   05/25/2015 at Unknown time  . docusate sodium (COLACE) 250 MG capsule Take 1 capsule (250 mg total) by mouth 2 (two) times daily. 60 capsule 1 05/25/2015 at Unknown time  . escitalopram (LEXAPRO) 20 MG tablet TAKE 1 TABLET BY MOUTH EVERY DAY 90 tablet 0 05/25/2015 at Unknown time  . fentaNYL (DURAGESIC - DOSED MCG/HR) 75 MCG/HR Place 1 patch (75 mcg total) onto the skin every 3 (three) days. (Patient taking differently: Place 100 mcg onto the skin every 3 (three) days. ) 10 patch 0 05/24/2015 at Unknown time  . insulin detemir (LEVEMIR) 100 UNIT/ML injection Inject 42 Units into  the skin at bedtime.    05/25/2015 at Unknown time  . LEVEMIR FLEXTOUCH 100 UNIT/ML Pen INJECT 45 UNITS UNDER THE SKIN EVERY NIGHT AT BEDTIME (Patient taking differently: 42 units) 15 mL 12 Taking  . lidocaine-prilocaine (EMLA) cream Apply 2.5 application topically as needed.   Past Week at Unknown time  . LORazepam (ATIVAN) 0.5 MG tablet Take 0.5 mg by mouth 2 (two) times daily as needed for anxiety.   05/25/2015 at Unknown time  . multivitamin (RENA-VIT) TABS tablet Take 1 tablet by mouth daily.   05/25/2015 at Unknown time  . nitroGLYCERIN (NITROSTAT) 0.4 MG SL tablet Place 1 tablet (0.4 mg total) under the tongue every 5 (five) minutes as needed for chest pain. (Patient not taking: Reported on 05/25/2015)  50 tablet 12 Not Taking at Unknown time  . NOVOLOG FLEXPEN 100 UNIT/ML FlexPen Inject 14 Units into the skin 3 (three) times daily with meals. 12 units three times daily before meals  12 Past Week at Unknown time  . polyethylene glycol (MIRALAX / GLYCOLAX) packet Take 17 g by mouth 3 (three) times a week.   05/25/2015 at Unknown time  . rosuvastatin (CRESTOR) 40 MG tablet Take 1 tablet (40 mg total) by mouth 3 (three) times a week. 30 tablet 1 05/24/2015 at Unknown time  . tamsulosin (FLOMAX) 0.4 MG CAPS capsule TAKE 1 CAPSULE BY MOUTH EVERY DAY 90 capsule 3 05/25/2015 at Unknown time       Review of Systems:     Cardiac Review of Systems:   Chest Pain [  x  ]  Resting SOB [   ] Exertional SOB  [ n ]  Orthopnea [  ]   Pedal Edema [   ]   Palpitations [  ]  Syncope  [  ]   Presyncope [   ]  General Review of Systems:  Constitional: recent weight change [  ]; anorexia [  ]; fatigue [x  ]; nausea [  ]; night sweats [  ]; fever [  ]; or chills [  ];                                                                   Dental: poor dentition[  ];   Eye : blurred vision [  ]; diplopia [   ]; vision changes [  ];  Amaurosis fugax[  ]; Resp: cough [  ];  wheezing[  ];  hemoptysis[  ]; shortness of breath[  ]; paroxysmal nocturnal dyspnea[  ]; dyspnea on exertion[  ]; or orthopnea[  ];  GI:  gallstones[  ], vomiting[  ];  dysphagia[  ]; melena[  ];  hematochezia [  ]; heartburn[  ];   Hx of  Colonoscopy[  ]; GU: kidney stones [  ]; hematuria[  ];   dysuria [  ];  nocturia[  ];  history of     obstruction [  ]; ESRD, home HD via left forearm AVF             Skin: rash, swelling[  ];, hair loss[  ];  peripheral edema[  ];  or itching[  ]; Musculosketetal: myalgias[  ];  joint swelling[  ];  joint erythema[  ];  joint pain[  ];  back pain[  ];  Heme/Lymph: bruising[  ];  bleeding[  ];  anemia[  ];  Neuro: TIA[  ];  headaches[  ];  stroke[  ];  vertigo[  ];  seizures[  ];   paresthesias[  ];  difficulty  walking[  ]; Bilateral foot drops  Psych:depression[  ]; anxiety[  ];  Endocrine: diabetes[x- A1C=9.3 ];  thyroid dysfunction[  ];             Other:   Physical Exam: BP 108/57 mmHg  Pulse 80  Temp(Src) 98.3 F (36.8 C) (Oral)  Resp 14  Ht  (1.727 m)  Wt 185 lb 3 oz (84 kg)  BMI 28.16 kg/m2  SpO2 98%  General appearance: alert, cooperative and no distress Neurologic: intact Heart: regular rate and rhythm Lungs: clear to auscultation bilaterally Abdomen: soft, non-tender; bowel sounds normal; no masses,  no organomegaly Extremities: extremities normal, atraumatic, no cyanosis or edema   Diagnostic Studies & Laboratory data:     Recent Radiology Findings:   Dg Chest 1 View  05/26/2015   CLINICAL DATA:  Possible syncopal episode with bradycardia and brief episode of unresponsiveness. Hyperglycemia.  EXAM: CHEST 1 VIEW  COMPARISON:  08/16/2013  FINDINGS: Shallow inspiration with atelectasis in the lung bases. Cardiac enlargement with mild pulmonary vascular congestion, developing since previous study. Mild perihilar infiltration suggesting early edema. Small right pleural effusion. No pneumothorax. Calcified and tortuous aorta.  IMPRESSION: Shallow inspiration with linear atelectasis in the mid and lower lungs. Cardiac enlargement with mild pulmonary vascular congestion, mild perihilar edema, and small right pleural effusion.   Electronically Signed   By: Burman Nieves M.D.   On: 05/26/2015 00:06      Recent Lab Findings: Lab Results  Component Value Date   WBC 15.8* 05/26/2015   HGB 7.7* 05/26/2015   HCT 23.3* 05/26/2015   PLT 227 05/26/2015   GLUCOSE 414* 05/26/2015   ALT 120* 08/16/2013   AST 71* 08/16/2013   NA 130* 05/26/2015   K 4.7 05/26/2015   CL 88* 05/26/2015   CREATININE 7.51* 05/26/2015   BUN 53* 05/26/2015   CO2 26 05/26/2015   TSH 0.583 05/26/2015   INR 1.41 05/26/2015   HGBA1C 9.3* 10/24/2012   Echo:  Study Conclusions  - Left ventricle: The  cavity size was normal. Wall thickness was increased in a pattern of mild LVH. Systolic function was moderately to severely reduced. The estimated ejection fraction was in the range of 30% to 35%. There is severe hypokinesis of the entireinferior myocardium. Doppler parameters are consistent with high ventricular filling pressure. - Mitral valve: There was mild regurgitation.    Cardiac catheterization: Conclusion     Prox RCA-1 lesion, 80% stenosed.  Prox RCA-2 lesion, 90% stenosed.  Ost Cx to Prox Cx lesion, 99% stenosed.  Prox Cx to Mid Cx lesion, 25% stenosed. The lesion was previously treated with a stent (unknown type).  Ost LAD to Prox LAD lesion, 70% stenosed.  1st Diag lesion, 25% stenosed.  Prox LAD to Mid LAD lesion, 50% stenosed.  Ost 2nd Diag lesion, 90% stenosed.  There is moderate to severe left ventricular systolic dysfunction.  1. Severe triple vessel CAD with severe ostial Circumflex stenosis, hazy moderate to severe proximal LAD stenosis, severe proximal RCA stenosis.  2. Moderate mid LAD stenosis with filling defect suggestive of thrombus. 3. Moderately severe LV systolic dysfunction 4. Diabetes mellitus with ESRD on HD 5. Unstable angina/NSTEMI  Recommendations: He has complex multi-vessel CAD  with stenosis involving the ostial LAD and ostial Circumflex as well as the proximal RCA. There is severe calcification of the RCA and LAD. He is an insulin dependent diabetic on ESRD. While he is not an optimal candidate for CABG, I believe surgical revascularization would be the best option. The distal targets appear suitable for bypass. PCI would be very high risk. Stenting of the ostial Circumflex could potentially jeopardize the flow into the LAD. Bifurcational stenting of the distal Left main into the LAD and Circumflex would be high risk for restenosis. Rotational atherectomy would be required for PCI of the LAD and RCA. Will consult CT surgery to  discuss CABG. Resume heparin drip 8 hours post sheath pull.        Assessment / Plan:   The patient is a 64 year old male with multiple co-morbidities who presents at this time with a NSTEMI.  Catheterization showed severe 3 vessel coronary artery disease not felt to be amenable to percutaneous intervention.  He would best be served by coronary artery bypass grafting.  He will tentatively be scheduled for Friday, 10/14. Dr. Donata Clay will see the patient and review his films.     COLLINS,GINA H 05/26/2015 4:53 PM

## 2015-05-26 NOTE — Interval H&P Note (Signed)
History and Physical Interval Note:  05/26/2015 10:13 AM  Daniel Anthony  has presented today for cardiac cath with the diagnosis of NSTEMI/CAD. The various methods of treatment have been discussed with the patient and family. After consideration of risks, benefits and other options for treatment, the patient has consented to  Procedure(s): Left Heart Cath and Coronary Angiography (N/A) as a surgical intervention .  The patient's history has been reviewed, patient examined, no change in status, stable for surgery.  I have reviewed the patient's chart and labs.  Questions were answered to the patient's satisfaction.    Cath Lab Visit (complete for each Cath Lab visit)  Clinical Evaluation Leading to the Procedure:   ACS: Yes.    Non-ACS:    Anginal Classification: CCS IV  Anti-ischemic medical therapy: Maximal Therapy (2 or more classes of medications)  Non-Invasive Test Results: No non-invasive testing performed  Prior CABG: No previous CABG         MCALHANY,CHRISTOPHER

## 2015-05-26 NOTE — Progress Notes (Signed)
  Echocardiogram 2D Echocardiogram has been performed.  Daniel Anthony 05/26/2015, 3:17 PM

## 2015-05-26 NOTE — H&P (Signed)
Name: Daniel Anthony MRN: [id number] DOB: 1951/01/11    ADMISSION DATE:  05/26/2015 CONSULTATION DATE:  05/26/2015  REFERRING MD :  [facility]  CHIEF COMPLAINT:  Non-STEMI  BRIEF PATIENT DESCRIPTION: 64 year old Caucasian male with known history of coronary artery disease and prior stents. Transferred from outside hospital for non-ST elevation MI.  SIGNIFICANT EVENTS  10/10 - Admit from OSH  STUDIES:  EKG 10/10 - sinus rhythm with ST elevation in lead III & AVR  HISTORY OF PRESENT ILLNESS:  Caucasian male with known history of coronary artery disease. Patient also has known underlying diabetes and end-stage renal disease. He reports anterior chest discomfort with radiation to his right jaw each previously he had been contributing to esophageal spasm due to timing with oral intake. Patient reports that his chest discomfort has improved significantly since earlier this evening and his jaw discomfort he now rates as 3/10. He denies any prior syncope or near syncope except for yesterday after hemodialysis when he became unresponsive. He was transported to outside hospital emergency room and was noted to have bradycardia into the 30s transiently. The patient has also noted that his blood pressure has been significantly better controlled without the use of medications that have been required in the past. This is been a noted change in the last couple weeks and one that is unexplained. He denies any current nausea or vomiting. He denies any abdominal pain. No melena or hematochezia. He denies any subjective fever, chills, or sweats. He denies any dyspnea or cough. Patient was started on a heparin drip and transported to our hospital for further evaluation by cardiology.  PAST MEDICAL HISTORY :  Past Medical History  Diagnosis Date  . Bradycardia     Hx of  . Pleural effusion, right   . Hypomagnesemia   . Hypertension   . Diabetes mellitus   . Hyperlipidemia   . Coronary artery disease   .  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 MI (myocardial infarction) (HCC) 1995    anterior  . CIDP (chronic inflammatory demyelinating polyneuropathy) (HCC)   . Polyneuropathy in diabetes(357.2) 12/13/2012  . Abnormality of gait   . Foot drop, bilateral 05/10/2013  . Kidney disease   . End-stage renal disease (HCC)   . Dialysis patient [facility])    PAST SURGICAL HISTORY: Past Surgical History  Procedure Laterality Date  . Tonsillectomy    . Cardiac catheterization  1995    2 stents   . Coronary angioplasty with stent placement  1995  . Lithotripsy      Prior to Admission medications   Medication Sig Start Date End Date Taking? Authorizing Provider  amLODipine (NORVASC) 10 MG tablet Take 10 mg by mouth daily.  10/27/12   Vassie Loll, MD  aspirin 325 MG tablet Take 325 mg by mouth daily.    Historical Provider, MD  calcitRIOL (ROCALTROL) 0.25 MCG capsule Take 0.25 mcg by mouth 2 (two) times daily.     Historical Provider, MD  calcium acetate (PHOSLO) 667 MG capsule Take 667 mg by mouth 3 (three) times daily with meals.    Historical Provider, MD  carvedilol (COREG) 25 MG tablet Take 25 mg by mouth daily.     Historical Provider, MD  cinacalcet (SENSIPAR) 30 MG tablet Take 30 mg by mouth daily.    Historical Provider, MD  docusate sodium (COLACE) 250 MG capsule Take 1 capsule (250 mg total) by mouth 2 (two) times daily. 10/27/12  Vassie Loll, MD  escitalopram (LEXAPRO) 20 MG tablet TAKE 1 TABLET BY MOUTH EVERY DAY 02/19/15   Maple Hudson., MD  fentaNYL (DURAGESIC - DOSED MCG/HR) 75 MCG/HR Place 1 patch (75 mcg total) onto the skin every 3 (three) days. Patient taking differently: Place 100 mcg onto the skin every 3 (three) days.  03/17/15   Richard Hulen Shouts., MD  insulin detemir (LEVEMIR) 100 UNIT/ML injection Inject 42 Units into the skin at bedtime.     Historical Provider, MD  LEVEMIR FLEXTOUCH 100 UNIT/ML Pen INJECT 45 UNITS UNDER THE SKIN EVERY  NIGHT AT BEDTIME Patient taking differently: 42 units 01/20/15   Richard Hulen Shouts., MD  lidocaine-prilocaine (EMLA) cream Apply 2.5 application topically as needed. 08/21/13   Historical Provider, MD  LORazepam (ATIVAN) 0.5 MG tablet Take 0.5 mg by mouth 2 (two) times daily as needed for anxiety.    Historical Provider, MD  multivitamin (RENA-VIT) TABS tablet Take 1 tablet by mouth daily.    Historical Provider, MD  nitroGLYCERIN (NITROSTAT) 0.4 MG SL tablet Place 1 tablet (0.4 mg total) under the tongue every 5 (five) minutes as needed for chest pain. Patient not taking: Reported on 05/25/2015 01/21/15   Maple Hudson., MD  NOVOLOG FLEXPEN 100 UNIT/ML FlexPen Inject 14 Units into the skin 3 (three) times daily with meals. 12 units three times daily before meals 06/03/14   Historical Provider, MD  polyethylene glycol (MIRALAX / GLYCOLAX) packet Take 17 g by mouth 3 (three) times a week.    Historical Provider, MD  rosuvastatin (CRESTOR) 40 MG tablet Take 1 tablet (40 mg total) by mouth 3 (three) times a week. 07/16/13   Antonieta Iba, MD  tamsulosin (FLOMAX) 0.4 MG CAPS capsule TAKE 1 CAPSULE BY MOUTH EVERY DAY 02/19/15   Maple Hudson., MD   Allergies  Allergen Reactions  . Zosyn [Piperacillin Sod-Tazobactam So] Itching and Rash    FAMILY HISTORY:  Family History  Problem Relation Age of Onset  . Heart attack Father   . Hypertension Father   . Diabetes Father   . Cancer Father     prostate  . Hypertension Mother   . Cancer Maternal Grandmother     colon   SOCIAL HISTORY: Social History   Social History  . Marital Status: Married    Spouse Name: Clydie Braun  . Number of Children: 1  . Years of Education: college   Occupational History  .      RetiredSystems developer   Social History Main Topics  . Smoking status: Former Smoker    Types: Cigarettes    Quit date: 08/17/1979  . Smokeless tobacco: Never Used  . Alcohol Use: No  . Drug Use: No  . Sexual Activity: Not on file     Other Topics Concern  . Not on file   Social History Narrative   ** Merged History Encounter **    Patient lives at home with his wife Clydie Braun). Patient is retired. Patient has college education.   Right handed.   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  REVIEW OF SYSTEMS:  No rashes or abnormal bruising. No lymphadenopathy in his neck, groin, or axilla. A pertinent 14 point review of systems is negative except as per the history of presenting illness.  SUBJECTIVE:  VITAL SIGNS: Temp:  [98.1 F (36.7 C)-98.5 F (36.9 C)] 98.5 F (36.9 C) (10/10 0349) Pulse Rate:  [39-106] 91 (10/10 0430) Resp:  [10-24] 17 (10/10 0430) BP: (79-125)/(42-72) 103/65 mmHg (10/10 0430) SpO2:  [92 %-100 %] 100 % (10/10 0430) Weight:  [180 lb (81.647 kg)-185 lb 3 oz (84 kg)] 185 lb 3 oz (84 kg) (10/10 0245)  PHYSICAL EXAMINATION: General:  Awake. Alert. No acute distress. Laying in bed in the dark watching TV.  Integument:  Warm & dry. No rash on exposed skin. No bruising. Lymphatics:  No appreciated cervical or supraclavicular lymphadenoapthy. HEENT:  Moist mucus membranes. No oral ulcers. No scleral injection. PERRL. Cardiovascular:  Regular rate. No edema. No appreciable JVD.  Pulmonary:  Good aeration & clear to auscultation bilaterally. Symmetric chest wall rise. No accessory muscle use. Abdomen: Soft. Normal bowel sounds. Protuberant. Grossly nontender. Musculoskeletal:  Normal bulk and tone. Hand grip strength 5/5 bilaterally. No joint deformity or effusion appreciated. Neurological:  CN 2-12 grossly in tact. No meningismus. Moving all 4 extremities equally.  Psychiatric:  Mood and affect congruent. Speech normal rhythm, rate & tone.    Recent Labs Lab 05/25/15 2225  NA 129*  K 4.9  CL 89*  CO2 22  BUN 54*  CREATININE 7.20*  GLUCOSE  688*    Recent Labs Lab 05/25/15 2225  HGB 8.1*  HCT 24.9*  WBC 9.4  PLT 183   Dg Chest 1 View  05/26/2015   CLINICAL DATA:  Possible syncopal episode with bradycardia and brief episode of unresponsiveness. Hyperglycemia.  EXAM: CHEST 1 VIEW  COMPARISON:  08/16/2013  FINDINGS: Shallow inspiration with atelectasis in the lung bases. Cardiac enlargement with mild pulmonary vascular congestion, developing since previous study. Mild perihilar infiltration suggesting early edema. Small right pleural effusion. No pneumothorax. Calcified and tortuous aorta.  IMPRESSION: Shallow inspiration with linear atelectasis in the mid and lower lungs. Cardiac enlargement with mild pulmonary vascular congestion, mild perihilar edema, and small right pleural effusion.   Electronically Signed   By: Burman Nieves M.D.   On: 05/26/2015 00:06    ASSESSMENT / PLAN: 64 year old male with known diabetes mellitus, end-stage renal disease on hemodialysis, & coronary artery disease presenting with non-ST elevation MI. He has been evaluated by cardiology since his transport to our facility. Patient has been continued on IV heparin infusion. His blood pressure at this time seems to be stable. Also concern is the patient's seemingly new anemia without evidence of active bleeding or blood loss. Certainly the anemia could be contributing. Patient did self administer his insulin earlier this evening with improvement in his hyperglycemia.  1. Non-STEMI: Cardiology consulted & Dr. Mayford Knife has seen. Plan for left heart catheterization. Continuing patient on heparin infusion & aspirin. 2. Insulin-dependent diabetes mellitus: Restarting patient's home Levemir. Initiating low-dose sliding scale algorithm with Accu-Cheks every 4 hours given patient's end-stage renal disease. 3. Anemia: Confirmed with the patient that this is likely secondary to his renal failure & an ongoing issue. Per cardiology's request I have ordered a stool  Hemoccult. Serum vitamin B12, ferritin, iron, TIBC, & folate RBC already ordered. Plan to trend with daily CBC. 4. Syncope: Likely due to bradycardia & possible hypertension. Monitoring patient on telemetry. Cardiology plans to place Zol. 5. Hypotension: Holding ANTIHYPERTENSIVE regimen given syncope. 6. End-stage renal disease: Plan to consult nephrology after patient's left heart catheterization. Continuing vitamin, PhosLo, & calcitriol once patient able to take by mouth. 7. Diabetic gastroparesis: Plan to resume patient's home regimen  once he is no longer nothing by mouth. 8. CIDP: Continuing Lexapro. Plan to reinitiate remainder of home regimen once he is able to take by mouth. 9. Prophylaxis: Systemic anticoagulation with heparin. 10. Diet: Nothing by mouth pending procedure. 11. CODE STATUS: Patient is full code per my discussion with him this morning.  I have spent a total of 33 minutes of critical care time today caring for this patient, discussing the plan of care with the patient, & reviewing the patient's electronic medical record.  Donna Christen [name], M.D. [facility] Pager:  [phone] After 3pm or if no response, call [phone] 05/26/2015, 4:38 AM

## 2015-05-26 NOTE — Progress Notes (Signed)
SUBJECTIVE:  No chest pain.  No SOB   PHYSICAL EXAM Filed Vitals:   05/26/15 0645 05/26/15 0700 05/26/15 0715 05/26/15 0800  BP: 85/54 95/59  100/55  Pulse: 75 77 75 74  Temp:    98.3 F (36.8 C)  TempSrc:    Oral  Resp: Height:      Weight:      SpO2: 100% 100% 100% 100%   General:  No distress Lungs:  Clear Heart:  RRR Abdomen:  Positive bowel sounds, no rebound no guarding Extremities:  No edema  LABS: Lab Results  Component Value Date   TROPONINI 9.25* 05/26/2015   Results for orders placed or performed during the hospital encounter of 05/26/15 (from the past 24 hour(s))  MRSA PCR Screening     Status: None   Collection Time: 05/26/15  3:39 AM  Result Value Ref Range   MRSA by PCR NEGATIVE NEGATIVE  Troponin I (q 6hr x 3)     Status: Abnormal   Collection Time: 05/26/15  5:08 AM  Result Value Ref Range   Troponin I 9.25 (HH) <0.031 ng/mL  Basic metabolic panel     Status: Abnormal   Collection Time: 05/26/15  5:08 AM  Result Value Ref Range   Sodium 130 (L) 135 - 145 mmol/L   Potassium 4.7 3.5 - 5.1 mmol/L   Chloride 88 (L) 101 - 111 mmol/L   CO2 26 22 - 32 mmol/L   Glucose, Bld 414 (H) 65 - 99 mg/dL   BUN 53 (H) 6 - 20 mg/dL   Creatinine, Ser 4.09 (H) 0.61 - 1.24 mg/dL   Calcium 7.9 (L) 8.9 - 10.3 mg/dL   GFR calc non Af Amer 7 (L) >60 mL/min   GFR calc Af Amer 8 (L) >60 mL/min   Anion gap 16 (H) 5 - 15  CBC     Status: Abnormal   Collection Time: 05/26/15  5:08 AM  Result Value Ref Range   WBC 15.8 (H) 4.0 - 10.5 K/uL   RBC 2.51 (L) 4.22 - 5.81 MIL/uL   Hemoglobin 7.7 (L) 13.0 - 17.0 g/dL   HCT 81.1 (L) 91.4 - 78.2 %   MCV 92.8 78.0 - 100.0 fL   MCH 30.7 26.0 - 34.0 pg   MCHC 33.0 30.0 - 36.0 g/dL   RDW 95.6 21.3 - 08.6 %   Platelets 227 150 - 400 K/uL  Protime-INR     Status: Abnormal   Collection Time: 05/26/15  5:08 AM  Result Value Ref Range   Prothrombin Time 17.4 (H) 11.6 - 15.2 seconds   INR 1.41 0.00 - 1.49  Vitamin  B12     Status: Abnormal   Collection Time: 05/26/15  5:08 AM  Result Value Ref Range   Vitamin B-12 1043 (H) 180 - 914 pg/mL  Ferritin     Status: Abnormal   Collection Time: 05/26/15  5:08 AM  Result Value Ref Range   Ferritin 2292 (H) 24 - 336 ng/mL  Iron and TIBC     Status: Abnormal   Collection Time: 05/26/15  5:08 AM  Result Value Ref Range   Iron 27 (L) 45 - 182 ug/dL   TIBC 578 (L) 469 - 629 ug/dL   Saturation Ratios 11 (L) 17.9 - 39.5 %   UIBC 215 ug/dL  TSH     Status: None   Collection Time: 05/26/15  5:08 AM  Result Value Ref Range  TSH 0.583 0.350 - 4.500 uIU/mL    Intake/Output Summary (Last 24 hours) at 05/26/15 0824 Last data filed at 05/26/15 0800  Gross per 24 hour  Intake     55 ml  Output      0 ml  Net     55 ml    EKG:  NSR, rate 87, minimal ST elevation in the inferior leads with high lateral ST depression.  Less pronounced this AM.    ASSESSMENT AND PLAN:  NSTEMI:   Cath today.  The patient understands that risks included but are not limited to stroke (1 in 1000), death (1 in 1000), kidney failure [usually temporary] (1 in 500), bleeding (1 in 200), allergic reaction [possibly serious] (1 in 200).  The patient understands and agrees to proceed.   SYNCOPE/BRADYCARDIA:  Beta blocker discontinued.    HYPOTENSION:   BP is running low.  No bradycardia off beta blocker.  Also off amlodipine and BP is running low.  No clear etiology.   Check echo to rule out pericardial effusion with hypotension and cardiomegaly on CXR.   ANEMIA:  Likely secondary to renal failure and anemia of chronic disease.  There has been no evidence of active bleeding.   Iron is low. Stool guaiac is pending.    Not patient does home stool guaiacs which are negative.  He has not done one in several weeks.    [facility] 05/26/2015 8:24 AM

## 2015-05-26 NOTE — Progress Notes (Signed)
CRITICAL VALUE ALERT  Critical value received:  Troponin 9.25  Date of notification:  05/26/2015  Time of notification:  0610  Critical value read back:Yes.    Nurse who received alert:  Ernie Hew  MD notified (1st page):  Leron Croak  Time of first page:  [phone]  MD notified (2nd page):  Time of second page:  Responding MD:  Leron Croak  Time MD responded:  [phone]

## 2015-05-26 NOTE — Consult Note (Signed)
Daniel Anthony 05/26/2015 Daniel Anthony Requesting Physician:  Daniel Neighbor MD  Reason for Consult:  ESRD, Chest Pain HPI:  2M ESRD on home hemodialysis admitted with recurrent, stuttering chest pain and found to have non-STEMI. Patient underwent left heart catheterization today demonstrating diffuse multivessel disease with question if he would benefit from CABG. Other pertinent past medical history includes diabetes, CIDP.  Found to have worsened anemia as well.      Filed Weights   05/26/15 0245  Weight: 84 kg (185 lb 3 oz)    I/O last 3 completed shifts: In: 35 [I.V.:35] Out: -   ROS Balance of 12 systems is negative w/ exceptions as above  Outpt HD Orders Unit: Home HD -- K Machine Days: MWF Time: 4h Dialyzer: F180 EDW: 79kg K/Ca: 2/2.25 Access: AVF LUE BFR/DFR: 400/A2 UF Proflie: none VDRA: PO Calcitriol 0.92mcg daily EPO: Mircera Most Recent Phos / PTH: 6.2 / 549 Most Recent TSAT / Ferritin: 53/935 Treatment Adherence: Good  PMH  Past Medical History  Diagnosis Date  . Bradycardia     Hx of  . Pleural effusion, right   . Hypomagnesemia   . Hypertension   . Diabetes mellitus   . Hyperlipidemia   . Coronary artery disease   . 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 MI (myocardial infarction) (HCC) 1995    anterior  . CIDP (chronic inflammatory demyelinating polyneuropathy) (HCC)   . Polyneuropathy in diabetes(357.2) 12/13/2012  . Abnormality of gait   . Foot drop, bilateral 05/10/2013  . Kidney disease   . End-stage renal disease (HCC)   . Dialysis patient [facility])    PSH  Past Surgical History  Procedure Laterality Date  . Tonsillectomy    . Cardiac catheterization  1995    2 stents   . Coronary angioplasty with stent placement  1995  . Lithotripsy     FH  Family History  Problem Relation Age of Onset  . Heart attack Father   . Hypertension Father   . Diabetes Father   . Cancer Father     prostate  .  Hypertension Mother   . Cancer Maternal Grandmother     colon   SH  reports that he quit smoking about 35 years ago. His smoking use included Cigarettes. He has never used smokeless tobacco. He reports that he does not drink alcohol or use illicit drugs. Allergies  Allergies  Allergen Reactions  . Zosyn [Piperacillin Sod-Tazobactam So] Itching and Rash   Home medications Prior to Admission medications   Medication Sig Start Date End Date Taking? Authorizing Provider  amLODipine (NORVASC) 10 MG tablet Take 10 mg by mouth daily.  10/27/12   Vassie Loll, MD  aspirin 325 MG tablet Take 325 mg by mouth daily.    Historical Provider, MD  calcitRIOL (ROCALTROL) 0.25 MCG capsule Take 0.25 mcg by mouth 2 (two) times daily.     Historical Provider, MD  calcium acetate (PHOSLO) 667 MG capsule Take 667 mg by mouth 3 (three) times daily with meals.    Historical Provider, MD  carvedilol (COREG) 25 MG tablet Take 25 mg by mouth daily.     Historical Provider, MD  cinacalcet (SENSIPAR) 30 MG tablet Take 30 mg by mouth daily.    Historical Provider, MD  docusate sodium (COLACE) 250 MG capsule Take 1 capsule (250 mg total) by mouth 2 (two) times daily. 10/27/12   Vassie Loll, MD  escitalopram (LEXAPRO) 20 MG  tablet TAKE 1 TABLET BY MOUTH EVERY DAY 02/19/15   Maple Hudson., MD  fentaNYL (DURAGESIC - DOSED MCG/HR) 75 MCG/HR Place 1 patch (75 mcg total) onto the skin every 3 (three) days. Patient taking differently: Place 100 mcg onto the skin every 3 (three) days.  03/17/15   Richard Hulen Shouts., MD  insulin detemir (LEVEMIR) 100 UNIT/ML injection Inject 42 Units into the skin at bedtime.     Historical Provider, MD  LEVEMIR FLEXTOUCH 100 UNIT/ML Pen INJECT 45 UNITS UNDER THE SKIN EVERY NIGHT AT BEDTIME Patient taking differently: 42 units 01/20/15   Richard Hulen Shouts., MD  lidocaine-prilocaine (EMLA) cream Apply 2.5 application topically as needed. 08/21/13   Historical Provider, MD  LORazepam  (ATIVAN) 0.5 MG tablet Take 0.5 mg by mouth 2 (two) times daily as needed for anxiety.    Historical Provider, MD  multivitamin (RENA-VIT) TABS tablet Take 1 tablet by mouth daily.    Historical Provider, MD  nitroGLYCERIN (NITROSTAT) 0.4 MG SL tablet Place 1 tablet (0.4 mg total) under the tongue every 5 (five) minutes as needed for chest pain. Patient not taking: Reported on 05/25/2015 01/21/15   Maple Hudson., MD  NOVOLOG FLEXPEN 100 UNIT/ML FlexPen Inject 14 Units into the skin 3 (three) times daily with meals. 12 units three times daily before meals 06/03/14   Historical Provider, MD  polyethylene glycol (MIRALAX / GLYCOLAX) packet Take 17 g by mouth 3 (three) times a week.    Historical Provider, MD  rosuvastatin (CRESTOR) 40 MG tablet Take 1 tablet (40 mg total) by mouth 3 (three) times a week. 07/16/13   Antonieta Iba, MD  tamsulosin (FLOMAX) 0.4 MG CAPS capsule TAKE 1 CAPSULE BY MOUTH EVERY DAY 02/19/15   Maple Hudson., MD    Current Medications Scheduled Meds: . [START ON 05/27/2015] aspirin  81 mg Oral Pre-Cath  . calcitRIOL  0.25 mcg Oral BID  . calcium acetate  667 mg Oral TID WC  . escitalopram  20 mg Oral Daily  . insulin aspart  0-9 Units Subcutaneous 6 times per day  . insulin detemir  42 Units Subcutaneous QHS  . multivitamin  1 tablet Oral Daily  . sodium chloride  3 mL Intravenous Q12H  . tamsulosin  0.4 mg Oral Daily   Continuous Infusions: . sodium chloride    . heparin 1,000 Units/hr (05/26/15 0800)   PRN Meds:.sodium chloride, sodium chloride, fentaNYL, midazolam, sodium chloride  CBC  Recent Labs Lab 05/25/15 2225 05/26/15 0508  WBC 9.4 15.8*  HGB 8.1* 7.7*  HCT 24.9* 23.3*  MCV 95.5 92.8  PLT 183 227   Basic Metabolic Panel  Recent Labs Lab 05/25/15 2225 05/26/15 0508  NA 129* 130*  K 4.9 4.7  CL 89* 88*  CO2 22 26  GLUCOSE 688* 414*  BUN 54* 53*  CREATININE 7.20* 7.51*  CALCIUM 7.9* 7.9*    Physical Exam  Blood pressure  97/53, pulse 80, temperature 98.3 F (36.8 C), temperature source Oral, resp. rate 14, height 5\' 8"  (1.727 m), weight 84 kg (185 lb 3 oz), SpO2 99 %. GEN: NAD ENT: NCAT EYES: EOMI CV: RRR no rub PULM: CTAB ABD: s/nt/nd SKIN: no rashes/lesions EXT:No LEE LFA AVF -- Faint B/T   A/P 1. ESRD:  1. MWF home hemo 2. Next HD 10/11 off schedule, follow daily and work around procedural/surgical plans 3. No heparin in system, on systemic heparin 4. Keep EDW here 5. AVF  w/ weak B/T, wife cannulates at home, would like her to if possible 2. ACS NSTEMI 1. LHC 10/10 with MV disease 2. TCTS eval pending 3. Hep Gtt 3. HTN/Vol: as above off BP Meds 4. Anemia:  1. On ESA as outpt, need to clarify timing 2. Resume 3. Transfuse prn 4. Low TSAT very high gferritin hold on IV Fe 05/26/15 5. MBD: 1. Cont outpt meds of PhosLo and Calcitriol  Sabra Heck MD 05/26/2015, 11:04 AM

## 2015-05-26 NOTE — Progress Notes (Signed)
eLink Physician-Brief Progress Note Patient Name: Daniel Anthony DOB: September 16, 1950 MRN: [id number]   Date of Service  05/26/2015  HPI/Events of Note  Anxious  eICU Interventions  POativanordered     Intervention Category Major Interventions: Other:  Heinz Eckert 05/26/2015, 3:33 PM

## 2015-05-26 NOTE — ED Provider Notes (Signed)
Time Seen: Approximately 2215  I have reviewed the triage notes  Chief Complaint: Hyperglycemia   History of Present Illness: Daniel Anthony is a 64 y.o. male who presents with concerns over hyperglycemia. Patient is on home insulin therapy and describes hypoglycemia and taking his insulin prior to arrival at 9:30. He states he took 42 units of NovoLog and 12 units of Humulin regular. The patient's also one Levemir. The patient states that he's been somewhat intermittently taking his insulin has a history of labile blood sugar. 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 The patient states he's been terribly diagnosed with trigeminal neuralgia with his right-sided jaw pain which is been occurring now over the past year but the pain more intense over the last 3 days. She initially denied any chest pain but then went on to state later that he's been having some intermittent discomfort substernally which she thought was reflux. He denies any persistent chest pain. Patient's a dialysis patient 3 times a week and does his dialysis at home. He states that he is not having any shortness of breath, nausea, vomiting. He denies any neck or back discomfort.   Past Medical History  Diagnosis Date  . Bradycardia     Hx of  . Pleural effusion, right   . Hypomagnesemia   . Hypertension   . Diabetes mellitus   . Hyperlipidemia   . Coronary artery disease   . 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 MI (myocardial infarction) (HCC) 1995    anterior  . CIDP (chronic inflammatory demyelinating polyneuropathy) (HCC)   . Polyneuropathy in diabetes(357.2) 12/13/2012  . Abnormality of gait   . Foot drop, bilateral 05/10/2013  . Kidney disease   . End-stage renal disease (HCC)   . Dialysis patient [facility])     Patient Active Problem List   Diagnosis Date Noted  . Radiculopathy with lower extremity symptoms 10/14/2014  . Orthostatic hypotension  02/07/2014  . Fever 08/16/2013  . Altered mental status 08/16/2013  . ESRD (end stage renal disease) (HCC) 08/16/2013  . Leukocytosis 08/16/2013  . Elevated liver function tests 08/16/2013  . Viral intestinal infection 08/16/2013  . HTN (hypertension) 07/23/2013  . Abnormality of gait 05/10/2013  . Foot drop, bilateral 05/10/2013  . Diabetic polyneuropathy (HCC) 12/13/2012  . Intractable vomiting 10/24/2012  . CIDP (chronic inflammatory demyelinating polyneuropathy) (HCC) 10/24/2012  . Throat tightness 12/23/2011  . SOB (shortness of breath) 12/22/2010  . DM (diabetes mellitus), type 2, uncontrolled, with renal complications (HCC) 12/22/2010  . Hyperlipemia 12/22/2010  . CAD (coronary artery disease) 12/22/2010    Past Surgical History  Procedure Laterality Date  . Tonsillectomy    . Cardiac catheterization  1995    2 stents   . Coronary angioplasty with stent placement  1995  . Lithotripsy      Past Surgical History  Procedure Laterality Date  . Tonsillectomy    . Cardiac catheterization  1995    2 stents   . Coronary angioplasty with stent placement  1995  . Lithotripsy      Current Outpatient Rx  Name  Route  Sig  Dispense  Refill  . amLODipine (NORVASC) 10 MG tablet   Oral   Take 10 mg by mouth daily.          Marland Kitchen aspirin 325 MG tablet   Oral   Take 325 mg by mouth daily.         Marland Kitchen  calcitRIOL (ROCALTROL) 0.25 MCG capsule   Oral   Take 0.25 mcg by mouth 2 (two) times daily.          . calcium acetate (PHOSLO) 667 MG capsule   Oral   Take 667 mg by mouth 3 (three) times daily with meals.         . carvedilol (COREG) 25 MG tablet   Oral   Take 25 mg by mouth daily.          . cinacalcet (SENSIPAR) 30 MG tablet   Oral   Take 30 mg by mouth daily.         Marland Kitchen docusate sodium (COLACE) 250 MG capsule   Oral   Take 1 capsule (250 mg total) by mouth 2 (two) times daily.   60 capsule   1   . escitalopram (LEXAPRO) 20 MG tablet      TAKE 1  TABLET BY MOUTH EVERY DAY   90 tablet   0   . fentaNYL (DURAGESIC - DOSED MCG/HR) 75 MCG/HR   Transdermal   Place 1 patch (75 mcg total) onto the skin every 3 (three) days. Patient taking differently: Place 100 mcg onto the skin every 3 (three) days.    10 patch   0   . insulin detemir (LEVEMIR) 100 UNIT/ML injection   Subcutaneous   Inject 42 Units into the skin at bedtime.          . lidocaine-prilocaine (EMLA) cream   Topical   Apply 2.5 application topically as needed.         Marland Kitchen LORazepam (ATIVAN) 0.5 MG tablet   Oral   Take 0.5 mg by mouth 2 (two) times daily as needed for anxiety.         . multivitamin (RENA-VIT) TABS tablet   Oral   Take 1 tablet by mouth daily.         Marland Kitchen NOVOLOG FLEXPEN 100 UNIT/ML FlexPen   Subcutaneous   Inject 14 Units into the skin 3 (three) times daily with meals. 12 units three times daily before meals      12     Dispense as written.   . polyethylene glycol (MIRALAX / GLYCOLAX) packet   Oral   Take 17 g by mouth 3 (three) times a week.         . rosuvastatin (CRESTOR) 40 MG tablet   Oral   Take 1 tablet (40 mg total) by mouth 3 (three) times a week.   30 tablet   1   . tamsulosin (FLOMAX) 0.4 MG CAPS capsule      TAKE 1 CAPSULE BY MOUTH EVERY DAY   90 capsule   3   . LEVEMIR FLEXTOUCH 100 UNIT/ML Pen      INJECT 45 UNITS UNDER THE SKIN EVERY NIGHT AT BEDTIME Patient taking differently: 42 units   15 mL   12   . nitroGLYCERIN (NITROSTAT) 0.4 MG SL tablet   Sublingual   Place 1 tablet (0.4 mg total) under the tongue every 5 (five) minutes as needed for chest pain. Patient not taking: Reported on 05/25/2015   50 tablet   12     Allergies:  Zosyn  Family History: Family History  Problem Relation Age of Onset  . Heart attack Father   . Hypertension Father   . Diabetes Father   . Cancer Father     prostate  . Hypertension Mother   . Cancer Maternal Grandmother     colon  Social History: Social  History  Substance Use Topics  . Smoking status: Former Smoker    Types: Cigarettes    Quit date: 08/17/1979  . Smokeless tobacco: Never Used  . Alcohol Use: No     Review of Systems:   10 point review of systems was performed and was otherwise negative:  Constitutional: No fever Eyes: No visual disturbances ENT: No sore throat, ear pain Cardiac: Occasional substernal burning discomfort transient in nature without radiation Respiratory: No shortness of breath, wheezing, or stridor Abdomen: No abdominal pain, no vomiting, No diarrhea Endocrine: No weight loss, No night sweats Extremities: No peripheral edema, cyanosis Skin: No rashes, easy bruising Neurologic: No new focal weakness, trouble with speech or swollowing Urologic: Patient has a history of renal failure with minimal urine output   Physical Exam:  ED Triage Vitals  Enc Vitals Group     BP 05/25/15 2207 125/66 mmHg     Pulse Rate 05/25/15 2207 100     Resp 05/25/15 2207 16     Temp 05/25/15 2207 98.1 F (36.7 C)     Temp Source 05/25/15 2207 Oral     SpO2 05/25/15 2207 98 %     Weight 05/25/15 2257 180 lb (81.647 kg)     Height 05/25/15 2257  (1.753 m)     Head Cir --      Peak Flow --      Pain Score 05/25/15 2214 8     Pain Loc --      Pain Edu? --      Excl. in GC? --     General: Awake , Alert , and Oriented times 3; GCS 15 Head: Normal cephalic , atraumatic Eyes: Pupils equal , round, reactive to light Nose/Throat: No nasal drainage, patent upper airway without erythema or exudate.  Neck: Supple, Full range of motion, No anterior adenopathy or palpable thyroid masses Lungs: Diminished breath sounds bilaterally to bases without wheezes, rhonchi, rales Heart: Regular rate, regular rhythm without murmurs , gallops , or rubs Abdomen: Soft, non tender without rebound, guarding , or rigidity; bowel sounds positive and symmetric in all 4 quadrants. No organomegaly .        Extremities: 2 plus  symmetric pulses. No edema, clubbing or cyanosis Neurologic, Motor symmetric without deficits, sensory intact Skin: warm, dry, no rashes   Labs:   All laboratory work was reviewed including any pertinent negatives or positives listed below:  Labs Reviewed  BASIC METABOLIC PANEL - Abnormal; Notable for the following:    Sodium 129 (*)    Chloride 89 (*)    Glucose, Bld 688 (*)    BUN 54 (*)    Creatinine, Ser 7.20 (*)    Calcium 7.9 (*)    GFR calc non Af Amer 7 (*)    GFR calc Af Amer 8 (*)    Anion gap 18 (*)    All other components within normal limits  CBC - Abnormal; Notable for the following:    RBC 2.61 (*)    Hemoglobin 8.1 (*)    HCT 24.9 (*)    RDW 15.4 (*)    All other components within normal limits  TROPONIN I - Abnormal; Notable for the following:    Troponin I 1.31 (*)    All other components within normal limits  GLUCOSE, CAPILLARY - Abnormal; Notable for the following:    Glucose-Capillary >600 (*)    All other components within normal limits  GLUCOSE, CAPILLARY -  Abnormal; Notable for the following:    Glucose-Capillary 516 (*)    All other components within normal limits  CBG MONITORING, ED   abnormalities noted on laboratory work were hyperglycemia, elevated troponin, renal failure. Anemia.  EKG:  ED ECG REPORT I, Jennye Moccasin, the attending physician, personally viewed and interpreted this ECG.  Date: 05/26/2015 EKG Time: 2239 Rate: 101 Rhythm: Sinus tachycardia QRS Axis: normal Intervals: normal ST/T Wave abnormalities: ST depression noticed in leads V1 and some slight elevation in leads 3 and possibly aVF the artifact present Conduction Disutrbances: none Narrative Interpretation: Concern for acute non-ST wave elevation MI No old EKGs available for comparison Poor R-wave progression in the anterior leads  EKG #2 ED ECG REPORT I, Jennye Moccasin, the attending physician, personally viewed and interpreted this ECG.  Date:  05/26/2015 EKG Time: 2305 Rate: 99 Rhythm: normal sinus rhythm QRS Axis: normal Intervals: normal ST/T Wave abnormalities: ST elevation 1 mm noticed in lead 3 No ST elevation in leads 2 and aVF 1 mm depression noticed in lead 1 Ischemic changes concerning for non-ST wave elevation MI conduction Disutrbances: none Narrative Interpretation: unremarkable   Radiology:   I personally reviewed the radiologic studiesEXAM: CHEST 1 VIEW  COMPARISON: 08/16/2013  FINDINGS: Shallow inspiration with atelectasis in the lung bases. Cardiac enlargement with mild pulmonary vascular congestion, developing since previous study. Mild perihilar infiltration suggesting early edema. Small right pleural effusion. No pneumothorax. Calcified and tortuous aorta.  IMPRESSION: Shallow inspiration with linear atelectasis in the mid and lower lungs. Cardiac enlargement with mild pulmonary vascular congestion, mild perihilar edema, and small right pleural effusion.    Critical Care:  CRITICAL CARE Performed by: Jennye Moccasin   Total critical care time: 63 minutes  Critical care time was exclusive of separately billable procedures and treating other patients.  Critical care was necessary to treat or prevent imminent or life-threatening deterioration.  Critical care was time spent personally by me on the following activities: development of treatment plan with patient and/or surrogate as well as nursing, discussions with consultants, evaluation of patient's response to treatment, examination of patient, obtaining history from patient or surrogate, ordering and performing treatments and interventions, ordering and review of laboratory studies, ordering and review of radiographic studies, pulse oximetry and re-evaluation of patient's condition. Management of acute non-ST-T wave myocardial infarction    ED Course: The patient initially presented with hyperglycemia. He had taken insulin at home at  9:30 and I was hesitant to treat the patient aggressively for his hyperglycemia. The patient due to his elevated blood sugar had initial cardiac screen which showed EKGs concerning for acute ischemia. The patient was given a heparin bolus and then eventually started on a heparin drip. Consultation was established with the [facility] cardiology fellow agreed that the patient did not fit STEMI criteria. The patient was consulted with cardiology at [facility] Dr. Mayford Knife who felt the patient may have elevated troponin due to his history of renal failure and dialysis and that the patient did not require intervention at this time and to plan transfer with critical care at [facility] due to the patient's multiple medical issues. Phone consultation was arranged with the intensivist at [facility] and patient was accepted in transfer. The patient does not appear to require immediate dialysis. The patient had episodic events here in emergency department where his heart rate did decrease into the low 30s. We could not capture it on EKG consistent with episodes were very transient and the patient  had brief loss of consciousness but was able to be aroused. His blood pressure would drop into the high 70s. He will quickly returned into the mid 90s and his blood pressure was stabilized at systolic pressures ranging from 90-100. We cannot Sure whether or not this was a sinus bradycardia or was some form of block.  The patient was given a heparin bolus and started on a heparin drip. Thus far the patient is tolerating his interventions well he also received Toradol and IV Dilaudid for pain.   Assessment:  Non-ST-T wave elevation myocardial infarction History of renal failure History of anemia Hyperglycemia Episodic bradycardia with associated syncope    Final Clinical Impression  Final diagnoses:  NSTEMI (non-ST elevated myocardial infarction) [facility])     Plan:  Transferred to [facility] for intensive care unit  management.           Jennye Moccasin, MD 05/26/15 [phone]

## 2015-05-26 NOTE — ED Notes (Signed)
Pt reports no change in pain level from previous assessment.

## 2015-05-26 NOTE — Progress Notes (Signed)
Site area: FRA Site Prior to Removal:  Level 0 Pressure Applied For:63min Manual:  yes  Patient Status During Pull: stable  Post Pull Site:  Level 0 Post Pull Instructions Given:  yes Post Pull Pulses Present: doppler Dressing Applied:  clear Bedrest begins @ 1145 till 1545 Comments:held by Harriette Ohara

## 2015-05-26 NOTE — ED Notes (Signed)
Pt placed on oxygen at 1lpm via [place]  

## 2015-05-26 NOTE — ED Notes (Signed)
Pt states "my chest pain is about gone, the jaw is still about a four." pt updated on transfer progress.

## 2015-05-26 NOTE — Progress Notes (Signed)
ANTICOAGULATION CONSULT NOTE - Initial Consult  Pharmacy Consult for heparin Indication: chest pain/ACS  Allergies  Allergen Reactions  . Zosyn [Piperacillin Sod-Tazobactam So] Itching and Rash    Patient Measurements: Height:  (172.7 cm) Weight: 185 lb 3 oz (84 kg) IBW/kg (Calculated) : 68.4 Heparin Dosing Weight: 84kg  Vital Signs: Temp: 98.5 F (36.9 C) (10/10 0349) Temp Source: Oral (10/10 0349) BP: 99/57 mmHg (10/10 0415) Pulse Rate: 90 (10/10 0415)  Labs:  Recent Labs  05/25/15 2225  HGB 8.1*  HCT 24.9*  PLT 183  CREATININE 7.20*  TROPONINI 1.31*    Estimated Creatinine Clearance: 10.9 mL/min (by C-G formula based on Cr of 7.2).   Medical History: Past Medical History  Diagnosis Date  . Bradycardia     Hx of  . Pleural effusion, right   . Hypomagnesemia   . Hypertension   . Diabetes mellitus   . Hyperlipidemia   . Coronary artery disease   . 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 MI (myocardial infarction) (HCC) 1995    anterior  . CIDP (chronic inflammatory demyelinating polyneuropathy) (HCC)   . Polyneuropathy in diabetes(357.2) 12/13/2012  . Abnormality of gait   . Foot drop, bilateral 05/10/2013  . Kidney disease   . End-stage renal disease (HCC)   . Dialysis patient [facility])     Medications:  Infusions:  . [START ON 05/27/2015] sodium chloride    . heparin      Assessment: 64 yom initially presented to [facility] with hyperglycemia. Developed CP and jaw pain and was found to have an elevated troponin. Started on heparin at [facility] with a 4000 unit bolus and rate of 1000 units/hr. H/H low and platelets are WNL.   Goal of Therapy:  Heparin level 0.3-0.7 units/ml Monitor platelets by anticoagulation protocol: Yes   Plan:  - Continue heparin gtt at 1000 units/hr as started at [facility] - Check an 8 hour heparin level - Daily heparin level and CBC  Azure Barrales, Drake Leach 05/26/2015,4:25 AM

## 2015-05-26 NOTE — Consult Note (Signed)
Admit date: 05/26/2015 Referring Physician  Dr. [name] Primary Cardiologist  Dr. Julien Nordmann Reason for Consultation  Chest pain  HPI: Mr. 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  Echocardiogram February 2012 was essentially normal. There was no mention on whether there was diastolic dysfunction. Study was read by the outside cardiologist.  Recently over the past few weeks he has been having episodic right jaw pain and chest discomfort.  Over the past 2 weeks this has become more frequent occurring during the night and after eating of drinking.  He does have diabetic gastroparesis and takes reglan and Nexium but says that that has been under good control.  He initially thought he had trigeminal neuralgia which would usually subside with warm heat but recently it has not improved with this and has been occurring with chest discomfort.  He is very inactive due to his demyelinating disease so difficult to discern if there is an exertional component.  He used to have problems with his BP being very high, but recently he has had issues with it running low in the 90's and has had to hold his Coreg and amlodipine on occasion.    According to his wife, while he was on HD yesterday he had an episode where he became unresponsive and slumped over.  His wife shook him and he opened his eyes but they appeared glazed over and she called  911.  This episode lasted about 2 minutes.  He was transported to [facility] ER where he had another episode of unresponsiveness with staring and was noted to have a heart rate drop into the 30's transiently.  Since transfer to [facility] his HR has been ok.    In ER he was noted to have an elevated troponin, worsening creatinine, Hbg of 8 and markedly elevated BS at 688.   Chest xray showed mild volume overload.  Troponin was elevated at 1.31 and EKG demonstrated  NSR with ST depression in I, aVL, V4-V6 which is new and minimal ST elevation in III.  Cardiology is now asked to consult for further evaluation of CP and elevated troponin.        PMH:   Past Medical History  Diagnosis Date  . Bradycardia     Hx of  . Pleural effusion, right   . Hypomagnesemia   . Hypertension   . Diabetes mellitus   . Hyperlipidemia   . Coronary artery disease   . 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 MI (myocardial infarction) (HCC) 1995    anterior  . CIDP (chronic inflammatory demyelinating polyneuropathy) (HCC)   . Polyneuropathy in diabetes(357.2) 12/13/2012  . Abnormality of gait   . Foot drop, bilateral 05/10/2013  . Kidney disease   . End-stage renal disease (HCC)   . Dialysis patient [facility])      PSH:   Past Surgical History  Procedure Laterality Date  . Tonsillectomy    . Cardiac catheterization  1995  2 stents   . Coronary angioplasty with stent placement  1995  . Lithotripsy      Allergies:  Zosyn Prior to Admit Meds:   Prescriptions prior to admission  Medication Sig Dispense Refill Last Dose  . amLODipine (NORVASC) 10 MG tablet Take 10 mg by mouth daily.    Past Week at Unknown time  . aspirin 325 MG tablet Take 325 mg by mouth daily.   05/25/2015 at Unknown time  . calcitRIOL (ROCALTROL) 0.25 MCG capsule Take 0.25 mcg by mouth 2 (two) times daily.    05/25/2015 at Unknown time  . calcium acetate (PHOSLO) 667 MG capsule Take 667 mg by mouth 3 (three) times  daily with meals.   05/25/2015 at Unknown time  . carvedilol (COREG) 25 MG tablet Take 25 mg by mouth daily.    Past Week at Unknown time  . cinacalcet (SENSIPAR) 30 MG tablet Take 30 mg by mouth daily.   05/25/2015 at Unknown time  . docusate sodium (COLACE) 250 MG capsule Take 1 capsule (250 mg total) by mouth 2 (two) times daily. 60 capsule 1 05/25/2015 at Unknown time  . escitalopram (LEXAPRO) 20 MG tablet TAKE 1 TABLET BY MOUTH EVERY DAY 90 tablet 0 05/25/2015 at Unknown time  . fentaNYL (DURAGESIC - DOSED MCG/HR) 75 MCG/HR Place 1 patch (75 mcg total) onto the skin every 3 (three) days. (Patient taking differently: Place 100 mcg onto the skin every 3 (three) days. ) 10 patch 0 05/24/2015 at Unknown time  . insulin detemir (LEVEMIR) 100 UNIT/ML injection Inject 42 Units into the skin at bedtime.    05/25/2015 at Unknown time  . LEVEMIR FLEXTOUCH 100 UNIT/ML Pen INJECT 45 UNITS UNDER THE SKIN EVERY NIGHT AT BEDTIME (Patient taking differently: 42 units) 15 mL 12 Taking  . lidocaine-prilocaine (EMLA) cream Apply 2.5 application topically as needed.   Past Week at Unknown time  . LORazepam (ATIVAN) 0.5 MG tablet Take 0.5 mg by mouth 2 (two) times daily as needed for anxiety.   05/25/2015 at Unknown time  . multivitamin (RENA-VIT) TABS tablet Take 1 tablet by mouth daily.   05/25/2015 at Unknown time  . nitroGLYCERIN (NITROSTAT) 0.4 MG SL tablet Place 1 tablet (0.4 mg total) under the tongue every 5 (five) minutes as needed for chest pain. (Patient not taking: Reported on 05/25/2015) 50 tablet 12 Not Taking at Unknown time  . NOVOLOG FLEXPEN 100 UNIT/ML FlexPen Inject 14 Units into the skin 3 (three) times daily with meals. 12 units three times daily before meals  12 Past Week at Unknown time  . polyethylene glycol (MIRALAX / GLYCOLAX) packet Take 17 g by mouth 3 (three) times a week.   05/25/2015 at Unknown time  . rosuvastatin (CRESTOR) 40 MG tablet Take 1 tablet (40 mg total) by mouth 3 (three) times a week.  30 tablet 1 05/24/2015 at Unknown time  . tamsulosin (FLOMAX) 0.4 MG CAPS capsule TAKE 1 CAPSULE BY MOUTH EVERY DAY 90 capsule 3 05/25/2015 at Unknown time   Fam HX:    Family History  Problem Relation Age of Onset  . Heart attack Father   . Hypertension Father   . Diabetes Father   . Cancer Father     prostate  . Hypertension Mother   . Cancer Maternal Grandmother     colon   Social HX:    Social History   Social History  . Marital Status: Married    Spouse Name: Clydie Braun  . Number of  Children: 1  . Years of Education: college   Occupational History  .      RetiredSystems developer   Social History Main Topics  . Smoking status: Former Smoker    Types: Cigarettes    Quit date: 08/17/1979  . Smokeless tobacco: Never Used  . Alcohol Use: No  . Drug Use: No  . Sexual Activity: Not on file   Other Topics Concern  . Not on file   Social History Narrative   ** Merged History Encounter **    Patient lives at home with his wife Clydie Braun). Patient is retired. Patient has college education.   Right handed.   Caffeine- 1 cup daily           ROS:  All 11 ROS were addressed and are negative except what is stated in the HPI  Physical Exam: Blood pressure 90/54, pulse 88, temperature 98.5 F (36.9 C), temperature source Oral, resp. rate 17, height 5\' 8"  (1.727 m), weight 185 lb 3 oz (84 kg), SpO2 99 %.    General: Well developed, well nourished, in no acute distress Head: Eyes PERRLA, No xanthomas.   Normal cephalic and atramatic  Lungs:   Clear bilaterally to auscultation and percussion. Heart:   HRRR S1 S2 Pulses are 2+ & equal.            No carotid bruit. No JVD.  No abdominal bruits. No femoral bruits. Abdomen: Bowel sounds are positive, abdomen soft and non-tender without masses  Extremities:   No clubbing, cyanosis or edema.  DP +1 Neuro: Alert and oriented X 3. Psych:  Good affect, responds appropriately    Labs:   Lab Results  Component Value Date   WBC 9.4  05/25/2015   HGB 8.1* 05/25/2015   HCT 24.9* 05/25/2015   MCV 95.5 05/25/2015   PLT 183 05/25/2015    Recent Labs Lab 05/25/15 2225  NA 129*  K 4.9  CL 89*  CO2 22  BUN 54*  CREATININE 7.20*  CALCIUM 7.9*  GLUCOSE 688*   No results found for: PTT No results found for: INR, PROTIME Lab Results  Component Value Date   CKTOTAL 77 05/17/2013   TROPONINI 1.31* 05/25/2015    No results found for: CHOL No results found for: HDL No results found for: LDLCALC No results found for: TRIG No results found for: CHOLHDL No results found for: LDLDIRECT    Radiology:  Dg Chest 1 View  05/26/2015   CLINICAL DATA:  Possible syncopal episode with bradycardia and brief episode of unresponsiveness. Hyperglycemia.  EXAM: CHEST 1 VIEW  COMPARISON:  08/16/2013  FINDINGS: Shallow inspiration with atelectasis in the lung bases. Cardiac enlargement with mild pulmonary vascular congestion, developing since previous study. Mild perihilar infiltration suggesting early edema. Small right pleural effusion. No pneumothorax. Calcified and tortuous aorta.  IMPRESSION: Shallow inspiration with linear atelectasis in the mid and lower lungs. Cardiac enlargement with mild pulmonary vascular congestion, mild perihilar edema, and small right pleural effusion.   Electronically Signed   By: Burman Nieves M.D.   On: 05/26/2015 00:06    EKG:  NSR with 0.5-35mm of ST elevation isolated to III with ST depression in I, aVL, V4-V6.  ASSESSMENT/PLAN: 1.  NSTEMI with elevated troponin and ischemic changes on EKG.  I have discussed case and EKG reviewed by Interventionalist on call, Dr. Katrinka Blazing.  EKG does not meet STEMI criteria.  His symptoms are worrisome for coronary ischemia and with new EKG changes this is most  consistent with acute coronary syndrome and not demand ischeima.  Although, he does have a new low Hgb so could consider demand ischemia.  Will make NPO for now and place on cath board but need to get BS under  control before cath.  Repeat Hbg in am.  He is currently on IV Heparin from .  Hold BB given episodes of ? Syncope with transient bradycardia.  Decrease ASA to  daily.   2.  ESRD on home HD - renal consult in am.   3.  Anemia ? Etiology.  Most likely chronic disease in setting of ESRD - last Hbg was 2015 that was normal.  Also need to consider GI etiology with known h/o GERD.  Heme check stools.  Check iron panel, RBC folate and B12. 4.  DM with poorly controlled BS - per CCM 5.  ASCAD with remote anterior MI and PCI of LAD and LCx with cath 2006 with patent stents and nuclear stress test 02/2014 with no ischemia.   6.  Transient LOC with documented HR transiently in the 30's in ER.  Will place Zol.  D/C Coreg.  Continue Tele monitoring.  CHeck TSH 7.  Hypotension - he has ha problems with low BP recently.  Will hold antihypertensive meds for now.   Quintella Reichert, MD  05/26/2015  4:12 AM

## 2015-05-26 NOTE — H&P (View-Only) (Signed)
  SUBJECTIVE:  No chest pain.  No SOB   PHYSICAL EXAM Filed Vitals:   05/26/15 0645 05/26/15 0700 05/26/15 0715 05/26/15 0800  BP: 85/54 95/59  100/55  Pulse: 75 77 75 74  Temp:    98.3 F (36.8 C)  TempSrc:    Oral  Resp: 14 15 16 15  Height:      Weight:      SpO2: 100% 100% 100% 100%   General:  No distress Lungs:  Clear Heart:  RRR Abdomen:  Positive bowel sounds, no rebound no guarding Extremities:  No edema  LABS: Lab Results  Component Value Date   TROPONINI 9.25* 05/26/2015   Results for orders placed or performed during the hospital encounter of 05/26/15 (from the past 24 hour(s))  MRSA PCR Screening     Status: None   Collection Time: 05/26/15  3:39 AM  Result Value Ref Range   MRSA by PCR NEGATIVE NEGATIVE  Troponin I (q 6hr x 3)     Status: Abnormal   Collection Time: 05/26/15  5:08 AM  Result Value Ref Range   Troponin I 9.25 (HH) <0.031 ng/mL  Basic metabolic panel     Status: Abnormal   Collection Time: 05/26/15  5:08 AM  Result Value Ref Range   Sodium 130 (L) 135 - 145 mmol/L   Potassium 4.7 3.5 - 5.1 mmol/L   Chloride 88 (L) 101 - 111 mmol/L   CO2 26 22 - 32 mmol/L   Glucose, Bld 414 (H) 65 - 99 mg/dL   BUN 53 (H) 6 - 20 mg/dL   Creatinine, Ser 7.51 (H) 0.61 - 1.24 mg/dL   Calcium 7.9 (L) 8.9 - 10.3 mg/dL   GFR calc non Af Amer 7 (L) >60 mL/min   GFR calc Af Amer 8 (L) >60 mL/min   Anion gap 16 (H) 5 - 15  CBC     Status: Abnormal   Collection Time: 05/26/15  5:08 AM  Result Value Ref Range   WBC 15.8 (H) 4.0 - 10.5 K/uL   RBC 2.51 (L) 4.22 - 5.81 MIL/uL   Hemoglobin 7.7 (L) 13.0 - 17.0 g/dL   HCT 23.3 (L) 39.0 - 52.0 %   MCV 92.8 78.0 - 100.0 fL   MCH 30.7 26.0 - 34.0 pg   MCHC 33.0 30.0 - 36.0 g/dL   RDW 14.9 11.5 - 15.5 %   Platelets 227 150 - 400 K/uL  Protime-INR     Status: Abnormal   Collection Time: 05/26/15  5:08 AM  Result Value Ref Range   Prothrombin Time 17.4 (H) 11.6 - 15.2 seconds   INR 1.41 0.00 - 1.49  Vitamin  B12     Status: Abnormal   Collection Time: 05/26/15  5:08 AM  Result Value Ref Range   Vitamin B-12 1043 (H) 180 - 914 pg/mL  Ferritin     Status: Abnormal   Collection Time: 05/26/15  5:08 AM  Result Value Ref Range   Ferritin 2292 (H) 24 - 336 ng/mL  Iron and TIBC     Status: Abnormal   Collection Time: 05/26/15  5:08 AM  Result Value Ref Range   Iron 27 (L) 45 - 182 ug/dL   TIBC 242 (L) 250 - 450 ug/dL   Saturation Ratios 11 (L) 17.9 - 39.5 %   UIBC 215 ug/dL  TSH     Status: None   Collection Time: 05/26/15  5:08 AM  Result Value Ref Range     TSH 0.583 0.350 - 4.500 uIU/mL    Intake/Output Summary (Last 24 hours) at 05/26/15 0824 Last data filed at 05/26/15 0800  Gross per 24 hour  Intake     55 ml  Output      0 ml  Net     55 ml    EKG:  NSR, rate 87, minimal ST elevation in the inferior leads with high lateral ST depression.  Less pronounced this AM.    ASSESSMENT AND PLAN:  NSTEMI:   Cath today.  The patient understands that risks included but are not limited to stroke (1 in 1000), death (1 in 1000), kidney failure [usually temporary] (1 in 500), bleeding (1 in 200), allergic reaction [possibly serious] (1 in 200).  The patient understands and agrees to proceed.   SYNCOPE/BRADYCARDIA:  Beta blocker discontinued.    HYPOTENSION:   BP is running low.  No bradycardia off beta blocker.  Also off amlodipine and BP is running low.  No clear etiology.   Check echo to rule out pericardial effusion with hypotension and cardiomegaly on CXR.   ANEMIA:  Likely secondary to renal failure and anemia of chronic disease.  There has been no evidence of active bleeding.   Iron is low. Stool guaiac is pending.    Not patient does home stool guaiacs which are negative.  He has not done one in several weeks.    [facility] 05/26/2015 8:24 AM

## 2015-05-26 NOTE — ED Notes (Signed)
Pt states chest pain 1/10 and jaw pain 4/10 currently. Pt informed carelink transport service is here. Pt verbalizes understanding.

## 2015-05-26 NOTE — ED Notes (Signed)
Pt states has 4/10 jaw pain and 1/10 chest pain after dilaudid administration. Family remains at bedside. Pt updated on transfer progress.

## 2015-05-26 NOTE — ED Notes (Signed)
Pt provided with ice chips with consent of dr. Joie Bimler.

## 2015-05-27 ENCOUNTER — Ambulatory Visit (HOSPITAL_COMMUNITY): Payer: Medicare Other

## 2015-05-27 ENCOUNTER — Encounter (HOSPITAL_COMMUNITY): Payer: Self-pay | Admitting: Cardiovascular Disease

## 2015-05-27 DIAGNOSIS — Z01810 Encounter for preprocedural cardiovascular examination: Secondary | ICD-10-CM

## 2015-05-27 DIAGNOSIS — D638 Anemia in other chronic diseases classified elsewhere: Secondary | ICD-10-CM

## 2015-05-27 DIAGNOSIS — I255 Ischemic cardiomyopathy: Secondary | ICD-10-CM | POA: Diagnosis present

## 2015-05-27 DIAGNOSIS — G8929 Other chronic pain: Secondary | ICD-10-CM

## 2015-05-27 LAB — GLUCOSE, CAPILLARY
GLUCOSE-CAPILLARY: 60 mg/dL — AB (ref 65–99)
GLUCOSE-CAPILLARY: 74 mg/dL (ref 65–99)
Glucose-Capillary: 172 mg/dL — ABNORMAL HIGH (ref 65–99)
Glucose-Capillary: 192 mg/dL — ABNORMAL HIGH (ref 65–99)
Glucose-Capillary: 219 mg/dL — ABNORMAL HIGH (ref 65–99)
Glucose-Capillary: 55 mg/dL — ABNORMAL LOW (ref 65–99)
Glucose-Capillary: 73 mg/dL (ref 65–99)
Glucose-Capillary: 80 mg/dL (ref 65–99)

## 2015-05-27 LAB — COMPREHENSIVE METABOLIC PANEL
ALBUMIN: 2.5 g/dL — AB (ref 3.5–5.0)
ALT: 187 U/L — ABNORMAL HIGH (ref 17–63)
ANION GAP: 17 — AB (ref 5–15)
AST: 186 U/L — AB (ref 15–41)
Alkaline Phosphatase: 284 U/L — ABNORMAL HIGH (ref 38–126)
BILIRUBIN TOTAL: 3.6 mg/dL — AB (ref 0.3–1.2)
BUN: 59 mg/dL — AB (ref 6–20)
CHLORIDE: 90 mmol/L — AB (ref 101–111)
CO2: 23 mmol/L (ref 22–32)
Calcium: 7.7 mg/dL — ABNORMAL LOW (ref 8.9–10.3)
Creatinine, Ser: 8.57 mg/dL — ABNORMAL HIGH (ref 0.61–1.24)
GFR calc Af Amer: 7 mL/min — ABNORMAL LOW (ref >60)
GFR calc non Af Amer: 6 mL/min — ABNORMAL LOW (ref >60)
GLUCOSE: 70 mg/dL (ref 65–99)
POTASSIUM: 4.4 mmol/L (ref 3.5–5.1)
SODIUM: 130 mmol/L — AB (ref 135–145)
TOTAL PROTEIN: 6.3 g/dL — AB (ref 6.5–8.1)

## 2015-05-27 LAB — CBC WITH DIFFERENTIAL/PLATELET
BASOS ABS: 0 10*3/uL (ref 0.0–0.1)
BASOS PCT: 0 %
EOS ABS: 0.1 10*3/uL (ref 0.0–0.7)
EOS PCT: 1 %
HCT: 22.8 % — ABNORMAL LOW (ref 39.0–52.0)
Hemoglobin: 7.6 g/dL — ABNORMAL LOW (ref 13.0–17.0)
LYMPHS ABS: 0.5 10*3/uL — AB (ref 0.7–4.0)
Lymphocytes Relative: 5 %
MCH: 30.5 pg (ref 26.0–34.0)
MCHC: 33.3 g/dL (ref 30.0–36.0)
MCV: 91.6 fL (ref 78.0–100.0)
Monocytes Absolute: 0.8 10*3/uL (ref 0.1–1.0)
Monocytes Relative: 8 %
NEUTROS PCT: 86 %
Neutro Abs: 8.8 10*3/uL — ABNORMAL HIGH (ref 1.7–7.7)
PLATELETS: 203 10*3/uL (ref 150–400)
RBC: 2.49 MIL/uL — ABNORMAL LOW (ref 4.22–5.81)
RDW: 15.3 % (ref 11.5–15.5)
WBC: 10.3 10*3/uL (ref 4.0–10.5)

## 2015-05-27 LAB — ABO/RH: ABO/RH(D): A POS

## 2015-05-27 LAB — FOLATE RBC
FOLATE, HEMOLYSATE: 547.6 ng/mL
FOLATE, RBC: 2391 ng/mL (ref >498)
Hematocrit: 22.9 % — ABNORMAL LOW (ref 37.5–51.0)

## 2015-05-27 LAB — HEPARIN LEVEL (UNFRACTIONATED)
HEPARIN UNFRACTIONATED: 0.2 [IU]/mL — AB (ref 0.30–0.70)
Heparin Unfractionated: 1.07 IU/mL — ABNORMAL HIGH (ref 0.30–0.70)

## 2015-05-27 LAB — PREPARE RBC (CROSSMATCH)

## 2015-05-27 LAB — MAGNESIUM: MAGNESIUM: 2.5 mg/dL — AB (ref 1.7–2.4)

## 2015-05-27 MED ORDER — LIDOCAINE-PRILOCAINE 2.5-2.5 % EX CREA
1.0000 "application " | TOPICAL_CREAM | CUTANEOUS | Status: DC | PRN
  Filled 2015-05-27: qty 5

## 2015-05-27 MED ORDER — DARBEPOETIN ALFA 200 MCG/0.4ML IJ SOSY
200.0000 ug | PREFILLED_SYRINGE | INTRAMUSCULAR | Status: DC
  Administered 2015-05-27 – 2015-06-03 (×2): 200 ug via INTRAVENOUS
  Filled 2015-05-27 (×2): qty 0.4

## 2015-05-27 MED ORDER — TAMSULOSIN HCL 0.4 MG PO CAPS
0.4000 mg | ORAL_CAPSULE | Freq: Every day | ORAL | Status: DC
  Administered 2015-05-27 – 2015-06-08 (×12): 0.4 mg via ORAL
  Filled 2015-05-27 (×14): qty 1

## 2015-05-27 MED ORDER — DARBEPOETIN ALFA 200 MCG/0.4ML IJ SOSY
PREFILLED_SYRINGE | INTRAMUSCULAR | Status: AC
  Filled 2015-05-27: qty 0.4

## 2015-05-27 MED ORDER — SODIUM CHLORIDE 0.9 % IV SOLN: Freq: Once | INTRAVENOUS | Status: DC

## 2015-05-27 MED ORDER — SODIUM CHLORIDE 0.9 % IV SOLN: 100.0000 mL | INTRAVENOUS | Status: DC | PRN

## 2015-05-27 MED ORDER — ACETAMINOPHEN 325 MG PO TABS
650.0000 mg | ORAL_TABLET | ORAL | Status: AC | PRN
  Administered 2015-05-27: 650 mg via ORAL

## 2015-05-27 MED ORDER — HEPARIN SODIUM (PORCINE) 1000 UNIT/ML DIALYSIS: 1000.0000 [IU] | INTRAMUSCULAR | Status: DC | PRN

## 2015-05-27 MED ORDER — ACETAMINOPHEN 325 MG PO TABS
ORAL_TABLET | ORAL | Status: AC
  Filled 2015-05-27: qty 2

## 2015-05-27 MED ORDER — LIDOCAINE HCL (PF) 1 % IJ SOLN: 5.0000 mL | INTRAMUSCULAR | Status: DC | PRN

## 2015-05-27 MED ORDER — RENA-VITE PO TABS
1.0000 | ORAL_TABLET | Freq: Every day | ORAL | Status: DC
  Administered 2015-05-27 – 2015-06-08 (×12): 1 via ORAL
  Filled 2015-05-27 (×14): qty 1

## 2015-05-27 MED ORDER — ESCITALOPRAM OXALATE 20 MG PO TABS
20.0000 mg | ORAL_TABLET | Freq: Every day | ORAL | Status: DC
  Administered 2015-05-27 – 2015-06-08 (×12): 20 mg via ORAL
  Filled 2015-05-27 (×14): qty 1

## 2015-05-27 MED ORDER — PENTAFLUOROPROP-TETRAFLUOROETH EX AERO: 1.0000 "application " | INHALATION_SPRAY | CUTANEOUS | Status: DC | PRN

## 2015-05-27 MED ORDER — INSULIN DETEMIR 100 UNIT/ML ~~LOC~~ SOLN
25.0000 [IU] | Freq: Every day | SUBCUTANEOUS | Status: DC
  Administered 2015-05-27 – 2015-05-29 (×3): 25 [IU] via SUBCUTANEOUS
  Filled 2015-05-27 (×4): qty 0.25

## 2015-05-27 NOTE — Clinical Documentation Improvement (Signed)
Cardiology  Abnormal Lab/Test Results:  Na+'s running 129, 130, 130  Possible Clinical Conditions associated with below indicators  Hypernatremia  Hyponatremia  SIADH  Other Condition  Cannot Clinically Determine  Supporting Information:  Treatment Provided:  0.9% NaCl infusing  Please exercise your independent, professional judgment when responding. A specific answer is not anticipated or expected.  Thank You,  Shellee Milo BSN Health Information Management [place] [phone]

## 2015-05-27 NOTE — Progress Notes (Addendum)
VASCULAR LAB PRELIMINARY  PRELIMINARY  PRELIMINARY  PRELIMINARY  Pre-op Cardiac Surgery  Carotid Findings:  Bilateral:  1-39% ICA stenosis.  Vertebral artery flow is antegrade.      Thereasa Parkin, RVT 05/27/2015 2:06 PM    Upper Extremity Right Left  Brachial Pressures 182  Triphasic  HD access  Radial Waveforms    Ulnar Waveforms    Palmar Arch (Allen's Test) Within normal limits      Lower  Extremity Right Left  Dorsalis Pedis 255  Triphasic  255  Triphasic   Anterior Tibial    Posterior Tibial 255  Triphasic  255  Triphasic   Ankle/Brachial Indices Non compressible Non compressible    Thereasa Parkin, RVT 05/28/2015 10:16 AM     Bilateral Lower Extremity Vein Map    Right Great Saphenous Vein   Segment Diameter Comment  1. Origin 4.92 mm   2. High Thigh 3.2 mm   3. Mid Thigh 4.43 mm branch  4. Low Thigh 3.33 mm   5. At Knee 2.97 mm   6. High Calf 3.79 mm Multiple branches in calf  7. Low Calf 3.1 mm   8. Ankle 3.33 mm    mm    mm    mm     Left Great Saphenous Vein   Segment Diameter Comment  1. Origin 3.15 mm   2. High Thigh 2.51 mm   3. Mid Thigh 4.38 mm Branches in thigh greater than right  4. Low Thigh 3.1 mm   5. At Knee 3.35 mm   6. High Calf 3.1 mm Multiple branches in calf (greater than right)  7. Low Calf 3.1 mm   8. Ankle 2.51 mm    mm    mm    mm    Thereasa Parkin, RVT 05/28/2015 11:15 AM

## 2015-05-27 NOTE — Clinical Documentation Improvement (Signed)
Critical Care  Can the diagnosis of "hypotension" be further specified. Please document findings in next progress note and include in discharge summary if applicable.   Other  Clinically Undetermined  Supporting Information:  BP's running 81/47 with Map of 55, 79/50 with Map of 58; HR > 90 at times  Fluid boluses hung  NSTEMI  Please exercise your independent, professional judgment when responding. A specific answer is not anticipated or expected.  Thank You, Shellee Milo BSN, RN Health Information Management [place] [phone]

## 2015-05-27 NOTE — Progress Notes (Signed)
Inpatient Diabetes Program Recommendations  AACE/ADA: New Consensus Statement on Inpatient Glycemic Control (2015)  Target Ranges:  Prepandial:   less than 140 mg/dL      Peak postprandial:   less than 180 mg/dL (1-2 hours)      Critically ill patients:  140 - 180 mg/dL   Review of Glycemic Control:  Results for EFSTATHIOS, SAWIN (MRN [id number]) as of 05/27/2015 14:04  Ref. Range 05/27/2015 00:08 05/27/2015 04:09 05/27/2015 04:38 05/27/2015 12:25 05/27/2015 12:48  Glucose-Capillary Latest Ref Range: 65-99 mg/dL 74 55 (L) 73 60 (L) 80     Outpatient Diabetes medications: Levemir 42 units q HS, Novolog 12-14 units with lunch Current orders for Inpatient glycemic control:  Levemir 42 units daily, Novolog sensitive q 4 hours.  Inpatient Diabetes Program Recommendations:    Called and spoke with PA regarding hypoglycemia.  Recommended decrease of Levemir to 25 units q HS.  Orders received.   Thanks, Beryl Meager, RN, BC-ADM Inpatient Diabetes Coordinator Pager [phone] (8a-5p)

## 2015-05-27 NOTE — Progress Notes (Signed)
Patient completed HD treatment. Patient tolerated tx well. 3kg of fluid has been removed. Patient was ordered 1 unit of PRBCs for a hgb of 7.6. Treatment was extended for patient to receive one unit of blood. Patient has declined to have blood given on HD due to discomfort. Patient wishes to receive his blood while in his room where he is able to sit comfortably.

## 2015-05-27 NOTE — Procedures (Signed)
I was present at this dialysis session. I have reviewed the session itself and made appropriate changes.   UF goal 3L.  Tolerating well.  AVF working well. Give aranesp 200 qWk, starting today.  No role for IV Fe here.  Await surgical plan.    Sabra Heck  MD 05/27/2015, 8:36 AM

## 2015-05-27 NOTE — Progress Notes (Signed)
SUBJECTIVE:  No chest pain.  No SOB   PHYSICAL EXAM Filed Vitals:   05/27/15 0829 05/27/15 0845 05/27/15 0900 05/27/15 0930  BP: 120/43 118/57 104/58 106/59  Pulse: 95 93 85 84  Temp: 98.7 F (37.1 C)     TempSrc: Oral     Resp: Height:      Weight: 181 lb 7 oz (82.3 kg)     SpO2: 94% 94% 96% 95%   General:  No distress Lungs:  Clear Heart:  RRR Abdomen:  Positive bowel sounds, no rebound no guarding Extremities:  No edema, right femoral without bleeding or bruising.    LABS: Lab Results  Component Value Date   TROPONINI 9.25* 05/26/2015   Results for orders placed or performed during the hospital encounter of 05/26/15 (from the past 24 hour(s))  Glucose, capillary     Status: Abnormal   Collection Time: 05/26/15 11:24 AM  Result Value Ref Range   Glucose-Capillary 180 (H) 65 - 99 mg/dL  Glucose, capillary     Status: Abnormal   Collection Time: 05/26/15 12:12 PM  Result Value Ref Range   Glucose-Capillary 178 (H) 65 - 99 mg/dL   Comment 1 Notify RN   Glucose, capillary     Status: Abnormal   Collection Time: 05/26/15  3:20 PM  Result Value Ref Range   Glucose-Capillary 128 (H) 65 - 99 mg/dL  Glucose, capillary     Status: None   Collection Time: 05/26/15  7:53 PM  Result Value Ref Range   Glucose-Capillary 79 65 - 99 mg/dL   Comment 1 Notify RN    Comment 2 Document in Chart   Glucose, capillary     Status: None   Collection Time: 05/27/15 12:08 AM  Result Value Ref Range   Glucose-Capillary 74 65 - 99 mg/dL   Comment 1 Notify RN    Comment 2 Document in Chart   Heparin level (unfractionated)     Status: Abnormal   Collection Time: 05/27/15  2:22 AM  Result Value Ref Range   Heparin Unfractionated 0.20 (L) 0.30 - 0.70 IU/mL  Comprehensive metabolic panel     Status: Abnormal   Collection Time: 05/27/15  2:22 AM  Result Value Ref Range   Sodium 130 (L) 135 - 145 mmol/L   Potassium 4.4 3.5 - 5.1 mmol/L   Chloride 90 (L) 101 - 111 mmol/L     CO2 23 22 - 32 mmol/L   Glucose, Bld 70 65 - 99 mg/dL   BUN 59 (H) 6 - 20 mg/dL   Creatinine, Ser 9.60 (H) 0.61 - 1.24 mg/dL   Calcium 7.7 (L) 8.9 - 10.3 mg/dL   Total Protein 6.3 (L) 6.5 - 8.1 g/dL   Albumin 2.5 (L) 3.5 - 5.0 g/dL   AST 454 (H) 15 - 41 U/L   ALT 187 (H) 17 - 63 U/L   Alkaline Phosphatase 284 (H) 38 - 126 U/L   Total Bilirubin 3.6 (H) 0.3 - 1.2 mg/dL   GFR calc non Af Amer 6 (L) >60 mL/min   GFR calc Af Amer 7 (L) >60 mL/min   Anion gap 17 (H) 5 - 15  CBC with Differential/Platelet     Status: Abnormal   Collection Time: 05/27/15  2:22 AM  Result Value Ref Range   WBC 10.3 4.0 - 10.5 K/uL   RBC 2.49 (L) 4.22 - 5.81 MIL/uL   Hemoglobin 7.6 (L) 13.0 - 17.0 g/dL  HCT 22.8 (L) 39.0 - 52.0 %   MCV 91.6 78.0 - 100.0 fL   MCH 30.5 26.0 - 34.0 pg   MCHC 33.3 30.0 - 36.0 g/dL   RDW 16.1 09.6 - 04.5 %   Platelets 203 150 - 400 K/uL   Neutrophils Relative % 86 %   Neutro Abs 8.8 (H) 1.7 - 7.7 K/uL   Lymphocytes Relative 5 %   Lymphs Abs 0.5 (L) 0.7 - 4.0 K/uL   Monocytes Relative 8 %   Monocytes Absolute 0.8 0.1 - 1.0 K/uL   Eosinophils Relative 1 %   Eosinophils Absolute 0.1 0.0 - 0.7 K/uL   Basophils Relative 0 %   Basophils Absolute 0.0 0.0 - 0.1 K/uL  Magnesium     Status: Abnormal   Collection Time: 05/27/15  2:22 AM  Result Value Ref Range   Magnesium 2.5 (H) 1.7 - 2.4 mg/dL  Glucose, capillary     Status: Abnormal   Collection Time: 05/27/15  4:09 AM  Result Value Ref Range   Glucose-Capillary 55 (L) 65 - 99 mg/dL   Comment 1 Notify RN    Comment 2 Document in Chart   Glucose, capillary     Status: None   Collection Time: 05/27/15  4:38 AM  Result Value Ref Range   Glucose-Capillary 73 65 - 99 mg/dL   Comment 1 Capillary Specimen     Intake/Output Summary (Last 24 hours) at 05/27/15 0938 Last data filed at 05/27/15 0900  Gross per 24 hour  Intake 314.55 ml  Output      0 ml  Net 314.55 ml    ECHO:  - Left ventricle: The cavity size was  normal. Wall thickness was increased in a pattern of mild LVH. Systolic function was moderately to severely reduced. The estimated ejection fraction was in the range of 30% to 35%. There is severe hypokinesis of the entireinferior myocardium. Doppler parameters are consistent with high ventricular filling pressure. - Mitral valve: There was mild regurgitation.  ASSESSMENT AND PLAN:  NSTEMI:   Cath yesterday with 3 vessel CAD.  I reviewed the cath films today.   Cardiothoracic surgery has been consulted and he is tentatively on the schedule for Friday.  CARDIOMYOPATHY:  EF is now found to be low at 30 - 35%.  Unable to titrate meds.  Volume controlled via dialysis.   SYNCOPE/BRADYCARDIA:  Beta blocker discontinued.    HYPOTENSION:   BP is running low.  No bradycardia off beta blocker.  Also off amlodipine.  No clear etiology.   No evidence of pericardial effusion on echo.  No med titration at this point.   ANEMIA:  Likely secondary to renal failure and anemia of chronic disease.  There has been no evidence of active bleeding.   Iron is low. Stool guaiac is pending.       [place] 05/27/2015 9:38 AM

## 2015-05-27 NOTE — H&P (Signed)
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 Patient also has known underlying diabetes and end-stage renal disease. He reports anterior chest discomfort with radiation to his right jaw each previously he had been contributing to esophageal spasm due to timing with oral intake. Patient reports that his chest discomfort has improved significantly since earlier this evening and his jaw discomfort he now rates as 3/10. He denies any prior syncope or near syncope except for yesterday after hemodialysis when he became unresponsive. He was transported to outside hospital emergency room and was noted to have bradycardia into the 30s transiently. The patient has also noted that his blood pressure has been significantly better controlled without the use of medications that have been required in the past. This is been a noted change in the last couple weeks and one that is unexplained. He denies any current nausea or vomiting. He denies any abdominal pain. No melena or hematochezia. He denies any subjective fever, chills, or sweats. He denies any dyspnea or cough. Patient was started on a heparin drip and transported to our hospital for further evaluation by cardiology.    SIGNIFICANT EVENTS  10/10 - Admit from OSH EKG 10/10 - sinus rhythm with ST elevation in lead III & AVR   SUBJECTIVE:   05/27/15 - Now in HD suite getting routine HD. PEr Dr Antoine Poche - cards will be primary moving forward. Patient denies chest pain or dyspnea/ Per Cards - cath show 3v CAD and CVTS consulted. EF now 35%. Been running low BP so bp meds stopped. In HD UF goal 3L today. No role for iron per renal. /sp epo by renal - hgb is < 8gm% . SBP 120 in HD suite per renal RN and tolerating HD  well  VITAL SIGNS: Temp:  [97.8 F (36.6 C)-98.7 F (37.1 C)] 98.7 F (37.1 C) (10/11 0829) Pulse Rate:  [0-99] 87 (10/11 1030) Resp:  [2-40] 20 (10/11 1030) BP: (81-135)/(43-73) 128/68 mmHg (10/11 1030) SpO2:  [0 %-100 %] 98 % (10/11 1030) Weight:  [82.3 kg (181 lb 7 oz)] 82.3 kg (181 lb 7 oz) (10/11 0829)  PHYSICAL EXAMINATION: General:  Awake. Alert. No acute distress. Laying in bed in the dark watching TV.  Integument:  Warm & dry. No rash on exposed skin. No bruising. Lymphatics:  No appreciated cervical or supraclavicular lymphadenoapthy. HEENT:  Moist mucus membranes. No oral ulcers. No scleral injection. PERRL. Cardiovascular:  Regular rate. No edema. No appreciable JVD.  Pulmonary:  Good aeration & clear to auscultation bilaterally. Symmetric chest wall rise. No accessory muscle use. Abdomen: Soft. Normal bowel sounds. Protuberant. Grossly nontender. Musculoskeletal:  Normal bulk and tone. Hand grip strength 5/5 bilaterally. No joint deformity or effusion appreciated. Neurological:  CN 2-12 grossly in tact. No meningismus. Moving all 4 extremities equally.  Psychiatric:  Mood and affect congruent. Speech normal rhythm, rate & tone.    PULMONARY No results for input(s): PHART, PCO2ART, PO2ART, HCO3, TCO2, O2SAT in the last 168 hours.  Invalid input(s): PCO2, PO2  CBC  Recent Labs Lab 05/25/15 2225 05/26/15 0508 05/27/15 0222  HGB 8.1* 7.7* 7.6*  HCT 24.9* 23.3* 22.8*  WBC 9.4 15.8* 10.3  PLT 183 227 203    COAGULATION  Recent Labs Lab 05/26/15 0508  INR 1.41    CARDIAC   Recent Labs Lab 05/25/15 2225 05/26/15 0508  TROPONINI 1.31* 9.25*   No results for input(s): PROBNP in the last 168 hours.   CHEMISTRY  Recent Labs Lab 05/25/15 2225 05/26/15 0508 05/27/15 0222  NA 129* 130* 130*  K 4.9 4.7 4.4  CL 89* 88* 90*  CO2 GLUCOSE 688* 414* 70  BUN 54* 53* 59*  CREATININE 7.20* 7.51* 8.57*  CALCIUM 7.9* 7.9* 7.7*  MG 2.4  --   2.5*   Estimated Creatinine Clearance: 9.1 mL/min (by C-G formula based on Cr of 8.57).   LIVER  Recent Labs Lab 05/26/15 0508 05/27/15 0222  AST  --  186*  ALT  --  187*  ALKPHOS  --  284*  BILITOT  --  3.6*  PROT  --  6.3*  ALBUMIN  --  2.5*  INR 1.41  --      INFECTIOUS No results for input(s): LATICACIDVEN, PROCALCITON in the last 168 hours.   ENDOCRINE CBG (last 3)   Recent Labs  05/27/15 0008 05/27/15 0409 05/27/15 0438  GLUCAP 74 55* 73         IMAGING x48h  - image(s) personally visualized  -   highlighted in bold Dg Chest 1 View  05/26/2015   CLINICAL DATA:  Possible syncopal episode with bradycardia and brief episode of unresponsiveness. Hyperglycemia.  EXAM: CHEST 1 VIEW  COMPARISON:  08/16/2013  FINDINGS: Shallow inspiration with atelectasis in the lung bases. Cardiac enlargement with mild pulmonary vascular congestion, developing since previous study. Mild perihilar infiltration suggesting early edema. Small right pleural effusion. No pneumothorax. Calcified and tortuous aorta.  IMPRESSION: Shallow inspiration with linear atelectasis in the mid and lower lungs. Cardiac enlargement with mild pulmonary vascular congestion, mild perihilar edema, and small right pleural effusion.   Electronically Signed   By: Burman Nieves M.D.   On: 05/26/2015 00:06         ASSESSMENT / PLAN: 64 year old male with known diabetes mellitus, end-stage renal disease on hemodialysis, & coronary artery disease presenting with non-ST elevation MI.    1. Non-STEMI: Cardiology consulted & Dr. Mayford Knife has seen. Plan for CABG . Continuing patient on heparin infusion & aspirin.                                            2. Insulin-dependent diabetes mellitus: SSIAnemia: Confirmed with the patient that this is likely secondary to his renal failure & an ongoing issue. Per cardiology's request I have ordered a stool Hemoccult. Serum vitamin B12, ferritin, iron, TIBC, & folate  RBC already ordered. Plan to trend with daily CBC. 3. Hypotension: Holding ANTIHYPERTENSIVE regimen given syncope. In HD suite BP ok  4. End-stage renal disease: Per RENAL  5. Diabetic gastroparesis: Plan to resume patient's home regimen STart renal/carb modified diet  6. CIDP: Continuing Lexapro, fentanyl patch and ativan prn - home med . 7. Prophylaxis: Systemic anticoagulation with heparin.  8. CODE STATUS: Patient is full code per my discussion with him this morning. 9. Anemia of icu and chronic dz - 1 unit prbc over 4h - to get hgb  > 8gm%  - d/w Dr Antoine Poche and Dr Marisue Humble of renal   Dispo Move to cardiac tele. Dr Antoine Poche will assumer primary   Dr. Kalman Shan, M.D., [name].C.P Pulmonary and Critical Care Medicine  Staff Physician [facility] Pager: [phone], If no answer or between  15:00h - 7:00h: call [phone]  05/27/2015 11:16 AM

## 2015-05-27 NOTE — Progress Notes (Signed)
Pt transferred from [facility] early afternoon. He has been free from chest pain. He is being worked up for CABG in three days.  He had dialysis this morning and tolerated well. He is awaiting IV team to insert piv for blood transfusion for low hgb.. Family at bedside.

## 2015-05-27 NOTE — Progress Notes (Signed)
ANTICOAGULATION CONSULT NOTE - Follow Up Consult  Pharmacy Consult for heparin Indication: CAD awaiting CVTS consult   Labs:  Recent Labs  05/25/15 2225 05/26/15 0508 05/27/15 0222  HGB 8.1* 7.7* 7.6*  HCT 24.9* 23.3* 22.8*  PLT 183 227 203  LABPROT  --  17.4*  --   INR  --  1.41  --   HEPARINUNFRC  --   --  0.20*  CREATININE 7.20* 7.51* 8.57*  TROPONINI 1.31* 9.25*  --      Assessment: 64yo male subtherapeutic on heparin after resumed post-cath.  Goal of Therapy:  Heparin level 0.3-0.7 units/ml   Plan:  Will increase heparin gtt by 2 units/kg/hr to 1150 units/hr and check level in 8hr.  Vernard Gambles, PharmD, BCPS  05/27/2015,3:18 AM

## 2015-05-28 ENCOUNTER — Ambulatory Visit (HOSPITAL_COMMUNITY): Payer: Medicare Other

## 2015-05-28 ENCOUNTER — Inpatient Hospital Stay (HOSPITAL_COMMUNITY): Payer: Medicare Other

## 2015-05-28 DIAGNOSIS — R0602 Shortness of breath: Secondary | ICD-10-CM

## 2015-05-28 DIAGNOSIS — I25110 Atherosclerotic heart disease of native coronary artery with unstable angina pectoris: Secondary | ICD-10-CM

## 2015-05-28 DIAGNOSIS — I255 Ischemic cardiomyopathy: Secondary | ICD-10-CM

## 2015-05-28 LAB — HEPARIN LEVEL (UNFRACTIONATED)
HEPARIN UNFRACTIONATED: 0.21 [IU]/mL — AB (ref 0.30–0.70)
Heparin Unfractionated: 0.28 IU/mL — ABNORMAL LOW (ref 0.30–0.70)

## 2015-05-28 LAB — TYPE AND SCREEN
ABO/RH(D): A POS
ANTIBODY SCREEN: NEGATIVE
Unit division: 0

## 2015-05-28 LAB — BLOOD GAS, ARTERIAL
Acid-Base Excess: 2.3 mmol/L — ABNORMAL HIGH (ref 0.0–2.0)
Bicarbonate: 26.1 mEq/L — ABNORMAL HIGH (ref 20.0–24.0)
Drawn by: 35849
FIO2: 0.21
O2 Saturation: 93.7 %
Patient temperature: 98.6
TCO2: 27.3 mmol/L (ref 0–100)
pCO2 arterial: 38.7 mmHg (ref 35.0–45.0)
pH, Arterial: 7.444 (ref 7.350–7.450)
pO2, Arterial: 66.6 mmHg — ABNORMAL LOW (ref 80.0–100.0)

## 2015-05-28 LAB — HEPATIC FUNCTION PANEL
ALT: 124 U/L — ABNORMAL HIGH (ref 17–63)
AST: 69 U/L — ABNORMAL HIGH (ref 15–41)
Albumin: 2.3 g/dL — ABNORMAL LOW (ref 3.5–5.0)
Alkaline Phosphatase: 276 U/L — ABNORMAL HIGH (ref 38–126)
Bilirubin, Direct: 0.8 mg/dL — ABNORMAL HIGH (ref 0.1–0.5)
Indirect Bilirubin: 1.1 mg/dL — ABNORMAL HIGH (ref 0.3–0.9)
Total Bilirubin: 1.9 mg/dL — ABNORMAL HIGH (ref 0.3–1.2)
Total Protein: 6.2 g/dL — ABNORMAL LOW (ref 6.5–8.1)

## 2015-05-28 LAB — GLUCOSE, CAPILLARY
GLUCOSE-CAPILLARY: 65 mg/dL (ref 65–99)
GLUCOSE-CAPILLARY: 115 mg/dL — AB (ref 65–99)
GLUCOSE-CAPILLARY: 124 mg/dL — AB (ref 65–99)
GLUCOSE-CAPILLARY: 149 mg/dL — AB (ref 65–99)
Glucose-Capillary: 128 mg/dL — ABNORMAL HIGH (ref 65–99)
Glucose-Capillary: 212 mg/dL — ABNORMAL HIGH (ref 65–99)

## 2015-05-28 LAB — HEMOGLOBIN AND HEMATOCRIT, BLOOD
HEMATOCRIT: 25.5 % — AB (ref 39.0–52.0)
HEMOGLOBIN: 8.4 g/dL — AB (ref 13.0–17.0)

## 2015-05-28 LAB — HEPATITIS B SURFACE ANTIGEN: Hepatitis B Surface Ag: NEGATIVE

## 2015-05-28 LAB — POCT ACTIVATED CLOTTING TIME: Activated Clotting Time: 147 seconds

## 2015-05-28 LAB — AMMONIA: Ammonia: 35 umol/L (ref 9–35)

## 2015-05-28 NOTE — Care Management Note (Addendum)
Case Management Note  Patient Details  Name: Kizzie FurnishRobert M Faust MRN: [id number] Date of Birth: [birth year]  Subjective/Objective:    Pt admitted with NON STEMI, CABG scheduled for 05/30/15                Action/Plan:  Pt is from home with wife.  Per pt; he has been suffering from neuropathy for over 14 years and is not independently ambulating at home.  Pt states he uses wheelchair outside of home but is able to manage with cane inside home.  Post surgery Cardiac Rehab will work with pt and provide recommendations for discharge needs.  CM will continue to monitor.   Expected Discharge Date:                  Expected Discharge Plan:  Home/Self Care  In-House Referral:     Discharge planning Services  CM Consult  Post Acute Care Choice:    Choice offered to:     DME Arranged:    DME Agency:     HH Arranged:    HH Agency:     Status of Service:  In process, will continue to follow  Medicare Important Message Given:    Date Medicare IM Given:    Medicare IM give by:    Date Additional Medicare IM Given:    Additional Medicare Important Message give by:     If discussed at Long Length of Stay Meetings, dates discussed:    Additional Comments:  Cherylann ParrClaxton, Trinia Georgi S, RN 05/28/2015, 9:53 AM

## 2015-05-28 NOTE — Progress Notes (Signed)
ANTICOAGULATION CONSULT NOTE - Follow Up Consult  Pharmacy Consult for Heparin  Indication: Multi-vessel CAD, likely CABG Friday  Allergies  Allergen Reactions  . Zosyn [Piperacillin Sod-Tazobactam So] Itching and Rash    Patient Measurements: Height: 5\' 8"  (172.7 cm) Weight: 179 lb 6.4 oz (81.375 kg) IBW/kg (Calculated) : 68.4  Labs:  Recent Labs  05/25/15 2225 05/26/15 0508  05/27/15 0222 05/27/15 1540 05/28/15 0045 05/28/15 0826  HGB 8.1* 7.7*  --  7.6*  --  8.4*  --   HCT 24.9* 23.3*  22.9*  --  22.8*  --  25.5*  --   PLT 183 227  --  203  --   --   --   LABPROT  --  17.4*  --   --   --   --   --   INR  --  1.41  --   --   --   --   --   HEPARINUNFRC  --   --   < > 0.20* 1.07* 0.21* 0.28*  CREATININE 7.20* 7.51*  --  8.57*  --   --   --   TROPONINI 1.31* 9.25*  --   --   --   --   --   < > = values in this interval not displayed.  Estimated Creatinine Clearance: 8.4 mL/min (by C-G formula based on Cr of 8.57).   Assessment: 64 year old male continues on heparin awaiting for CABG on Friday HL = 0.28  Goal of Therapy:  Heparin level 0.3-0.7 units/ml Monitor platelets by anticoagulation protocol: Yes   Plan:  Increase heparin to 1450 units / hr Follow up AM labs  Thank you Okey RegalLisa Reymundo Winship, PharmD [phone]  05/28/2015,9:34 AM

## 2015-05-28 NOTE — Progress Notes (Signed)
Received preop order. Pt sts he is unable to walk more than 20 ft normally. Sts he requires max assist (wife) to stand due to weak quads and core strength. He stays in recliner most of the time for 14 yrs. Pt more appropriate for PT/OT for bed to chair mobility preop and post op. Pt agrees to this. Discussed IS use, chair mobility, book and video. Voiced understanding. Will sign off for now. RN ordered PT. [id number] Ethelda ChickKristan Leng Montesdeoca CES, ACSM 1:59 PM 05/28/2015

## 2015-05-28 NOTE — Progress Notes (Signed)
SUBJECTIVE:  No chest pain.  No SOB   PHYSICAL EXAM Filed Vitals:   05/27/15 2105 05/27/15 2258 05/28/15 0427 05/28/15 0438  BP: 119/62 124/63 117/66   Pulse: 95 90 95   Temp: 99.5 F (37.5 C) 99.4 F (37.4 C) 99.6 F (37.6 C)   TempSrc: Oral Oral Oral   Resp: Height:      Weight:    179 lb 6.4 oz (81.375 kg)  SpO2: 96% 97% 94%    General:  No distress Lungs:  Bilateral crackles to 1/3 up.  Heart:  RRR Abdomen:  Positive bowel sounds, no rebound no guarding Extremities:  No edema.  LABS:  Results for orders placed or performed during the hospital encounter of 05/26/15 (from the past 24 hour(s))  Type and screen [facility]     Status: None (Preliminary result)   Collection Time: 05/27/15 11:20 AM  Result Value Ref Range   ABO/RH(D) A POS    Antibody Screen NEG    Sample Expiration 05/30/2015    Unit Number [id number]    Blood Component Type RED CELLS,LR    Unit division 00    Status of Unit ISSUED    Transfusion Status OK TO TRANSFUSE    Crossmatch Result Compatible   Prepare RBC     Status: None   Collection Time: 05/27/15 11:20 AM  Result Value Ref Range   Order Confirmation ORDER PROCESSED BY BLOOD BANK   ABO/Rh     Status: None   Collection Time: 05/27/15 11:20 AM  Result Value Ref Range   ABO/RH(D) A POS   Glucose, capillary     Status: Abnormal   Collection Time: 05/27/15 12:25 PM  Result Value Ref Range   Glucose-Capillary 60 (L) 65 - 99 mg/dL  Glucose, capillary     Status: None   Collection Time: 05/27/15 12:48 PM  Result Value Ref Range   Glucose-Capillary 80 65 - 99 mg/dL  Heparin level (unfractionated)     Status: Abnormal   Collection Time: 05/27/15  3:40 PM  Result Value Ref Range   Heparin Unfractionated 1.07 (H) 0.30 - 0.70 IU/mL  Glucose, capillary     Status: Abnormal   Collection Time: 05/27/15  4:54 PM  Result Value Ref Range   Glucose-Capillary 192 (H) 65 - 99 mg/dL   Comment 1 Notify RN    Comment 2 Document in Chart   Glucose, capillary     Status: Abnormal   Collection Time: 05/27/15  9:34 PM  Result Value Ref Range   Glucose-Capillary 219 (H) 65 - 99 mg/dL  Glucose, capillary     Status: Abnormal   Collection Time: 05/27/15 11:49 PM  Result Value Ref Range   Glucose-Capillary 172 (H) 65 - 99 mg/dL  Heparin level (unfractionated)     Status: Abnormal   Collection Time: 05/28/15 12:45 AM  Result Value Ref Range   Heparin Unfractionated 0.21 (L) 0.30 - 0.70 IU/mL  Hemoglobin and hematocrit, blood     Status: Abnormal   Collection Time: 05/28/15 12:45 AM  Result Value Ref Range   Hemoglobin 8.4 (L) 13.0 - 17.0 g/dL   HCT 11.9 (L) 14.7 - 82.9 %  Glucose, capillary     Status: Abnormal   Collection Time: 05/28/15  4:22 AM  Result Value Ref Range   Glucose-Capillary 128 (H) 65 - 99 mg/dL  Glucose, capillary     Status: None   Collection Time: 05/28/15  8:04 AM  Result Value Ref Range   Glucose-Capillary 65 65 - 99 mg/dL   Comment 1 Notify RN   Heparin level (unfractionated)     Status: Abnormal   Collection Time: 05/28/15  8:26 AM  Result Value Ref Range   Heparin Unfractionated 0.28 (L) 0.30 - 0.70 IU/mL  Glucose, capillary     Status: Abnormal   Collection Time: 05/28/15  8:47 AM  Result Value Ref Range   Glucose-Capillary 115 (H) 65 - 99 mg/dL   Comment 1 Notify RN     Intake/Output Summary (Last 24 hours) at 05/28/15 1100 Last data filed at 05/28/15 0800  Gross per 24 hour  Intake 849.58 ml  Output   3012 ml  Net -2162.42 ml    ECHO:  - Left ventricle: The cavity size was normal. Wall thickness was increased in a pattern of mild LVH. Systolic function was moderately to severely reduced. The estimated ejection fraction was in the range of 30% to 35%. There is severe hypokinesis of the entireinferior myocardium. Doppler parameters are consistent with high ventricular filling pressure. - Mitral valve: There was mild  regurgitation.  ASSESSMENT AND PLAN:  NSTEMI:   Cath yesterday with 3 vessel CAD.  Dr. Donata ClayVan Trigt saw the patient today and he is on the schedule for Friday.  CARDIOMYOPATHY:  EF is now found to be low at 30 - 35%.  Unable to titrate meds.  Volume controlled via dialysis.  However, I agree that he has crackles on lung exam.  He will aim for a lower dry weight with dialysis tomorrow.    SYNCOPE/BRADYCARDIA:  Beta blocker discontinued.    HYPOTENSION:   BP is running low.  No bradycardia off beta blocker.  Also off amlodipine.  No clear etiology.   No evidence of pericardial effusion on echo.  No med titration at this point.    ANEMIA:  Likely secondary to renal failure and anemia of chronic disease.  He received one unit of blood yesterday.  I will defer further transfusion to Dr. Donata ClayVan Trigt.   Daniel Anthony 05/28/2015 11:00 AM

## 2015-05-28 NOTE — Progress Notes (Signed)
ANTICOAGULATION CONSULT NOTE - Follow Up Consult  Pharmacy Consult for Heparin  Indication: Multi-vessel CAD, likely CABG  Allergies  Allergen Reactions  . Zosyn [Piperacillin Sod-Tazobactam So] Itching and Rash    Patient Measurements: Height: 5\' 8"  (172.7 cm) Weight: 174 lb 13.2 oz (79.3 kg) IBW/kg (Calculated) : 68.4  Labs:  Recent Labs  05/25/15 2225 05/26/15 0508 05/27/15 0222 05/27/15 1540 05/28/15 0045  HGB 8.1* 7.7* 7.6*  --  8.4*  HCT 24.9* 23.3*  22.9* 22.8*  --  25.5*  PLT 183 227 203  --   --   LABPROT  --  17.4*  --   --   --   INR  --  1.41  --   --   --   HEPARINUNFRC  --   --  0.20* 1.07* 0.21*  CREATININE 7.20* 7.51* 8.57*  --   --   TROPONINI 1.31* 9.25*  --   --   --     Estimated Creatinine Clearance: 8.4 mL/min (by C-G formula based on Cr of 8.57).   Assessment: Sub-therapeutic heparin level, earlier level of 1.07 drawn right next to infusion site, pt has limited access due to being HD patient, no issues per RN.   Goal of Therapy:  Heparin level 0.3-0.7 units/ml Monitor platelets by anticoagulation protocol: Yes   Plan:  -Increase heparin to 1300 units/hr -0900 HL -Daily CBC/HL -Monitor for bleeding  Abran DukeLedford, Melendrez 05/28/2015,2:35 AM

## 2015-05-28 NOTE — Progress Notes (Signed)
Admit: 05/26/2015 LOS: 2  69M ESRD Home HD K Machine L FA AVF with NSTEMI and LHC sowing MV Disease, tentative plan for CABG 10/14  Subjective:  No new events HD yesterday,  3L UF, post weight 79.3kg Rec 1u PRBC after HD, Hb > 8 this AM, also on weekly ESA  10/11 0701 - 10/12 0700 In: 652.6 [P.O.:240; I.V.:43; Blood:369.6] Out: 3012   Filed Weights   05/27/15 0829 05/27/15 1235 05/28/15 0438  Weight: 82.3 kg (181 lb 7 oz) 79.3 kg (174 lb 13.2 oz) 81.375 kg (179 lb 6.4 oz)    Scheduled Meds: . sodium chloride   Intravenous Once  . calcitRIOL  0.25 mcg Oral BID  . calcium acetate  667 mg Oral TID WC  . darbepoetin (ARANESP) injection - DIALYSIS  200 mcg Intravenous Q Tue-HD  . escitalopram  20 mg Oral QHS  . fentaNYL  100 mcg Transdermal Q72H  . insulin aspart  0-9 Units Subcutaneous 6 times per day  . insulin detemir  25 Units Subcutaneous QHS  . multivitamin  1 tablet Oral QHS  . sodium chloride  3 mL Intravenous Q12H  . sodium chloride  3 mL Intravenous Q12H  . tamsulosin  0.4 mg Oral QHS   Continuous Infusions: . heparin 1,300 Units/hr (05/28/15 0238)   PRN Meds:.sodium chloride, sodium chloride, sodium chloride, acetaminophen, LORazepam, sodium chloride, sodium chloride  Current Labs: reviewed    Physical Exam:  Blood pressure 117/66, pulse 95, temperature 99.6 F (37.6 C), temperature source Oral, resp. rate 18, height 5\' 8"  (1.727 m), weight 81.375 kg (179 lb 6.4 oz), SpO2 94 %. GEN: NAD ENT: NCAT EYES: EOMI CV: RRR no rub PULM: CTAB ABD: s/nt/nd SKIN: no rashes/lesions EXT:No LEE LFA AVF -- Faint B/T  Unit: Home HD -- K Machine Days: MWF Time: 4h Dialyzer: F180 EDW: 79kg K/Ca: 2/2.25 Access: AVF LUE BFR/DFR: 400/A2 UF Proflie: none VDRA: PO Calcitriol 0.4025mcg daily EPO: Mircera Most Recent Phos / PTH: 6.2 / 549 Most Recent TSAT / Ferritin: 53/935 Treatment Adherence: Good  A/P 1. ESRD:  1. MWF home hemo via L FA AVF 2. Next HD 10/13 off  schedule keep THS Right now given operative plans 3. No heparin in system, on systemic heparin 4. Wife cannulates AVF 5. Keep EDW here 2. ACS NSTEMI 1. LHC 10/10 with MV disease 2. CABG scheduled 10/14 3. Hep Gtt 3. HTN/Vol: as above off BP Meds 4. Anemia:  1. On ESA as outpt, given 200 Aranesp 10/11, scheduled weekly 2. No IV Fe 3. 1u PRBC given 05/27/15 4. Monitor 5. MBD: 1. Cont outpt meds of PhosLo and Calcitriol  Daniel Heckyan Penny Arrambide MD 05/28/2015, 10:13 AM   Recent Labs Lab 05/25/15 2225 05/26/15 0508 05/27/15 0222  NA 129* 130* 130*  K 4.9 4.7 4.4  CL 89* 88* 90*  CO2 22 26 23   GLUCOSE 688* 414* 70  BUN 54* 53* 59*  CREATININE 7.20* 7.51* 8.57*  CALCIUM 7.9* 7.9* 7.7*    Recent Labs Lab 05/25/15 2225 05/26/15 0508 05/27/15 0222 05/28/15 0045  WBC 9.4 15.8* 10.3  --   NEUTROABS  --   --  8.8*  --   HGB 8.1* 7.7* 7.6* 8.4*  HCT 24.9* 23.3*  22.9* 22.8* 25.5*  MCV 95.5 92.8 91.6  --   PLT 183 227 203  --

## 2015-05-29 ENCOUNTER — Inpatient Hospital Stay (HOSPITAL_COMMUNITY): Payer: Medicare Other

## 2015-05-29 LAB — CBC
HEMATOCRIT: 26.4 % — AB (ref 39.0–52.0)
Hemoglobin: 8.6 g/dL — ABNORMAL LOW (ref 13.0–17.0)
MCH: 30.2 pg (ref 26.0–34.0)
MCHC: 32.6 g/dL (ref 30.0–36.0)
MCV: 92.6 fL (ref 78.0–100.0)
Platelets: 243 10*3/uL (ref 150–400)
RBC: 2.85 MIL/uL — AB (ref 4.22–5.81)
RDW: 16.2 % — AB (ref 11.5–15.5)
WBC: 8 10*3/uL (ref 4.0–10.5)

## 2015-05-29 LAB — HEPARIN LEVEL (UNFRACTIONATED)
HEPARIN UNFRACTIONATED: 0.18 [IU]/mL — AB (ref 0.30–0.70)
Heparin Unfractionated: 0.2 IU/mL — ABNORMAL LOW (ref 0.30–0.70)

## 2015-05-29 LAB — RENAL FUNCTION PANEL
Albumin: 2.1 g/dL — ABNORMAL LOW (ref 3.5–5.0)
Anion gap: 16 — ABNORMAL HIGH (ref 5–15)
BUN: 19 mg/dL (ref 6–20)
CHLORIDE: 101 mmol/L (ref 101–111)
CO2: 27 mmol/L (ref 22–32)
Calcium: 9 mg/dL (ref 8.9–10.3)
Creatinine, Ser: 3.45 mg/dL — ABNORMAL HIGH (ref 0.61–1.24)
GFR calc Af Amer: 20 mL/min — ABNORMAL LOW (ref >60)
GFR, EST NON AFRICAN AMERICAN: 17 mL/min — AB (ref >60)
Glucose, Bld: 79 mg/dL (ref 65–99)
POTASSIUM: 3.1 mmol/L — AB (ref 3.5–5.1)
Phosphorus: 3 mg/dL (ref 2.5–4.6)
Sodium: 144 mmol/L (ref 135–145)

## 2015-05-29 LAB — SPIROMETRY WITH GRAPH
FEF 25-75 Post: 1.05 L/sec
FEF 25-75 Pre: 1.14 L/sec
FEF2575-%Change-Post: -8 %
FEF2575-%Pred-Post: 40 %
FEF2575-%Pred-Pre: 44 %
FEV1-%Change-Post: 2 %
FEV1-%Pred-Post: 38 %
FEV1-%Pred-Pre: 37 %
FEV1-Post: 1.24 L
FEV1-Pre: 1.21 L
FEV1FVC-%Change-Post: 1 %
FEV1FVC-%Pred-Pre: 104 %
FEV6-%Change-Post: 0 %
FEV6-%Pred-Post: 38 %
FEV6-%Pred-Pre: 37 %
FEV6-Post: 1.56 L
FEV6-Pre: 1.54 L
FEV6FVC-%Pred-Post: 105 %
FEV6FVC-%Pred-Pre: 105 %
FVC-%Change-Post: 0 %
FVC-%Pred-Post: 36 %
FVC-%Pred-Pre: 35 %
FVC-Post: 1.56 L
FVC-Pre: 1.54 L
Post FEV1/FVC ratio: 80 %
Post FEV6/FVC ratio: 100 %
Pre FEV1/FVC ratio: 79 %
Pre FEV6/FVC Ratio: 100 %

## 2015-05-29 LAB — PREPARE RBC (CROSSMATCH)

## 2015-05-29 LAB — GLUCOSE, CAPILLARY
GLUCOSE-CAPILLARY: 120 mg/dL — AB (ref 65–99)
GLUCOSE-CAPILLARY: 219 mg/dL — AB (ref 65–99)
GLUCOSE-CAPILLARY: 292 mg/dL — AB (ref 65–99)
Glucose-Capillary: 105 mg/dL — ABNORMAL HIGH (ref 65–99)
Glucose-Capillary: 150 mg/dL — ABNORMAL HIGH (ref 65–99)
Glucose-Capillary: 194 mg/dL — ABNORMAL HIGH (ref 65–99)
Glucose-Capillary: 59 mg/dL — ABNORMAL LOW (ref 65–99)

## 2015-05-29 LAB — OCCULT BLOOD X 1 CARD TO LAB, STOOL: Fecal Occult Bld: NEGATIVE

## 2015-05-29 LAB — SURGICAL PCR SCREEN
MRSA, PCR: NEGATIVE
Staphylococcus aureus: NEGATIVE

## 2015-05-29 MED ORDER — EPINEPHRINE HCL 1 MG/ML IJ SOLN
0.0000 ug/min | INTRAMUSCULAR | Status: AC
  Administered 2015-05-30: 2 ug/min via INTRAVENOUS
  Filled 2015-05-29: qty 4

## 2015-05-29 MED ORDER — SODIUM CHLORIDE 0.9 % IV SOLN
INTRAVENOUS | Status: AC
  Administered 2015-05-30: 1 [IU]/h via INTRAVENOUS
  Filled 2015-05-29: qty 2.5

## 2015-05-29 MED ORDER — PHENYLEPHRINE HCL 10 MG/ML IJ SOLN
30.0000 ug/min | INTRAVENOUS | Status: AC
  Administered 2015-05-30: 15 ug/min via INTRAVENOUS
  Filled 2015-05-29: qty 2

## 2015-05-29 MED ORDER — MAGNESIUM SULFATE 50 % IJ SOLN
40.0000 meq | INTRAMUSCULAR | Status: DC
  Filled 2015-05-29: qty 10

## 2015-05-29 MED ORDER — SODIUM CHLORIDE 0.9 % IV SOLN
INTRAVENOUS | Status: AC
  Administered 2015-05-30: 70 mL/h via INTRAVENOUS
  Filled 2015-05-29: qty 40

## 2015-05-29 MED ORDER — CHLORHEXIDINE GLUCONATE CLOTH 2 % EX PADS
6.0000 | MEDICATED_PAD | Freq: Once | CUTANEOUS | Status: AC
  Administered 2015-05-29: 6 via TOPICAL

## 2015-05-29 MED ORDER — SODIUM CHLORIDE 0.9 % IV SOLN
INTRAVENOUS | Status: DC
  Filled 2015-05-29: qty 30

## 2015-05-29 MED ORDER — POTASSIUM CHLORIDE CRYS ER 20 MEQ PO TBCR
40.0000 meq | EXTENDED_RELEASE_TABLET | Freq: Once | ORAL | Status: DC
  Filled 2015-05-29: qty 2

## 2015-05-29 MED ORDER — VANCOMYCIN HCL 10 G IV SOLR
1250.0000 mg | INTRAVENOUS | Status: AC
  Administered 2015-05-30: 1250 mg via INTRAVENOUS
  Filled 2015-05-29 (×2): qty 1250

## 2015-05-29 MED ORDER — DOPAMINE-DEXTROSE 3.2-5 MG/ML-% IV SOLN
0.0000 ug/kg/min | INTRAVENOUS | Status: DC
  Filled 2015-05-29: qty 250

## 2015-05-29 MED ORDER — POTASSIUM CHLORIDE 2 MEQ/ML IV SOLN
80.0000 meq | INTRAVENOUS | Status: DC
Start: 2015-05-30 — End: 2015-05-30
  Filled 2015-05-29: qty 40

## 2015-05-29 MED ORDER — DEXTROSE 5 % IV SOLN
750.0000 mg | INTRAVENOUS | Status: DC
  Filled 2015-05-29: qty 750

## 2015-05-29 MED ORDER — CHLORHEXIDINE GLUCONATE CLOTH 2 % EX PADS
6.0000 | MEDICATED_PAD | Freq: Once | CUTANEOUS | Status: AC
  Administered 2015-05-30: 6 via TOPICAL

## 2015-05-29 MED ORDER — DIAZEPAM 2 MG PO TABS
2.0000 mg | ORAL_TABLET | Freq: Once | ORAL | Status: AC
  Administered 2015-05-30: 2 mg via ORAL
  Filled 2015-05-29: qty 1

## 2015-05-29 MED ORDER — DEXTROSE 5 % IV SOLN
1.5000 g | INTRAVENOUS | Status: AC
  Administered 2015-05-30: .75 g via INTRAVENOUS
  Administered 2015-05-30: 1.5 g via INTRAVENOUS
  Filled 2015-05-29 (×2): qty 1.5

## 2015-05-29 MED ORDER — PAPAVERINE HCL 30 MG/ML IJ SOLN
INTRAMUSCULAR | Status: AC
  Administered 2015-05-30: 500 mL
  Filled 2015-05-29: qty 2.5

## 2015-05-29 MED ORDER — METOPROLOL TARTRATE 12.5 MG HALF TABLET
12.5000 mg | ORAL_TABLET | Freq: Once | ORAL | Status: AC
  Administered 2015-05-30: 12.5 mg via ORAL
  Filled 2015-05-29: qty 1

## 2015-05-29 MED ORDER — ALBUTEROL SULFATE (2.5 MG/3ML) 0.083% IN NEBU
2.5000 mg | INHALATION_SOLUTION | Freq: Once | RESPIRATORY_TRACT | Status: AC
  Administered 2015-05-29: 2.5 mg via RESPIRATORY_TRACT

## 2015-05-29 MED ORDER — CHLORHEXIDINE GLUCONATE 0.12 % MT SOLN
15.0000 mL | Freq: Once | OROMUCOSAL | Status: AC
  Administered 2015-05-30: 15 mL via OROMUCOSAL
  Filled 2015-05-29: qty 15

## 2015-05-29 MED ORDER — NITROGLYCERIN IN D5W 200-5 MCG/ML-% IV SOLN
2.0000 ug/min | INTRAVENOUS | Status: AC
  Administered 2015-05-30: 5 ug/min via INTRAVENOUS
  Filled 2015-05-29: qty 250

## 2015-05-29 MED ORDER — BISACODYL 5 MG PO TBEC
5.0000 mg | DELAYED_RELEASE_TABLET | Freq: Once | ORAL | Status: AC
  Administered 2015-05-29: 5 mg via ORAL
  Filled 2015-05-29: qty 1

## 2015-05-29 MED ORDER — DEXMEDETOMIDINE HCL IN NACL 400 MCG/100ML IV SOLN
0.1000 ug/kg/h | INTRAVENOUS | Status: AC
  Administered 2015-05-30: .3 ug/kg/h via INTRAVENOUS
  Filled 2015-05-29: qty 100

## 2015-05-29 NOTE — Evaluation (Signed)
Physical Therapy Evaluation Patient Details Name: Daniel FurnishRobert M Anthony MRN: [id number] DOB: 07/09/1951 Today's Date: 05/29/2015   History of Present Illness  pt is a 64 year old Caucasian male with history of coronary artery disease and prior stents, DM, CIPD, foot drop, ESRD  . Transferred from outside hospital for non-ST elevation MI.  Cath shows 3 vessel ds.  Plan for CABG 10/14.  Clinical Impression  Pt admitted with/for NSTEMI and for CABG 10/14.  Pt currently limited functionally due to the problems listed. ( See problems list.)   Pt will benefit from PT to maximize function and safety in order to get ready for next venue listed below.     Follow Up Recommendations CIR    Equipment Recommendations       Recommendations for Other Services Rehab consult     Precautions / Restrictions Precautions Precautions: Fall Restrictions Weight Bearing Restrictions: No      Mobility  Bed Mobility Overal bed mobility: Needs Assistance Bed Mobility: Rolling;Sidelying to Sit;Sit to Supine Rolling: Min guard Sidelying to sit: Min assist   Sit to supine: Min guard   General bed mobility comments: heavy use of arms to accomplish the task  Transfers Overall transfer level: Needs assistance Equipment used: 1 person hand held assist Transfers: Sit to/from Stand Sit to Stand: Min guard         General transfer comment: used UE's and good forward mechanics to stand without assist  Ambulation/Gait Ambulation/Gait assistance: Min guard Ambulation Distance (Feet): 6 Feet Assistive device: Rolling walker (2 wheeled) Gait Pattern/deviations: Step-through pattern;Decreased step length - right;Decreased step length - left;Steppage   Gait velocity interpretation: Below normal speed for age/gender General Gait Details: effortful steppage gait ( due to weak df bil)  Stairs            Wheelchair Mobility    Modified Rankin (Stroke Patients Only)       Balance Overall balance  assessment: Needs assistance Sitting-balance support: No upper extremity supported;Single extremity supported Sitting balance-Leahy Scale: Fair       Standing balance-Leahy Scale: Fair Standing balance comment: short stand with use of the RW                             Pertinent Vitals/Pain Pain Assessment: No/denies pain    Home Living Family/patient expects to be discharged to:: Private residence Living Arrangements: Spouse/significant other Available Help at Discharge: Family;Available 24 hours/day (children live next door.) Type of Home: House Home Access: Stairs to enter Entrance Stairs-Rails: Doctor, general practiceight;Left Entrance Stairs-Number of Steps: 2/1 Home Layout: One level Home Equipment: Wheelchair - [employer]manual;Walker - 2 wheels;Cane - single point;Bedside commode      Prior Function Level of Independence: Independent with assistive device(s)         Comments: Walked in home very short distances with cane and no assist, assist into tub and then bathes himself, standing for brushing teeth and combing hair..  W/C for out running errands with wife.  No longer drives.     Hand Dominance        Extremity/Trunk Assessment   Upper Extremity Assessment: Overall WFL for tasks assessed           Lower Extremity Assessment: Generalized weakness;RLE deficits/detail;LLE deficits/detail RLE Deficits / Details: hip flexors, hams 3+, quads 3+, df 2, pf 4/5, gross ext 4/5, tight heel cord LLE Deficits / Details: hip flexor 3/5, hams 3, quads 3+, df 2/5, pf 4/5, tight heel cord.  Cervical / Trunk Assessment:  (abdominal/ lower trunk weakness)  Communication   Communication: No difficulties  Cognition Arousal/Alertness: Awake/alert Behavior During Therapy: WFL for tasks assessed/performed Overall Cognitive Status: Within Functional Limits for tasks assessed                      General Comments      Exercises        Assessment/Plan    PT Assessment  Patient needs continued PT services  PT Diagnosis Difficulty walking;Abnormality of gait;Generalized weakness   PT Problem List Decreased strength;Decreased range of motion;Decreased activity tolerance;Decreased balance;Decreased mobility;Decreased knowledge of use of DME;Cardiopulmonary status limiting activity  PT Treatment Interventions DME instruction;Gait training;Functional mobility training;Therapeutic activities;Patient/family education   PT Goals (Current goals can be found in the Care Plan section) Acute Rehab PT Goals Patient Stated Goal: Eventually home and agrees as independent as before. PT Goal Formulation: With patient Time For Goal Achievement: 06/12/15 Potential to Achieve Goals: Good    Frequency Min 3X/week   Barriers to discharge        Co-evaluation               End of Session   Activity Tolerance: Patient tolerated treatment well;Patient limited by fatigue Patient left: in bed;with call bell/phone within reach;with family/visitor present Nurse Communication: Mobility status         Time: 1420-1455 PT Time Calculation (min) (ACUTE ONLY): 35 min   Charges:   PT Evaluation $Initial PT Evaluation Tier I: 1 Procedure PT Treatments $Therapeutic Activity: 8-22 mins   PT G Codes:        Tajha Sammarco, Eliseo Gum 05/29/2015, 3:15 PM 05/29/2015  [place] Bing, PT [phone]  (pager)

## 2015-05-29 NOTE — Progress Notes (Signed)
3 Days Post-Op Procedure(s) (LRB): Left Heart Cath and Coronary Angiography (N/A) Subjective: Severe 3 vessel CAD, ischemic cardiomyopathy CIDP- walked 6 feet with PT DM on DD PFTs and dopplers reviewed- for CABG in am Objective: Vital signs in last 24 hours: Temp:  [98.7 F (37.1 C)-99.8 F (37.7 C)] 98.7 F (37.1 C) (10/13 1425) Pulse Rate:  [69-91] 86 (10/13 1425) Cardiac Rhythm:  [-] Normal sinus rhythm (10/13 0700) Resp:  [12-20] 18 (10/13 1425) BP: (106-135)/(53-69) 124/64 mmHg (10/13 1425) SpO2:  [95 %-96 %] 96 % (10/13 1425) Weight:  [175 lb 4.3 oz (79.5 kg)-181 lb 10.5 oz (82.4 kg)] 175 lb 4.3 oz (79.5 kg) (10/13 1143)  Hemodynamic parameters for last 24 hours:  nsr  Intake/Output from previous day: 10/12 0701 - 10/13 0700 In: 480 [P.O.:480] Out: -  Intake/Output this shift: Total I/O In: 240 [P.O.:240] Out: 3400 [Other:3400]  Lungs clear + dependent edema  Lab Results:  Recent Labs  05/27/15 0222 05/28/15 0045 05/29/15 0759  WBC 10.3  --  8.0  HGB 7.6* 8.4* 8.6*  HCT 22.8* 25.5* 26.4*  PLT 203  --  243   BMET:  Recent Labs  05/27/15 0222 05/29/15 0759  NA 130* 144  K 4.4 3.1*  CL 90* 101  CO2 23 27  GLUCOSE 70 79  BUN 59* 19  CREATININE 8.57* 3.45*  CALCIUM 7.7* 9.0    PT/INR: No results for input(s): LABPROT, INR in the last 72 hours. ABG    Component Value Date/Time   PHART 7.444 05/28/2015 1115   HCO3 26.1* 05/28/2015 1115   TCO2 27.3 05/28/2015 1115   O2SAT 93.7 05/28/2015 1115   CBG (last 3)   Recent Labs  05/29/15 0859 05/29/15 1230 05/29/15 1611  GLUCAP 120* 150* 219*    Assessment/Plan: S/P Procedure(s) (LRB): Left Heart Cath and Coronary Angiography (N/A) CABG x3 in am  Procedure , benefits and risks discussed with patient and family   LOS: 3 days    Kathlee Nationseter Van Trigt III 05/29/2015

## 2015-05-29 NOTE — Progress Notes (Signed)
SUBJECTIVE:  No chest pain.  No SOB   PHYSICAL EXAM Filed Vitals:   05/29/15 1100 05/29/15 1130 05/29/15 1143 05/29/15 1425  BP: 106/53 108/56 107/55 124/64  Pulse: 70 70 77 86  Temp:   98.7 F (37.1 C) 98.7 F (37.1 C)  TempSrc:   Oral Oral  Resp: Height:      Weight:   175 lb 4.3 oz (79.5 kg)   SpO2:   96% 96%   General:  No distress Lungs:  Bilateral crackles slightly improved Heart:  RRR Abdomen:  Positive bowel sounds, no rebound no guarding Extremities:  No edema.  LABS:  Results for orders placed or performed during the hospital encounter of 05/26/15 (from the past 24 hour(s))  Glucose, capillary     Status: Abnormal   Collection Time: 05/28/15  4:34 PM  Result Value Ref Range   Glucose-Capillary 149 (H) 65 - 99 mg/dL   Comment 1 Notify RN    Comment 2 Document in Chart   Glucose, capillary     Status: Abnormal   Collection Time: 05/28/15  8:38 PM  Result Value Ref Range   Glucose-Capillary 212 (H) 65 - 99 mg/dL  Surgical pcr screen     Status: None   Collection Time: 05/28/15  9:33 PM  Result Value Ref Range   MRSA, PCR NEGATIVE NEGATIVE   Staphylococcus aureus NEGATIVE NEGATIVE  Glucose, capillary     Status: Abnormal   Collection Time: 05/29/15 12:02 AM  Result Value Ref Range   Glucose-Capillary 194 (H) 65 - 99 mg/dL   Comment 1 Notify RN    Comment 2 Document in Chart   Occult blood card to lab, stool RN will collect     Status: None   Collection Time: 05/29/15  2:54 AM  Result Value Ref Range   Fecal Occult Bld NEGATIVE NEGATIVE  Glucose, capillary     Status: Abnormal   Collection Time: 05/29/15  4:44 AM  Result Value Ref Range   Glucose-Capillary 105 (H) 65 - 99 mg/dL  Heparin level (unfractionated)     Status: Abnormal   Collection Time: 05/29/15  6:04 AM  Result Value Ref Range   Heparin Unfractionated <0.10 (L) 0.30 - 0.70 IU/mL  Renal function panel     Status: Abnormal   Collection Time: 05/29/15  7:59 AM  Result  Value Ref Range   Sodium 144 135 - 145 mmol/L   Potassium 3.1 (L) 3.5 - 5.1 mmol/L   Chloride 101 101 - 111 mmol/L   CO2 27 22 - 32 mmol/L   Glucose, Bld 79 65 - 99 mg/dL   BUN 19 6 - 20 mg/dL   Creatinine, Ser 4.09 (H) 0.61 - 1.24 mg/dL   Calcium 9.0 8.9 - 81.1 mg/dL   Phosphorus 3.0 2.5 - 4.6 mg/dL   Albumin 2.1 (L) 3.5 - 5.0 g/dL   GFR calc non Af Amer 17 (L) >60 mL/min   GFR calc Af Amer 20 (L) >60 mL/min   Anion gap 16 (H) 5 - 15  CBC     Status: Abnormal   Collection Time: 05/29/15  7:59 AM  Result Value Ref Range   WBC 8.0 4.0 - 10.5 K/uL   RBC 2.85 (L) 4.22 - 5.81 MIL/uL   Hemoglobin 8.6 (L) 13.0 - 17.0 g/dL   HCT 91.4 (L) 78.2 - 95.6 %   MCV 92.6 78.0 - 100.0 fL   MCH 30.2 26.0 - 34.0  pg   MCHC 32.6 30.0 - 36.0 g/dL   RDW 16.116.2 (H) 09.611.5 - 04.515.5 %   Platelets 243 150 - 400 K/uL  Glucose, capillary     Status: Abnormal   Collection Time: 05/29/15  8:25 AM  Result Value Ref Range   Glucose-Capillary 59 (L) 65 - 99 mg/dL  Heparin level (unfractionated)     Status: Abnormal   Collection Time: 05/29/15  8:30 AM  Result Value Ref Range   Heparin Unfractionated 0.20 (L) 0.30 - 0.70 IU/mL  Glucose, capillary     Status: Abnormal   Collection Time: 05/29/15  8:59 AM  Result Value Ref Range   Glucose-Capillary 120 (H) 65 - 99 mg/dL  Glucose, capillary     Status: Abnormal   Collection Time: 05/29/15 12:30 PM  Result Value Ref Range   Glucose-Capillary 150 (H) 65 - 99 mg/dL   Comment 1 Notify RN   Prepare RBC (crossmatch)     Status: None   Collection Time: 05/29/15  1:45 PM  Result Value Ref Range   Order Confirmation ORDER PROCESSED BY BLOOD BANK     Intake/Output Summary (Last 24 hours) at 05/29/15 1529 Last data filed at 05/29/15 1330  Gross per 24 hour  Intake    480 ml  Output   3400 ml  Net  -2920 ml    ECHO:  - Left ventricle: The cavity size was normal. Wall thickness was increased in a pattern of mild LVH. Systolic function was moderately to  severely reduced. The estimated ejection fraction was in the range of 30% to 35%. There is severe hypokinesis of the entireinferior myocardium. Doppler parameters are consistent with high ventricular filling pressure. - Mitral valve: There was mild regurgitation.  ASSESSMENT AND PLAN:  NSTEMI:   Cath yesterday with 3 vessel CAD.  Dr. Donata ClayVan Trigt saw the patient today and he is on the schedule for Friday.  CARDIOMYOPATHY:  EF is now found to be low at 30 - 35%.  Unable to titrate meds.  Volume controlled via dialysis.    SYNCOPE/BRADYCARDIA:  Beta blocker discontinued.    HYPOTENSION:   BP is running low.  No bradycardia off beta blocker.  Also off amlodipine.  No clear etiology.   No evidence of pericardial effusion on echo.  No med titration at this point.    ANEMIA:  Likely secondary to renal failure and anemia of chronic disease.  He was transfused .  Guaiac was negative.  HYPOKALEMIA:  Supplement.    Daniel Anthony 05/29/2015 3:29 PM

## 2015-05-29 NOTE — Progress Notes (Signed)
ANTICOAGULATION CONSULT NOTE - Follow Up Consult  Pharmacy Consult for Heparin Indication: Severe CAD awaiting CABG on 10/14  Allergies  Allergen Reactions  . Zosyn [Piperacillin Sod-Tazobactam So] Itching and Rash    Patient Measurements: Height: 5\' 8"  (172.7 cm) Weight: 175 lb 4.3 oz (79.5 kg) (standing weight) IBW/kg (Calculated) : 68.4 Heparin Dosing Weight: 82.4 kg  Vital Signs: Temp: 98.7 F (37.1 C) (10/13 1425) Temp Source: Oral (10/13 1425) BP: 124/64 mmHg (10/13 1425) Pulse Rate: 86 (10/13 1425)  Labs:  Recent Labs  05/27/15 0222  05/28/15 0045  05/29/15 0604 05/29/15 0759 05/29/15 0830 05/29/15 1830  HGB 7.6*  --  8.4*  --   --  8.6*  --   --   HCT 22.8*  --  25.5*  --   --  26.4*  --   --   PLT 203  --   --   --   --  243  --   --   HEPARINUNFRC 0.20*  < > 0.21*  < > <0.10*  --  0.20* 0.18*  CREATININE 8.57*  --   --   --   --  3.45*  --   --   < > = values in this interval not displayed.  Estimated Creatinine Clearance: 20.9 mL/min (by C-G formula based on Cr of 3.45).   Medications:  Heparin @ 1450 units/hr (14.5 ml/hr)  Assessment: 64 YOM who continues on heparin for severe CAD while awaiting CABG planned for 10/14. Heparin level this morning is SUBtherapeutic (HL 0.2, goal of 0.3-0.7). CBC low but stable - no overt s/sx of bleeding noted at this time.  Pt with limited access and hard stick and lab draws only in arm with heparin drip running - turning heparin off then drawing lab - heparin drip 1600 uts/hr HL 0.18 - will continue as pt for OR in am  Goal of Therapy:  Heparin level 0.3-0.7 units/ml Monitor platelets by anticoagulation protocol: Yes   Plan:  1.Continue heparin to 1600 units/hr (16 ml/hr) Plan for heparin to be turned off in am for OR  Leota SauersLisa Cuauhtemoc Huegel Pharm.D. CPP, BCPS Clinical Pharmacist [phone] 05/29/2015 7:05 PM

## 2015-05-29 NOTE — Procedures (Signed)
I was present at this dialysis session. I have reviewed the session itself and made appropriate changes.   Doing well.  Goal UF to EDW.  BP stable.  No c/o.  Remains for CABG tomorrow.  HD schedule afterwards based on clinical circumstances.    Sabra Heckyan Yuji Walth  MD 05/29/2015, 8:03 AM

## 2015-05-29 NOTE — Care Management Important Message (Signed)
Important Message  Patient Details  Name: Daniel Anthony MRN: [id number] Date of Birth: 1950-09-02   Medicare Important Message Given:  Yes-second notification given    Kyla BalzarineShealy, Bayani Renteria Abena 05/29/2015, 10:39 AM

## 2015-05-29 NOTE — Progress Notes (Signed)
ANTICOAGULATION CONSULT NOTE - Follow Up Consult  Pharmacy Consult for Heparin Indication: Severe CAD awaiting CABG on 10/14  Allergies  Allergen Reactions  . Zosyn [Piperacillin Sod-Tazobactam So] Itching and Rash    Patient Measurements: Height: 5\' 8"  (172.7 cm) Weight: 181 lb 10.5 oz (82.4 kg) IBW/kg (Calculated) : 68.4 Heparin Dosing Weight: 82.4 kg  Vital Signs: Temp: 99.4 F (37.4 C) (10/13 0712) Temp Source: Oral (10/13 0712) BP: 114/58 mmHg (10/13 0930) Pulse Rate: 70 (10/13 0930)  Labs:  Recent Labs  05/27/15 0222  05/28/15 0045 05/28/15 0826 05/29/15 0604 05/29/15 0759 05/29/15 0830  HGB 7.6*  --  8.4*  --   --  8.6*  --   HCT 22.8*  --  25.5*  --   --  26.4*  --   PLT 203  --   --   --   --  243  --   HEPARINUNFRC 0.20*  < > 0.21* 0.28* <0.10*  --  0.20*  CREATININE 8.57*  --   --   --   --  3.45*  --   < > = values in this interval not displayed.  Estimated Creatinine Clearance: 22.6 mL/min (by C-G formula based on Cr of 3.45).   Medications:  Heparin @ 1450 units/hr (14.5 ml/hr)  Assessment: 64 YOM who continues on heparin for severe CAD while awaiting CABG planned for 10/14. Heparin level this morning is SUBtherapeutic (HL 0.2, goal of 0.3-0.7). CBC low but stable - no overt s/sx of bleeding noted at this time.   Goal of Therapy:  Heparin level 0.3-0.7 units/ml Monitor platelets by anticoagulation protocol: Yes   Plan:  1. Increase heparin to 1600 units/hr (16 ml/hr) 2. Will continue to monitor for any signs/symptoms of bleeding and will follow up with heparin level in 8 hours   Georgina PillionElizabeth Mj Willis, PharmD, BCPS Clinical Pharmacist Pager: [phone] 05/29/2015 9:47 AM

## 2015-05-29 NOTE — Progress Notes (Signed)
Inpatient Rehabilitation  We have received a prescreen request for possible IP Rehab.  Note pt. has CABG surgery pending tentatively for Friday.    Would suggest to hold on orders for IP Rehab consult until surgery is complete.  Please consider order for IP rehab consult if/when appropriate following CABG.  Thank you.   Weldon PickingSusan Alfrieda Tarry PT Inpatient Rehab Admissions Coordinator Cell [phone] Office [phone]

## 2015-05-29 NOTE — Plan of Care (Signed)
Problem: Phase I Progression Outcomes Goal: Other Phase I Outcomes/Goals Outcome: Progressing Pt has been chest pain free all shift. He is scheduled for CABG in AM.  Pt received ativan for anxiety with good relief noted.

## 2015-05-30 ENCOUNTER — Inpatient Hospital Stay (HOSPITAL_COMMUNITY): Payer: Medicare Other

## 2015-05-30 ENCOUNTER — Encounter (HOSPITAL_COMMUNITY)
Admission: AD | Disposition: A | Discharge status: Home Health | Payer: Medicare Other | Source: Other Acute Inpatient Hospital | Attending: Cardiothoracic Surgery

## 2015-05-30 ENCOUNTER — Inpatient Hospital Stay (HOSPITAL_COMMUNITY): Payer: Medicare Other | Admitting: Anesthesiology

## 2015-05-30 DIAGNOSIS — I25110 Atherosclerotic heart disease of native coronary artery with unstable angina pectoris: Secondary | ICD-10-CM

## 2015-05-30 HISTORY — PX: TEE WITHOUT CARDIOVERSION: SHX5443

## 2015-05-30 HISTORY — PX: CORONARY ARTERY BYPASS GRAFT: SHX141

## 2015-05-30 LAB — CBC
HCT: 29.1 % — ABNORMAL LOW (ref 39.0–52.0)
HCT: 34.1 % — ABNORMAL LOW (ref 39.0–52.0)
HCT: 31.2 % — ABNORMAL LOW (ref 39.0–52.0)
Hemoglobin: 9.5 g/dL — ABNORMAL LOW (ref 13.0–17.0)
Hemoglobin: 11.6 g/dL — ABNORMAL LOW (ref 13.0–17.0)
Hemoglobin: 10.8 g/dL — ABNORMAL LOW (ref 13.0–17.0)
MCH: 30.8 pg (ref 26.0–34.0)
MCH: 30.4 pg (ref 26.0–34.0)
MCH: 31.3 pg (ref 26.0–34.0)
MCHC: 32.6 g/dL (ref 30.0–36.0)
MCHC: 34 g/dL (ref 30.0–36.0)
MCHC: 34.6 g/dL (ref 30.0–36.0)
MCV: 94.5 fL (ref 78.0–100.0)
MCV: 89.5 fL (ref 78.0–100.0)
MCV: 90.4 fL (ref 78.0–100.0)
PLATELETS: 128 10*3/uL — AB (ref 150–400)
Platelets: 285 10*3/uL (ref 150–400)
Platelets: 158 10*3/uL (ref 150–400)
RBC: 3.08 MIL/uL — ABNORMAL LOW (ref 4.22–5.81)
RBC: 3.81 MIL/uL — AB (ref 4.22–5.81)
RBC: 3.45 MIL/uL — ABNORMAL LOW (ref 4.22–5.81)
RDW: 16.2 % — ABNORMAL HIGH (ref 11.5–15.5)
RDW: 15.6 % — AB (ref 11.5–15.5)
RDW: 16.5 % — ABNORMAL HIGH (ref 11.5–15.5)
WBC: 7.6 10*3/uL (ref 4.0–10.5)
WBC: 10.4 10*3/uL (ref 4.0–10.5)
WBC: 9.9 10*3/uL (ref 4.0–10.5)

## 2015-05-30 LAB — POCT I-STAT, CHEM 8
BUN: 25 mg/dL — ABNORMAL HIGH (ref 6–20)
BUN: 23 mg/dL — ABNORMAL HIGH (ref 6–20)
BUN: 24 mg/dL — ABNORMAL HIGH (ref 6–20)
BUN: 23 mg/dL — AB (ref 6–20)
BUN: 23 mg/dL — AB (ref 6–20)
BUN: 26 mg/dL — AB (ref 6–20)
CALCIUM ION: 1.12 mmol/L — AB (ref 1.13–1.30)
CALCIUM ION: 1.11 mmol/L — AB (ref 1.13–1.30)
CALCIUM ION: 1 mmol/L — AB (ref 1.13–1.30)
CALCIUM ION: 1.17 mmol/L (ref 1.13–1.30)
CHLORIDE: 98 mmol/L — AB (ref 101–111)
CHLORIDE: 95 mmol/L — AB (ref 101–111)
CHLORIDE: 100 mmol/L — AB (ref 101–111)
CREATININE: 3.9 mg/dL — AB (ref 0.61–1.24)
Calcium, Ion: 0.95 mmol/L — ABNORMAL LOW (ref 1.13–1.30)
Calcium, Ion: 1.16 mmol/L (ref 1.13–1.30)
Chloride: 95 mmol/L — ABNORMAL LOW (ref 101–111)
Chloride: 99 mmol/L — ABNORMAL LOW (ref 101–111)
Chloride: 99 mmol/L — ABNORMAL LOW (ref 101–111)
Creatinine, Ser: 4.5 mg/dL — ABNORMAL HIGH (ref 0.61–1.24)
Creatinine, Ser: 4.4 mg/dL — ABNORMAL HIGH (ref 0.61–1.24)
Creatinine, Ser: 4.1 mg/dL — ABNORMAL HIGH (ref 0.61–1.24)
Creatinine, Ser: 3.9 mg/dL — ABNORMAL HIGH (ref 0.61–1.24)
Creatinine, Ser: 4.1 mg/dL — ABNORMAL HIGH (ref 0.61–1.24)
GLUCOSE: 125 mg/dL — AB (ref 65–99)
GLUCOSE: 113 mg/dL — AB (ref 65–99)
GLUCOSE: 90 mg/dL (ref 65–99)
GLUCOSE: 95 mg/dL (ref 65–99)
Glucose, Bld: 93 mg/dL (ref 65–99)
Glucose, Bld: 151 mg/dL — ABNORMAL HIGH (ref 65–99)
HCT: 26 % — ABNORMAL LOW (ref 39.0–52.0)
HCT: 24 % — ABNORMAL LOW (ref 39.0–52.0)
HCT: 24 % — ABNORMAL LOW (ref 39.0–52.0)
HCT: 32 % — ABNORMAL LOW (ref 39.0–52.0)
HCT: 32 % — ABNORMAL LOW (ref 39.0–52.0)
HEMATOCRIT: 22 % — AB (ref 39.0–52.0)
HEMOGLOBIN: 8.8 g/dL — AB (ref 13.0–17.0)
HEMOGLOBIN: 7.5 g/dL — AB (ref 13.0–17.0)
HEMOGLOBIN: 8.2 g/dL — AB (ref 13.0–17.0)
Hemoglobin: 8.2 g/dL — ABNORMAL LOW (ref 13.0–17.0)
Hemoglobin: 10.9 g/dL — ABNORMAL LOW (ref 13.0–17.0)
Hemoglobin: 10.9 g/dL — ABNORMAL LOW (ref 13.0–17.0)
POTASSIUM: 3.8 mmol/L (ref 3.5–5.1)
Potassium: 4.3 mmol/L (ref 3.5–5.1)
Potassium: 3 mmol/L — ABNORMAL LOW (ref 3.5–5.1)
Potassium: 3.2 mmol/L — ABNORMAL LOW (ref 3.5–5.1)
Potassium: 3.4 mmol/L — ABNORMAL LOW (ref 3.5–5.1)
Potassium: 4 mmol/L (ref 3.5–5.1)
SODIUM: 134 mmol/L — AB (ref 135–145)
SODIUM: 135 mmol/L (ref 135–145)
SODIUM: 132 mmol/L — AB (ref 135–145)
Sodium: 136 mmol/L (ref 135–145)
Sodium: 133 mmol/L — ABNORMAL LOW (ref 135–145)
Sodium: 135 mmol/L (ref 135–145)
TCO2: 27 mmol/L (ref 0–100)
TCO2: 26 mmol/L (ref 0–100)
TCO2: 26 mmol/L (ref 0–100)
TCO2: 26 mmol/L (ref 0–100)
TCO2: 24 mmol/L (ref 0–100)
TCO2: 22 mmol/L (ref 0–100)

## 2015-05-30 LAB — POCT I-STAT 3, ART BLOOD GAS (G3+)
ACID-BASE EXCESS: 5 mmol/L — AB (ref 0.0–2.0)
ACID-BASE EXCESS: 1 mmol/L (ref 0.0–2.0)
Acid-base deficit: 1 mmol/L (ref 0.0–2.0)
Acid-base deficit: 2 mmol/L (ref 0.0–2.0)
BICARBONATE: 26.5 meq/L — AB (ref 20.0–24.0)
BICARBONATE: 24.1 meq/L — AB (ref 20.0–24.0)
Bicarbonate: 28.6 mEq/L — ABNORMAL HIGH (ref 20.0–24.0)
Bicarbonate: 25.3 mEq/L — ABNORMAL HIGH (ref 20.0–24.0)
Bicarbonate: 22.8 mEq/L (ref 20.0–24.0)
O2 SAT: 100 %
O2 SAT: 97 %
O2 SAT: 98 %
O2 Saturation: 100 %
O2 Saturation: 99 %
PCO2 ART: 38 mmHg (ref 35.0–45.0)
PCO2 ART: 41.6 mmHg (ref 35.0–45.0)
PCO2 ART: 34.3 mmHg — AB (ref 35.0–45.0)
PCO2 ART: 43.9 mmHg (ref 35.0–45.0)
PCO2 ART: 45.4 mmHg — AB (ref 35.0–45.0)
PH ART: 7.485 — AB (ref 7.350–7.450)
PH ART: 7.428 (ref 7.350–7.450)
PH ART: 7.388 (ref 7.350–7.450)
PH ART: 7.33 — AB (ref 7.350–7.450)
PO2 ART: 388 mmHg — AB (ref 80.0–100.0)
PO2 ART: 132 mmHg — AB (ref 80.0–100.0)
PO2 ART: 111 mmHg — AB (ref 80.0–100.0)
Patient temperature: 36.6
Patient temperature: 36.3
TCO2: 30 mmol/L (ref 0–100)
TCO2: 27 mmol/L (ref 0–100)
TCO2: 24 mmol/L (ref 0–100)
TCO2: 28 mmol/L (ref 0–100)
TCO2: 26 mmol/L (ref 0–100)
pH, Arterial: 7.392 (ref 7.350–7.450)
pO2, Arterial: 410 mmHg — ABNORMAL HIGH (ref 80.0–100.0)
pO2, Arterial: 89 mmHg (ref 80.0–100.0)

## 2015-05-30 LAB — POCT I-STAT 4, (NA,K, GLUC, HGB,HCT)
GLUCOSE: 167 mg/dL — AB (ref 65–99)
HEMATOCRIT: 34 % — AB (ref 39.0–52.0)
Hemoglobin: 11.6 g/dL — ABNORMAL LOW (ref 13.0–17.0)
Potassium: 3.6 mmol/L (ref 3.5–5.1)
Sodium: 134 mmol/L — ABNORMAL LOW (ref 135–145)

## 2015-05-30 LAB — BASIC METABOLIC PANEL
Anion gap: 15 (ref 5–15)
BUN: 25 mg/dL — ABNORMAL HIGH (ref 6–20)
CO2: 29 mmol/L (ref 22–32)
Calcium: 9.2 mg/dL (ref 8.9–10.3)
Chloride: 92 mmol/L — ABNORMAL LOW (ref 101–111)
Creatinine, Ser: 4.75 mg/dL — ABNORMAL HIGH (ref 0.61–1.24)
GFR calc Af Amer: 14 mL/min — ABNORMAL LOW (ref >60)
GFR calc non Af Amer: 12 mL/min — ABNORMAL LOW (ref >60)
Glucose, Bld: 240 mg/dL — ABNORMAL HIGH (ref 65–99)
Potassium: 4.1 mmol/L (ref 3.5–5.1)
Sodium: 136 mmol/L (ref 135–145)

## 2015-05-30 LAB — APTT: aPTT: 34 seconds (ref 24–37)

## 2015-05-30 LAB — PROTIME-INR
INR: 1.3 (ref 0.00–1.49)
PROTHROMBIN TIME: 16.3 s — AB (ref 11.6–15.2)

## 2015-05-30 LAB — CREATININE, SERUM
Creatinine, Ser: 4.51 mg/dL — ABNORMAL HIGH (ref 0.61–1.24)
GFR calc Af Amer: 15 mL/min — ABNORMAL LOW (ref >60)
GFR calc non Af Amer: 13 mL/min — ABNORMAL LOW (ref >60)

## 2015-05-30 LAB — HEPARIN LEVEL (UNFRACTIONATED)

## 2015-05-30 LAB — PLATELET COUNT: Platelets: 133 10*3/uL — ABNORMAL LOW (ref 150–400)

## 2015-05-30 LAB — MAGNESIUM: Magnesium: 2.4 mg/dL (ref 1.7–2.4)

## 2015-05-30 LAB — HEMOGLOBIN AND HEMATOCRIT, BLOOD
HCT: 22.6 % — ABNORMAL LOW (ref 39.0–52.0)
Hemoglobin: 7.6 g/dL — ABNORMAL LOW (ref 13.0–17.0)

## 2015-05-30 LAB — GLUCOSE, CAPILLARY
Glucose-Capillary: 221 mg/dL — ABNORMAL HIGH (ref 65–99)
Glucose-Capillary: 299 mg/dL — ABNORMAL HIGH (ref 65–99)

## 2015-05-30 LAB — PREPARE RBC (CROSSMATCH)

## 2015-05-30 SURGERY — CORONARY ARTERY BYPASS GRAFTING (CABG)
Anesthesia: General | Site: Chest

## 2015-05-30 MED ORDER — SODIUM CHLORIDE 0.9 % IJ SOLN
3.0000 mL | Freq: Two times a day (BID) | INTRAMUSCULAR | Status: DC
  Administered 2015-05-31 – 2015-06-08 (×14): 3 mL via INTRAVENOUS

## 2015-05-30 MED ORDER — POTASSIUM CHLORIDE 10 MEQ/50ML IV SOLN: 10.0000 meq | INTRAVENOUS | Status: AC

## 2015-05-30 MED ORDER — MIDAZOLAM HCL 5 MG/5ML IJ SOLN
INTRAMUSCULAR | Status: DC | PRN
  Administered 2015-05-30 (×2): 2 mg via INTRAVENOUS
  Administered 2015-05-30: 5 mg via INTRAVENOUS
  Administered 2015-05-30 (×2): 3 mg via INTRAVENOUS
  Administered 2015-05-30: 5 mg via INTRAVENOUS

## 2015-05-30 MED ORDER — ONDANSETRON HCL 4 MG/2ML IJ SOLN: 4.0000 mg | Freq: Four times a day (QID) | INTRAMUSCULAR | Status: DC | PRN

## 2015-05-30 MED ORDER — DOCUSATE SODIUM 100 MG PO CAPS
200.0000 mg | ORAL_CAPSULE | Freq: Every day | ORAL | Status: DC
  Administered 2015-05-31 – 2015-06-01 (×2): 200 mg via ORAL
  Filled 2015-05-30 (×6): qty 2

## 2015-05-30 MED ORDER — FENTANYL CITRATE (PF) 250 MCG/5ML IJ SOLN
INTRAMUSCULAR | Status: AC
  Filled 2015-05-30: qty 5

## 2015-05-30 MED ORDER — HEPARIN SODIUM (PORCINE) 1000 UNIT/ML IJ SOLN
INTRAMUSCULAR | Status: AC
  Filled 2015-05-30: qty 1

## 2015-05-30 MED ORDER — ROCURONIUM BROMIDE 100 MG/10ML IV SOLN
INTRAVENOUS | Status: DC | PRN
  Administered 2015-05-30: 50 mg via INTRAVENOUS

## 2015-05-30 MED ORDER — SUCCINYLCHOLINE CHLORIDE 20 MG/ML IJ SOLN
INTRAMUSCULAR | Status: DC | PRN
  Administered 2015-05-30: 100 mg via INTRAVENOUS

## 2015-05-30 MED ORDER — MILRINONE IN DEXTROSE 20 MG/100ML IV SOLN
0.1250 ug/kg/min | INTRAVENOUS | Status: AC
  Administered 2015-05-30: .3 ug/kg/min via INTRAVENOUS
  Filled 2015-05-30: qty 100

## 2015-05-30 MED ORDER — EPINEPHRINE HCL 1 MG/ML IJ SOLN
0.0000 ug/min | INTRAVENOUS | Status: DC
  Filled 2015-05-30: qty 4

## 2015-05-30 MED ORDER — ACETAMINOPHEN 160 MG/5ML PO SOLN: 1000.0000 mg | Freq: Four times a day (QID) | ORAL | Status: AC

## 2015-05-30 MED ORDER — METOPROLOL TARTRATE 12.5 MG HALF TABLET
12.5000 mg | ORAL_TABLET | Freq: Two times a day (BID) | ORAL | Status: DC
  Filled 2015-05-30 (×3): qty 1

## 2015-05-30 MED ORDER — DEXMEDETOMIDINE HCL IN NACL 200 MCG/50ML IV SOLN
0.0000 ug/kg/h | INTRAVENOUS | Status: DC
  Administered 2015-05-30: 0.1 ug/kg/h via INTRAVENOUS
  Filled 2015-05-30: qty 50

## 2015-05-30 MED ORDER — LACTATED RINGERS IV SOLN: 500.0000 mL | Freq: Once | INTRAVENOUS | Status: DC | PRN

## 2015-05-30 MED ORDER — MIDAZOLAM HCL 10 MG/2ML IJ SOLN
INTRAMUSCULAR | Status: AC
  Filled 2015-05-30: qty 4

## 2015-05-30 MED ORDER — SODIUM CHLORIDE 0.9 % IV SOLN
INTRAVENOUS | Status: DC
  Filled 2015-05-30 (×2): qty 2.5

## 2015-05-30 MED ORDER — ROSUVASTATIN CALCIUM 40 MG PO TABS
40.0000 mg | ORAL_TABLET | Freq: Every day | ORAL | Status: DC
  Administered 2015-05-30 – 2015-06-06 (×7): 40 mg via ORAL
  Filled 2015-05-30 (×13): qty 1

## 2015-05-30 MED ORDER — VANCOMYCIN HCL IN DEXTROSE 1-5 GM/200ML-% IV SOLN: 1000.0000 mg | Freq: Once | INTRAVENOUS | Status: DC

## 2015-05-30 MED ORDER — ANTISEPTIC ORAL RINSE SOLUTION (CORINZ)
7.0000 mL | Freq: Four times a day (QID) | OROMUCOSAL | Status: DC
  Administered 2015-05-31: 7 mL via OROMUCOSAL

## 2015-05-30 MED ORDER — BISACODYL 5 MG PO TBEC
10.0000 mg | DELAYED_RELEASE_TABLET | Freq: Every day | ORAL | Status: DC
  Administered 2015-05-31 – 2015-06-01 (×2): 10 mg via ORAL
  Filled 2015-05-30 (×6): qty 2

## 2015-05-30 MED ORDER — CEFUROXIME SODIUM 1.5 G IJ SOLR: 1.5000 g | Freq: Two times a day (BID) | INTRAMUSCULAR | Status: DC

## 2015-05-30 MED ORDER — METOPROLOL TARTRATE 1 MG/ML IV SOLN: 2.5000 mg | INTRAVENOUS | Status: DC | PRN

## 2015-05-30 MED ORDER — FAMOTIDINE IN NACL 20-0.9 MG/50ML-% IV SOLN
20.0000 mg | INTRAVENOUS | Status: AC
  Administered 2015-05-30: 20 mg via INTRAVENOUS

## 2015-05-30 MED ORDER — SODIUM CHLORIDE 0.9 % IV SOLN: INTRAVENOUS | Status: DC

## 2015-05-30 MED ORDER — ASPIRIN 81 MG PO CHEW
324.0000 mg | CHEWABLE_TABLET | Freq: Every day | ORAL | Status: DC
  Filled 2015-05-30: qty 4

## 2015-05-30 MED ORDER — ARTIFICIAL TEARS OP OINT
TOPICAL_OINTMENT | OPHTHALMIC | Status: DC | PRN
  Administered 2015-05-30: 1 via OPHTHALMIC

## 2015-05-30 MED ORDER — OXYCODONE HCL 5 MG PO TABS
5.0000 mg | ORAL_TABLET | ORAL | Status: DC | PRN
  Administered 2015-05-31: 10 mg via ORAL
  Administered 2015-05-31: 5 mg via ORAL
  Administered 2015-05-31 (×2): 10 mg via ORAL
  Filled 2015-05-30: qty 2
  Filled 2015-05-30: qty 1
  Filled 2015-05-30 (×2): qty 2

## 2015-05-30 MED ORDER — ACETAMINOPHEN 650 MG RE SUPP
650.0000 mg | Freq: Once | RECTAL | Status: AC
  Administered 2015-05-30: 650 mg via RECTAL

## 2015-05-30 MED ORDER — PANTOPRAZOLE SODIUM 40 MG PO TBEC
40.0000 mg | DELAYED_RELEASE_TABLET | Freq: Every day | ORAL | Status: DC
  Administered 2015-06-01 – 2015-06-09 (×9): 40 mg via ORAL
  Filled 2015-05-30 (×9): qty 1

## 2015-05-30 MED ORDER — SODIUM CHLORIDE 0.9 % IV SOLN: 250.0000 mL | INTRAVENOUS | Status: DC

## 2015-05-30 MED ORDER — MORPHINE SULFATE (PF) 2 MG/ML IV SOLN
2.0000 mg | INTRAVENOUS | Status: DC | PRN
  Administered 2015-05-30 (×3): 2 mg via INTRAVENOUS
  Administered 2015-06-02 (×3): 5 mg via INTRAVENOUS
  Filled 2015-05-30 (×3): qty 3
  Filled 2015-05-30 (×2): qty 1
  Filled 2015-05-30: qty 3
  Filled 2015-05-30: qty 1

## 2015-05-30 MED ORDER — FAMOTIDINE IN NACL 20-0.9 MG/50ML-% IV SOLN: 20.0000 mg | Freq: Two times a day (BID) | INTRAVENOUS | Status: DC

## 2015-05-30 MED ORDER — PROTAMINE SULFATE 10 MG/ML IV SOLN
INTRAVENOUS | Status: AC
  Filled 2015-05-30: qty 25

## 2015-05-30 MED ORDER — METOPROLOL TARTRATE 25 MG/10 ML ORAL SUSPENSION
12.5000 mg | Freq: Two times a day (BID) | ORAL | Status: DC
  Filled 2015-05-30 (×3): qty 5

## 2015-05-30 MED ORDER — PHENYLEPHRINE HCL 10 MG/ML IJ SOLN
0.0000 ug/min | INTRAVENOUS | Status: DC
  Filled 2015-05-30: qty 2

## 2015-05-30 MED ORDER — TRAMADOL HCL 50 MG PO TABS: 50.0000 mg | ORAL_TABLET | ORAL | Status: DC | PRN

## 2015-05-30 MED ORDER — SODIUM CHLORIDE 0.9 % IJ SOLN
3.0000 mL | INTRAMUSCULAR | Status: DC | PRN
  Administered 2015-05-31: 3 mL via INTRAVENOUS
  Filled 2015-05-30: qty 3

## 2015-05-30 MED ORDER — 0.9 % SODIUM CHLORIDE (POUR BTL) OPTIME
TOPICAL | Status: DC | PRN
  Administered 2015-05-30: 4000 mL

## 2015-05-30 MED ORDER — MORPHINE SULFATE (PF) 2 MG/ML IV SOLN: 1.0000 mg | INTRAVENOUS | Status: AC | PRN

## 2015-05-30 MED ORDER — FENTANYL CITRATE (PF) 100 MCG/2ML IJ SOLN
INTRAMUSCULAR | Status: DC | PRN
  Administered 2015-05-30: 250 ug via INTRAVENOUS
  Administered 2015-05-30: 400 ug via INTRAVENOUS
  Administered 2015-05-30 (×2): 250 ug via INTRAVENOUS
  Administered 2015-05-30: 100 ug via INTRAVENOUS
  Administered 2015-05-30 (×2): 250 ug via INTRAVENOUS

## 2015-05-30 MED ORDER — HEMOSTATIC AGENTS (NO CHARGE) OPTIME
TOPICAL | Status: DC | PRN
  Administered 2015-05-30 (×5): 1 via TOPICAL

## 2015-05-30 MED ORDER — LACTATED RINGERS IV SOLN: INTRAVENOUS | Status: DC

## 2015-05-30 MED ORDER — MILRINONE IN DEXTROSE 20 MG/100ML IV SOLN
0.3000 ug/kg/min | INTRAVENOUS | Status: DC
  Administered 2015-05-30 – 2015-05-31 (×2): 0.3 ug/kg/min via INTRAVENOUS
  Filled 2015-05-30 (×2): qty 100

## 2015-05-30 MED ORDER — SODIUM CHLORIDE 0.45 % IV SOLN
INTRAVENOUS | Status: DC | PRN
  Administered 2015-05-30: 20 mL/h via INTRAVENOUS

## 2015-05-30 MED ORDER — SODIUM CHLORIDE 0.9 % IV SOLN: 10.0000 mL/h | Freq: Once | INTRAVENOUS | Status: DC

## 2015-05-30 MED ORDER — VECURONIUM BROMIDE 10 MG IV SOLR
INTRAVENOUS | Status: DC | PRN
  Administered 2015-05-30 (×2): 5 mg via INTRAVENOUS

## 2015-05-30 MED ORDER — ALBUMIN HUMAN 5 % IV SOLN
250.0000 mL | INTRAVENOUS | Status: AC | PRN
  Administered 2015-05-30: 250 mL via INTRAVENOUS

## 2015-05-30 MED ORDER — INFLUENZA VAC SPLIT QUAD 0.5 ML IM SUSY
0.5000 mL | PREFILLED_SYRINGE | INTRAMUSCULAR | Status: DC
  Filled 2015-05-30: qty 0.5

## 2015-05-30 MED ORDER — ASPIRIN EC 325 MG PO TBEC
325.0000 mg | DELAYED_RELEASE_TABLET | Freq: Every day | ORAL | Status: DC
  Administered 2015-05-31 – 2015-06-07 (×8): 325 mg via ORAL
  Filled 2015-05-30 (×9): qty 1

## 2015-05-30 MED ORDER — DEXTROSE 5 % IV SOLN
1.5000 g | INTRAVENOUS | Status: AC
  Administered 2015-05-31 – 2015-06-01 (×2): 1.5 g via INTRAVENOUS
  Filled 2015-05-30 (×2): qty 1.5

## 2015-05-30 MED ORDER — ACETAMINOPHEN 500 MG PO TABS
1000.0000 mg | ORAL_TABLET | Freq: Four times a day (QID) | ORAL | Status: AC
  Administered 2015-05-31 – 2015-06-04 (×13): 1000 mg via ORAL
  Filled 2015-05-30 (×18): qty 2

## 2015-05-30 MED ORDER — MIDAZOLAM HCL 2 MG/2ML IJ SOLN: 2.0000 mg | INTRAMUSCULAR | Status: DC | PRN

## 2015-05-30 MED ORDER — NOREPINEPHRINE BITARTRATE 1 MG/ML IV SOLN
0.0000 ug/min | INTRAVENOUS | Status: AC
  Administered 2015-05-30: 3 ug/min via INTRAVENOUS
  Filled 2015-05-30: qty 4

## 2015-05-30 MED ORDER — ACETAMINOPHEN 160 MG/5ML PO SOLN: 650.0000 mg | Freq: Once | ORAL | Status: AC

## 2015-05-30 MED ORDER — PROPOFOL 10 MG/ML IV BOLUS
INTRAVENOUS | Status: DC | PRN
  Administered 2015-05-30: 50 mg via INTRAVENOUS

## 2015-05-30 MED ORDER — BISACODYL 10 MG RE SUPP: 10.0000 mg | Freq: Every day | RECTAL | Status: DC

## 2015-05-30 MED ORDER — NOREPINEPHRINE BITARTRATE 1 MG/ML IV SOLN
0.0000 ug/min | INTRAVENOUS | Status: DC
  Filled 2015-05-30: qty 4

## 2015-05-30 MED ORDER — CHLORHEXIDINE GLUCONATE 0.12% ORAL RINSE (MEDLINE KIT)
15.0000 mL | Freq: Two times a day (BID) | OROMUCOSAL | Status: DC
  Administered 2015-05-30: 15 mL via OROMUCOSAL

## 2015-05-30 MED ORDER — NITROGLYCERIN IN D5W 200-5 MCG/ML-% IV SOLN: 0.0000 ug/min | INTRAVENOUS | Status: DC

## 2015-05-30 MED ORDER — TRAMADOL HCL 50 MG PO TABS: 50.0000 mg | ORAL_TABLET | Freq: Two times a day (BID) | ORAL | Status: DC | PRN

## 2015-05-30 MED ORDER — PROPOFOL 10 MG/ML IV BOLUS
INTRAVENOUS | Status: AC
  Filled 2015-05-30: qty 20

## 2015-05-30 MED ORDER — HEPARIN SODIUM (PORCINE) 1000 UNIT/ML IJ SOLN
INTRAMUSCULAR | Status: DC | PRN
  Administered 2015-05-30: 21000 [IU] via INTRAVENOUS
  Administered 2015-05-30: 2000 [IU] via INTRAVENOUS
  Administered 2015-05-30: 4000 [IU] via INTRAVENOUS

## 2015-05-30 MED ORDER — INSULIN REGULAR BOLUS VIA INFUSION
0.0000 [IU] | Freq: Three times a day (TID) | INTRAVENOUS | Status: DC
  Filled 2015-05-30: qty 10

## 2015-05-30 MED FILL — Calcium Chloride Inj 10%: INTRAVENOUS | Qty: 10 | Status: AC

## 2015-05-30 MED FILL — Heparin Sodium (Porcine) Inj 1000 Unit/ML: INTRAMUSCULAR | Qty: 10 | Status: AC

## 2015-05-30 MED FILL — Sodium Chloride IV Soln 0.9%: INTRAVENOUS | Qty: 3000 | Status: AC

## 2015-05-30 MED FILL — Mannitol IV Soln 20%: INTRAVENOUS | Qty: 500 | Status: AC

## 2015-05-30 MED FILL — Electrolyte-R (PH 7.4) Solution: INTRAVENOUS | Qty: 4000 | Status: AC

## 2015-05-30 MED FILL — Lidocaine HCl IV Inj 20 MG/ML: INTRAVENOUS | Qty: 5 | Status: AC

## 2015-05-30 MED FILL — Sodium Bicarbonate IV Soln 8.4%: INTRAVENOUS | Qty: 50 | Status: AC

## 2015-05-30 SURGICAL SUPPLY — 99 items
ADAPTER CARDIO PERF ANTE/RETRO (ADAPTER) ×4 IMPLANT
ADPR PRFSN 84XANTGRD RTRGD (ADAPTER) ×2
BAG DECANTER FOR FLEXI CONT (MISCELLANEOUS) ×4 IMPLANT
BANDAGE ELASTIC 4 VELCRO ST LF (GAUZE/BANDAGES/DRESSINGS) ×4 IMPLANT
BANDAGE ELASTIC 6 VELCRO ST LF (GAUZE/BANDAGES/DRESSINGS) ×4 IMPLANT
BASKET HEART  (ORDER IN 25'S) (MISCELLANEOUS) ×1
BASKET HEART (ORDER IN 25'S) (MISCELLANEOUS) ×1
BASKET HEART (ORDER IN 25S) (MISCELLANEOUS) ×2 IMPLANT
BLADE STERNUM SYSTEM 6 (BLADE) ×4 IMPLANT
BLADE SURG 11 STRL SS (BLADE) ×6 IMPLANT
BLADE SURG 12 STRL SS (BLADE) ×4 IMPLANT
BLADE SURG ROTATE 9660 (MISCELLANEOUS) IMPLANT
BNDG GAUZE ELAST 4 BULKY (GAUZE/BANDAGES/DRESSINGS) ×4 IMPLANT
CANISTER SUCTION 2500CC (MISCELLANEOUS) ×4 IMPLANT
CANNULA GUNDRY RCSP 15FR (MISCELLANEOUS) ×4 IMPLANT
CATH CPB KIT VANTRIGT (MISCELLANEOUS) ×4 IMPLANT
CATH ROBINSON RED A/P 18FR (CATHETERS) ×12 IMPLANT
CATH THORACIC 36FR RT ANG (CATHETERS) ×4 IMPLANT
CLIP TI WIDE RED SMALL 24 (CLIP) ×2 IMPLANT
COVER SURGICAL LIGHT HANDLE (MISCELLANEOUS) ×4 IMPLANT
CRADLE DONUT ADULT HEAD (MISCELLANEOUS) ×4 IMPLANT
DRAIN CHANNEL 32F RND 10.7 FF (WOUND CARE) ×4 IMPLANT
DRAPE CARDIOVASCULAR INCISE (DRAPES) ×4
DRAPE SLUSH/WARMER DISC (DRAPES) ×4 IMPLANT
DRAPE SRG 135X102X78XABS (DRAPES) ×2 IMPLANT
DRSG AQUACEL AG ADV 3.5X14 (GAUZE/BANDAGES/DRESSINGS) ×7 IMPLANT
ELECT BLADE 4.0 EZ CLEAN MEGAD (MISCELLANEOUS) ×4
ELECT BLADE 6.5 EXT (BLADE) ×4 IMPLANT
ELECT CAUTERY BLADE 6.4 (BLADE) ×4 IMPLANT
ELECT REM PT RETURN 9FT ADLT (ELECTROSURGICAL) ×8
ELECTRODE BLDE 4.0 EZ CLN MEGD (MISCELLANEOUS) ×2 IMPLANT
ELECTRODE REM PT RTRN 9FT ADLT (ELECTROSURGICAL) ×4 IMPLANT
GAUZE SPONGE 4X4 12PLY STRL (GAUZE/BANDAGES/DRESSINGS) ×8 IMPLANT
GLOVE BIO SURGEON STRL SZ7.5 (GLOVE) ×12 IMPLANT
GOWN STRL REUS W/ TWL LRG LVL3 (GOWN DISPOSABLE) ×8 IMPLANT
GOWN STRL REUS W/TWL LRG LVL3 (GOWN DISPOSABLE) ×16
HEMOSTAT POWDER SURGIFOAM 1G (HEMOSTASIS) ×12 IMPLANT
HEMOSTAT SURGICEL 2X14 (HEMOSTASIS) ×4 IMPLANT
INSERT FOGARTY XLG (MISCELLANEOUS) IMPLANT
KIT BASIN OR (CUSTOM PROCEDURE TRAY) ×4 IMPLANT
KIT CATH LUMEN 3 7FR 30CM STRL (SET/KITS/TRAYS/PACK) ×2 IMPLANT
KIT ROOM TURNOVER OR (KITS) ×4 IMPLANT
KIT SUCTION CATH 14FR (SUCTIONS) ×4 IMPLANT
KIT VASOVIEW W/TROCAR VH 2000 (KITS) ×4 IMPLANT
LEAD PACING MYOCARDI (MISCELLANEOUS) ×4 IMPLANT
MARKER GRAFT CORONARY BYPASS (MISCELLANEOUS) ×12 IMPLANT
NS IRRIG 1000ML POUR BTL (IV SOLUTION) ×20 IMPLANT
PACK OPEN HEART (CUSTOM PROCEDURE TRAY) ×4 IMPLANT
PAD ARMBOARD 7.5X6 YLW CONV (MISCELLANEOUS) ×8 IMPLANT
PAD ELECT DEFIB RADIOL ZOLL (MISCELLANEOUS) ×4 IMPLANT
PENCIL BUTTON HOLSTER BLD 10FT (ELECTRODE) ×4 IMPLANT
PUNCH AORTIC ROTATE  4.5MM 8IN (MISCELLANEOUS) ×3 IMPLANT
PUNCH AORTIC ROTATE 4.0MM (MISCELLANEOUS) IMPLANT
PUNCH AORTIC ROTATE 4.5MM 8IN (MISCELLANEOUS) IMPLANT
PUNCH AORTIC ROTATE 5MM 8IN (MISCELLANEOUS) IMPLANT
SET CARDIOPLEGIA MPS 5001102 (MISCELLANEOUS) ×2 IMPLANT
SPONGE GAUZE 4X4 12PLY STER LF (GAUZE/BANDAGES/DRESSINGS) ×6 IMPLANT
SURGIFLO W/THROMBIN 8M KIT (HEMOSTASIS) ×4 IMPLANT
SUT BONE WAX W31G (SUTURE) ×4 IMPLANT
SUT ETHIBOND 2 0 SH (SUTURE) ×16
SUT ETHIBOND 2 0 SH 36X2 (SUTURE) ×4 IMPLANT
SUT MNCRL AB 4-0 PS2 18 (SUTURE) ×2 IMPLANT
SUT PROLENE 3 0 SH DA (SUTURE) IMPLANT
SUT PROLENE 3 0 SH1 36 (SUTURE) IMPLANT
SUT PROLENE 4 0 RB 1 (SUTURE) ×4
SUT PROLENE 4 0 SH DA (SUTURE) ×4 IMPLANT
SUT PROLENE 4-0 RB1 .5 CRCL 36 (SUTURE) ×2 IMPLANT
SUT PROLENE 5 0 C 1 36 (SUTURE) ×4 IMPLANT
SUT PROLENE 6 0 C 1 30 (SUTURE) ×8 IMPLANT
SUT PROLENE 6 0 CC (SUTURE) ×12 IMPLANT
SUT PROLENE 8 0 BV175 6 (SUTURE) ×6 IMPLANT
SUT PROLENE BLUE 7 0 (SUTURE) ×4 IMPLANT
SUT SILK  1 MH (SUTURE) ×6
SUT SILK 1 MH (SUTURE) IMPLANT
SUT SILK 1 TIES 10X30 (SUTURE) ×2 IMPLANT
SUT SILK 2 0 SH CR/8 (SUTURE) ×6 IMPLANT
SUT SILK 2 0 TIES 10X30 (SUTURE) ×3 IMPLANT
SUT SILK 2 0 TIES 17X18 (SUTURE) ×4
SUT SILK 2-0 18XBRD TIE BLK (SUTURE) IMPLANT
SUT SILK 3 0 SH CR/8 (SUTURE) ×3 IMPLANT
SUT SILK 4 0 TIE 10X30 (SUTURE) ×4 IMPLANT
SUT STEEL 6MS V (SUTURE) ×8 IMPLANT
SUT STEEL SZ 6 DBL 3X14 BALL (SUTURE) ×4 IMPLANT
SUT TEM PAC WIRE 2 0 SH (SUTURE) ×12 IMPLANT
SUT VIC AB 1 CTX 36 (SUTURE) ×8
SUT VIC AB 1 CTX36XBRD ANBCTR (SUTURE) ×4 IMPLANT
SUT VIC AB 2-0 CT1 27 (SUTURE) ×4
SUT VIC AB 2-0 CT1 TAPERPNT 27 (SUTURE) ×2 IMPLANT
SUT VIC AB 2-0 CTX 27 (SUTURE) ×4 IMPLANT
SUT VIC AB 3-0 X1 27 (SUTURE) ×4 IMPLANT
SUTURE E-PAK OPEN HEART (SUTURE) ×4 IMPLANT
SYSTEM SAHARA CHEST DRAIN ATS (WOUND CARE) ×4 IMPLANT
TAPE CLOTH SURG 6X10 WHT LF (GAUZE/BANDAGES/DRESSINGS) ×2 IMPLANT
TOWEL OR 17X24 6PK STRL BLUE (TOWEL DISPOSABLE) ×8 IMPLANT
TOWEL OR 17X26 10 PK STRL BLUE (TOWEL DISPOSABLE) ×8 IMPLANT
TRAY FOLEY IC TEMP SENS 16FR (CATHETERS) ×4 IMPLANT
TUBING INSUFFLATION (TUBING) ×4 IMPLANT
UNDERPAD 30X30 INCONTINENT (UNDERPADS AND DIAPERS) ×4 IMPLANT
WATER STERILE IRR 1000ML POUR (IV SOLUTION) ×8 IMPLANT

## 2015-05-30 NOTE — Progress Notes (Signed)
The patient was examined and preop studies reviewed. There has been no change from the prior exam and the patient is ready for surgery.  plan CABG on R Fayrene FearingJames

## 2015-05-30 NOTE — Progress Notes (Signed)
nif -20, fvc 900l.

## 2015-05-30 NOTE — Progress Notes (Signed)
Pt had 18 beat run VT, converted on own back to AAI @ 90.  Pt asymptomatic.  Last K 3.8, Mag 2.4.  Dr. Dorris FetchHendrickson notified.  No new orders.  Will continue to monitor.  Roselie AwkwardShannon Kaileb Monsanto, RN

## 2015-05-30 NOTE — Anesthesia Procedure Notes (Addendum)
Procedure Name: Intubation Date/Time: 05/30/2015 7:53 AM Performed by: Wray KearnsFOLEY, Siena Poehler A Pre-anesthesia Checklist: Patient identified, Emergency Drugs available, Suction available, Patient being monitored and Timeout performed Patient Re-evaluated:Patient Re-evaluated prior to inductionOxygen Delivery Method: Circle system utilized Preoxygenation: Pre-oxygenation with 100% oxygen Intubation Type: IV induction and Cricoid Pressure applied Ventilation: Two handed mask ventilation required Laryngoscope Size: Glidescope, Mac and 4 Grade View: Grade I Tube type: Oral Tube size: 8.0 mm Number of attempts: 1 Airway Equipment and Method: Rigid stylet Placement Confirmation: ETT inserted through vocal cords under direct vision,  positive ETCO2 and breath sounds checked- equal and bilateral Secured at: 23 cm Tube secured with: Tape Dental Injury: Teeth and Oropharynx as per pre-operative assessment  Difficulty Due To: Difficulty was anticipated Future Recommendations: Recommend- induction with short-acting agent, and alternative techniques readily available

## 2015-05-30 NOTE — Progress Notes (Signed)
      [address]       [name],Daniel [id number]             [phone]      S/p CABG x 3  Intubated, starting to wake up  BP 153/64 mmHg  Pulse 84  Temp(Src) 98.6 F (37 C) (Oral)  Resp 16  Ht 5\' 8"  (1.727 m)  Wt 177 lb 0.5 oz (80.3 kg)  BMI 26.92 kg/m2  SpO2 98%  CI= 1.8 after 1 bottle of albumin   Intake/Output Summary (Last 24 hours) at 05/30/15 1700 Last data filed at 05/30/15 1600  Gross per 24 hour  Intake 3343.28 ml  Output   1588 ml  Net 1755.28 ml    Minimal CT output  Stable early postop- wean to extubate  Viviann SpareSteven C. Dorris FetchHendrickson, MD [facility] [phone]

## 2015-05-30 NOTE — Procedures (Signed)
Extubation Procedure Note  Patient Details:   Name: Daniel FurnishRobert M Kruczek DOB: 04-Mar-1951 MRN: [id number]   Airway Documentation:  Airway 8 mm (Active)  Secured at (cm) 23 cm 05/30/2015  1:35 PM  Measured From Lips 05/30/2015  1:35 PM  Secured Location Left 05/30/2015  1:35 PM  Secured By Pink Tape 05/30/2015  1:35 PM  Site Condition Dry 05/30/2015  1:35 PM   Extubated to 4lpm Freedom, incentive spirometer 250/500cc's.  Evaluation  O2 sats: stable throughout Complications: No apparent complications Patient did tolerate procedure well. Bilateral Breath Sounds: Clear, Diminished   Yes  Renae FickleScott, Damisha Wolff Michelle 05/30/2015, 6:50 PM

## 2015-05-30 NOTE — OR Nursing (Signed)
SICU RN notified of 20-30 mins before arrival.

## 2015-05-30 NOTE — Anesthesia Preprocedure Evaluation (Addendum)
Anesthesia Evaluation  Patient identified by MRN, date of birth, ID band Patient awake    Reviewed: Allergy & Precautions, H&P , NPO status , Patient's Chart, lab work & pertinent test results  Airway Mallampati: III  TM Distance: >3 FB Neck ROM: Full    Dental no notable dental hx. (+) Teeth Intact, Dental Advisory Given   Pulmonary neg pulmonary ROS, former smoker,    Pulmonary exam normal breath sounds clear to auscultation       Cardiovascular hypertension, Pt. on medications and Pt. on home beta blockers + angina + CAD and + Past MI   Rhythm:Regular Rate:Normal     Neuro/Psych  Neuromuscular disease negative psych ROS   GI/Hepatic negative GI ROS, Neg liver ROS,   Endo/Other  diabetes, Type 1, Insulin Dependent  Renal/GU ESRF and DialysisRenal disease  negative genitourinary   Musculoskeletal   Abdominal   Peds  Hematology negative hematology ROS (+) anemia ,   Anesthesia Other Findings   Reproductive/Obstetrics negative OB ROS                            Anesthesia Physical Anesthesia Plan  ASA: IV  Anesthesia Plan: General   Post-op Pain Management:    Induction: Intravenous  Airway Management Planned: Oral ETT  Additional Equipment: Arterial line, CVP, PA Cath, TEE and Ultrasound Guidance Line Placement  Intra-op Plan:   Post-operative Plan: Post-operative intubation/ventilation  Informed Consent: I have reviewed the patients History and Physical, chart, labs and discussed the procedure including the risks, benefits and alternatives for the proposed anesthesia with the patient or authorized representative who has indicated his/her understanding and acceptance.   Dental advisory given  Plan Discussed with: CRNA  Anesthesia Plan Comments:         Anesthesia Quick Evaluation

## 2015-05-30 NOTE — Transfer of Care (Signed)
Immediate Anesthesia Transfer of Care Note  Patient: Daniel FurnishRobert M Anthony  Procedure(s) Performed: Procedure(s):  CORONARY ARTERY BYPASS GRAFTING (CABG) x 3 (LIMA to LAD, SVG to CIRCUMFLEX, and SVG to PDA) with EVH from right thigh greater saphenous vein and left internal mammary artery harvest (N/A) TRANSESOPHAGEAL ECHOCARDIOGRAM (TEE) (N/A)  Patient Location: SICU  Anesthesia Type:General  Level of Consciousness: sedated and Patient remains intubated per anesthesia plan  Airway & Oxygen Therapy: Patient remains intubated per anesthesia plan and Patient placed on Ventilator (see vital sign flow sheet for setting)  Post-op Assessment: Report given to RN and Post -op Vital signs reviewed and stable  Post vital signs: Reviewed and stable  Last Vitals:  Filed Vitals:   05/30/15 0435  BP: 153/64  Pulse: 84  Temp: 37 C  Resp: 16    Complications: No apparent anesthesia complications

## 2015-05-30 NOTE — Anesthesia Postprocedure Evaluation (Signed)
  Anesthesia Post-op Note  Patient: Daniel FurnishRobert M Hochberg  Procedure(s) Performed: Procedure(s):  CORONARY ARTERY BYPASS GRAFTING (CABG) x 3 (LIMA to LAD, SVG to CIRCUMFLEX, and SVG to PDA) with EVH from right thigh greater saphenous vein and left internal mammary artery harvest (N/A) TRANSESOPHAGEAL ECHOCARDIOGRAM (TEE) (N/A)  Patient Location: ICU  Anesthesia Type:General  Level of Consciousness: sedated and unresponsive  Airway and Oxygen Therapy: Patient remains intubated and on ventilator  Post-op Pain: none  Post-op Assessment: Post-op Vital signs reviewed, Patient's Cardiovascular Status Stable and Respiratory Function Stable  Post-op Vital Signs: Reviewed  Filed Vitals:   05/30/15 0435  BP: 153/64  Pulse: 84  Temp: 37 C  Resp: 16    Complications: No apparent anesthesia complications

## 2015-05-30 NOTE — Progress Notes (Signed)
  Echocardiogram Echocardiogram Transesophageal has been performed.  Arvil ChacoFoster, Latrease Kunde 05/30/2015, 9:31 AM

## 2015-05-30 NOTE — Progress Notes (Signed)
Admit: 05/26/2015 LOS: 4  66M ESRD Home HD K Machine L FA AVF with NSTEMI and  MV Disease, s/p  CABG X 3  earlier today   Subjective:  In ICU s/p CABG- usual drips- hgb 11.6- latest K was 3.6  10/13 0701 - 10/14 0700 In: 240 [P.O.:240] Out: 3400   Filed Weights   05/29/15 0712 05/29/15 1143 05/30/15 0435  Weight: 82.4 kg (181 lb 10.5 oz) 79.5 kg (175 lb 4.3 oz) 80.3 kg (177 lb 0.5 oz)    Scheduled Meds: . [START ON 05/31/2015] acetaminophen  1,000 mg Oral 4 times per day   Or  . [START ON 05/31/2015] acetaminophen (TYLENOL) oral liquid 160 mg/5 mL  1,000 mg Per Tube 4 times per day  . acetaminophen (TYLENOL) oral liquid 160 mg/5 mL  650 mg Per Tube Once   Or  . acetaminophen  650 mg Rectal Once  . [START ON 05/31/2015] aspirin EC  325 mg Oral Daily   Or  . [START ON 05/31/2015] aspirin  324 mg Per Tube Daily  . [START ON 05/31/2015] bisacodyl  10 mg Oral Daily   Or  . [START ON 05/31/2015] bisacodyl  10 mg Rectal Daily  . calcitRIOL  0.25 mcg Oral BID  . calcium acetate  667 mg Oral TID WC  . [START ON 05/31/2015] cefUROXime (ZINACEF)  IV  1.5 g Intravenous Q24H  . darbepoetin (ARANESP) injection - DIALYSIS  200 mcg Intravenous Q Tue-HD  . [START ON 05/31/2015] docusate sodium  200 mg Oral Daily  . escitalopram  20 mg Oral QHS  . famotidine (PEPCID) IV  20 mg Intravenous Q24H  . fentaNYL  100 mcg Transdermal Q72H  . insulin regular  0-10 Units Intravenous TID WC  . metoprolol tartrate  12.5 mg Oral BID   Or  . metoprolol tartrate  12.5 mg Per Tube BID  . multivitamin  1 tablet Oral QHS  . [START ON 06/01/2015] pantoprazole  40 mg Oral Daily  . potassium chloride  10 mEq Intravenous Q1 Hr x 3  . rosuvastatin  40 mg Oral QHS  . [START ON 05/31/2015] sodium chloride  3 mL Intravenous Q12H  . tamsulosin  0.4 mg Oral QHS  . vancomycin  1,000 mg Intravenous Once   Continuous Infusions: . sodium chloride    . [START ON 05/31/2015] sodium chloride    . sodium chloride     . dexmedetomidine    . EPINEPHrine 4 mg in dextrose 5% 250 mL infusion (16 mcg/mL)    . insulin (NOVOLIN-R) infusion    . lactated ringers    . lactated ringers    . milrinone    . nitroGLYCERIN    . norepinephrine (LEVOPHED) Adult infusion    . phenylephrine (NEO-SYNEPHRINE) Adult infusion     PRN Meds:.sodium chloride, albumin human, lactated ringers, metoprolol, midazolam, morphine injection, morphine injection, ondansetron (ZOFRAN) IV, oxyCODONE, [START ON 05/31/2015] sodium chloride, traMADol  Current Labs: reviewed    Physical Exam:  Blood pressure 153/64, pulse 84, temperature 98.6 F (37 C), temperature source Oral, resp. rate 16, height 5\' 8"  (1.727 m), weight 80.3 kg (177 lb 0.5 oz), SpO2 98 %. GEN: NAD ENT: NCAT EYES: EOMI CV: RRR no rub PULM: CTAB ABD: s/nt/nd SKIN: no rashes/lesions EXT: positive for edema LFA AVF -- Faint B/T  Unit: Home HD -- K Machine Days: MWF Time: 4h Dialyzer: F180 EDW: 79kg K/Ca: 2/2.25 Access: AVF LUE BFR/DFR: 400/A2 UF Proflie: none VDRA: PO Calcitriol 0.8025mcg  daily EPO: Mircera Most Recent Phos / PTH: 6.2 / 549 Most Recent TSAT / Ferritin: 53/935 Treatment Adherence: Good  A/P 1. ESRD:  1. MWF home hemo via L FA AVF 2. Done Tuesday and Thursday this week so far.  Will evaluate tomorrow for need- I usually try to leave them alone until 2 days post op if I can 3. No heparin in system, on systemic heparin 4. Wife cannulates AVF 5. Keep EDW here 2. ACS NSTEMI 1. S/p CABG 3. HTN/Vol: as above off BP Meds- on pressors at present 4. Anemia:  1. On ESA as outpt, given 200 Aranesp 10/11, scheduled weekly 2. No IV Fe 3. Transfuse as needed  5. MBD: 1. Cont outpt meds of PhosLo and Calcitriol once taking PO's again   Daniel Anthony A   05/30/2015, 2:35 PM   Recent Labs Lab 05/27/15 0222 05/29/15 0759 05/30/15 0440  05/30/15 1025 05/30/15 1118 05/30/15 1218 05/30/15 1336  NA 130* 144 136  < > 136 133* 132*  134*  K 4.4 3.1* 4.1  < > 3.2* 3.4* 4.0 3.6  CL 90* 101 92*  < > 95* 95* 99*  --   CO2 --   --   --   --   --   GLUCOSE 70 79 240*  < > 113* 90 151* 167*  BUN 59* 19 25*  < > 23* 23* 23*  --   CREATININE 8.57* 3.45* 4.75*  < > 3.90* 4.10* 3.90*  --   CALCIUM 7.7* 9.0 9.2  --   --   --   --   --   PHOS  --  3.0  --   --   --   --   --   --   < > = values in this interval not displayed.  Recent Labs Lab 05/27/15 0222  05/29/15 0759 05/30/15 0440  05/30/15 1116  05/30/15 1218 05/30/15 1336 05/30/15 1348  WBC 10.3  --  8.0 7.6  --   --   --   --   --  10.4  NEUTROABS 8.8*  --   --   --   --   --   --   --   --   --   HGB 7.6*  < > 8.6* 9.5*  < > 7.6*  < > 10.9* 11.6* 11.6*  HCT 22.8*  < > 26.4* 29.1*  < > 22.6*  < > 32.0* 34.0* 34.1*  MCV 91.6  --  92.6 94.5  --   --   --   --   --  89.5  PLT 203  --  243 285  --  133*  --   --   --  128*  < > = values in this interval not displayed.

## 2015-05-30 NOTE — OR Nursing (Signed)
SICU notified of off pump call, 1hr 

## 2015-05-30 NOTE — OR Nursing (Signed)
45 min call to SICU

## 2015-05-30 NOTE — Brief Op Note (Signed)
05/26/2015 - 05/30/2015  11:43 AM  PATIENT:  Daniel Anthony  64 y.o. male  PRE-OPERATIVE DIAGNOSIS:  1. S/p NSTEMI 2.Multivessel CAD  POST-OPERATIVE DIAGNOSIS:  1. S/p NSTEMI 2.Multivessel CAD   PROCEDURE:  TRANSESOPHAGEAL ECHOCARDIOGRAM (TEE), MEDIAN STERNOTOMY for CORONARY ARTERY BYPASS GRAFTING (CABG) x 3 (LIMA to LAD, SVG to CIRCUMFLEX, and SVG to PDA) with EVH from right thigh greater saphenous vein and left internal mammary artery harvest  SURGEON:  Surgeon(s) and Role:    * Kerin PernaPeter Van Trigt, MD - Primary  PHYSICIAN ASSISTANT: Doree Fudgeonielle Zimmerman PA-C  ANESTHESIA:   general  EBL:  Total I/O In: 1000 [I.V.:1000] Out: 0   BLOOD ADMINISTERED:Two FFP  DRAINS: Chest tubes placed in the mediastinal and pleural spaces   COUNTS CORRECT:  YES  DICTATION: .Dragon Dictation  PLAN OF CARE: Admit to inpatient   PATIENT DISPOSITION:  ICU - intubated and hemodynamically stable.   Delay start of Pharmacological VTE agent (>24hrs) due to surgical blood loss or risk of bleeding: yes  BASELINE WEIGHT: 80 kg

## 2015-05-31 ENCOUNTER — Inpatient Hospital Stay (HOSPITAL_COMMUNITY): Payer: Medicare Other

## 2015-05-31 LAB — POCT I-STAT, CHEM 8
BUN: 29 mg/dL — AB (ref 6–20)
Calcium, Ion: 1.08 mmol/L — ABNORMAL LOW (ref 1.13–1.30)
Chloride: 97 mmol/L — ABNORMAL LOW (ref 101–111)
Creatinine, Ser: 4.7 mg/dL — ABNORMAL HIGH (ref 0.61–1.24)
Glucose, Bld: 137 mg/dL — ABNORMAL HIGH (ref 65–99)
HEMATOCRIT: 32 % — AB (ref 39.0–52.0)
Hemoglobin: 10.9 g/dL — ABNORMAL LOW (ref 13.0–17.0)
Potassium: 4.1 mmol/L (ref 3.5–5.1)
SODIUM: 126 mmol/L — AB (ref 135–145)
TCO2: 20 mmol/L (ref 0–100)

## 2015-05-31 LAB — GLUCOSE, CAPILLARY
GLUCOSE-CAPILLARY: 101 mg/dL — AB (ref 65–99)
GLUCOSE-CAPILLARY: 107 mg/dL — AB (ref 65–99)
GLUCOSE-CAPILLARY: 114 mg/dL — AB (ref 65–99)
GLUCOSE-CAPILLARY: 117 mg/dL — AB (ref 65–99)
GLUCOSE-CAPILLARY: 126 mg/dL — AB (ref 65–99)
GLUCOSE-CAPILLARY: 131 mg/dL — AB (ref 65–99)
GLUCOSE-CAPILLARY: 132 mg/dL — AB (ref 65–99)
GLUCOSE-CAPILLARY: 133 mg/dL — AB (ref 65–99)
GLUCOSE-CAPILLARY: 146 mg/dL — AB (ref 65–99)
GLUCOSE-CAPILLARY: 80 mg/dL (ref 65–99)
Glucose-Capillary: 109 mg/dL — ABNORMAL HIGH (ref 65–99)
Glucose-Capillary: 117 mg/dL — ABNORMAL HIGH (ref 65–99)
Glucose-Capillary: 124 mg/dL — ABNORMAL HIGH (ref 65–99)
Glucose-Capillary: 131 mg/dL — ABNORMAL HIGH (ref 65–99)
Glucose-Capillary: 132 mg/dL — ABNORMAL HIGH (ref 65–99)
Glucose-Capillary: 139 mg/dL — ABNORMAL HIGH (ref 65–99)
Glucose-Capillary: 139 mg/dL — ABNORMAL HIGH (ref 65–99)
Glucose-Capillary: 139 mg/dL — ABNORMAL HIGH (ref 65–99)
Glucose-Capillary: 153 mg/dL — ABNORMAL HIGH (ref 65–99)
Glucose-Capillary: 163 mg/dL — ABNORMAL HIGH (ref 65–99)
Glucose-Capillary: 175 mg/dL — ABNORMAL HIGH (ref 65–99)
Glucose-Capillary: 72 mg/dL (ref 65–99)
Glucose-Capillary: 86 mg/dL (ref 65–99)
Glucose-Capillary: 86 mg/dL (ref 65–99)
Glucose-Capillary: 94 mg/dL (ref 65–99)

## 2015-05-31 LAB — BASIC METABOLIC PANEL
Anion gap: 13 (ref 5–15)
BUN: 27 mg/dL — ABNORMAL HIGH (ref 6–20)
CALCIUM: 8 mg/dL — AB (ref 8.9–10.3)
CO2: 21 mmol/L — ABNORMAL LOW (ref 22–32)
Chloride: 97 mmol/L — ABNORMAL LOW (ref 101–111)
Creatinine, Ser: 4.85 mg/dL — ABNORMAL HIGH (ref 0.61–1.24)
GFR, EST AFRICAN AMERICAN: 13 mL/min — AB (ref >60)
GFR, EST NON AFRICAN AMERICAN: 11 mL/min — AB (ref >60)
Glucose, Bld: 141 mg/dL — ABNORMAL HIGH (ref 65–99)
Potassium: 4 mmol/L (ref 3.5–5.1)
SODIUM: 131 mmol/L — AB (ref 135–145)

## 2015-05-31 LAB — CREATININE, SERUM
CREATININE: 5.18 mg/dL — AB (ref 0.61–1.24)
Creatinine, Ser: 5.18 mg/dL — ABNORMAL HIGH (ref 0.61–1.24)
GFR calc Af Amer: 12 mL/min — ABNORMAL LOW (ref >60)
GFR calc non Af Amer: 11 mL/min — ABNORMAL LOW (ref >60)
GFR, EST AFRICAN AMERICAN: 12 mL/min — AB (ref >60)
GFR, EST NON AFRICAN AMERICAN: 11 mL/min — AB (ref >60)

## 2015-05-31 LAB — CBC
HCT: 30.8 % — ABNORMAL LOW (ref 39.0–52.0)
HCT: 31.2 % — ABNORMAL LOW (ref 39.0–52.0)
HEMATOCRIT: 31.4 % — AB (ref 39.0–52.0)
HEMOGLOBIN: 10.4 g/dL — AB (ref 13.0–17.0)
Hemoglobin: 10.4 g/dL — ABNORMAL LOW (ref 13.0–17.0)
Hemoglobin: 10.1 g/dL — ABNORMAL LOW (ref 13.0–17.0)
MCH: 30.7 pg (ref 26.0–34.0)
MCH: 30.1 pg (ref 26.0–34.0)
MCH: 29.6 pg (ref 26.0–34.0)
MCHC: 33.8 g/dL (ref 30.0–36.0)
MCHC: 33.1 g/dL (ref 30.0–36.0)
MCHC: 32.4 g/dL (ref 30.0–36.0)
MCV: 90.9 fL (ref 78.0–100.0)
MCV: 91 fL (ref 78.0–100.0)
MCV: 91.5 fL (ref 78.0–100.0)
PLATELETS: 140 10*3/uL — AB (ref 150–400)
PLATELETS: 139 10*3/uL — AB (ref 150–400)
Platelets: 128 10*3/uL — ABNORMAL LOW (ref 150–400)
RBC: 3.39 MIL/uL — ABNORMAL LOW (ref 4.22–5.81)
RBC: 3.45 MIL/uL — ABNORMAL LOW (ref 4.22–5.81)
RBC: 3.41 MIL/uL — ABNORMAL LOW (ref 4.22–5.81)
RDW: 16.9 % — AB (ref 11.5–15.5)
RDW: 17 % — ABNORMAL HIGH (ref 11.5–15.5)
RDW: 16.9 % — AB (ref 11.5–15.5)
WBC: 11.2 10*3/uL — AB (ref 4.0–10.5)
WBC: 11.8 10*3/uL — ABNORMAL HIGH (ref 4.0–10.5)
WBC: 12.1 10*3/uL — AB (ref 4.0–10.5)

## 2015-05-31 LAB — PREPARE FRESH FROZEN PLASMA
UNIT DIVISION: 0
Unit division: 0
Unit division: 0

## 2015-05-31 LAB — MAGNESIUM
MAGNESIUM: 2.2 mg/dL (ref 1.7–2.4)
MAGNESIUM: 2.2 mg/dL (ref 1.7–2.4)

## 2015-05-31 LAB — HEMOGLOBIN A1C
HEMOGLOBIN A1C: 9.3 % — AB (ref 4.8–5.6)
MEAN PLASMA GLUCOSE: 220 mg/dL

## 2015-05-31 MED ORDER — INSULIN ASPART 100 UNIT/ML ~~LOC~~ SOLN
0.0000 [IU] | SUBCUTANEOUS | Status: DC
  Administered 2015-05-31: 2 [IU] via SUBCUTANEOUS
  Administered 2015-05-31: 4 [IU] via SUBCUTANEOUS

## 2015-05-31 MED ORDER — FENTANYL 75 MCG/HR TD PT72: 100.0000 ug | MEDICATED_PATCH | TRANSDERMAL | Status: DC

## 2015-05-31 MED ORDER — INSULIN DETEMIR 100 UNIT/ML ~~LOC~~ SOLN
12.0000 [IU] | Freq: Once | SUBCUTANEOUS | Status: AC
  Administered 2015-05-31: 12 [IU] via SUBCUTANEOUS
  Filled 2015-05-31: qty 0.12

## 2015-05-31 MED ORDER — FENTANYL CITRATE (PF) 100 MCG/2ML IJ SOLN
50.0000 ug | INTRAMUSCULAR | Status: DC | PRN
  Administered 2015-05-31 – 2015-06-02 (×9): 50 ug via INTRAVENOUS
  Filled 2015-05-31 (×9): qty 2

## 2015-05-31 MED ORDER — CARVEDILOL 3.125 MG PO TABS
3.1250 mg | ORAL_TABLET | Freq: Two times a day (BID) | ORAL | Status: DC
  Administered 2015-06-01 – 2015-06-05 (×5): 3.125 mg via ORAL
  Filled 2015-05-31 (×11): qty 1

## 2015-05-31 MED ORDER — TAMSULOSIN HCL 0.4 MG PO CAPS: 0.4000 mg | ORAL_CAPSULE | Freq: Every day | ORAL | Status: DC

## 2015-05-31 MED ORDER — ESCITALOPRAM OXALATE 20 MG PO TABS: 20.0000 mg | ORAL_TABLET | Freq: Every day | ORAL | Status: DC

## 2015-05-31 MED ORDER — ENOXAPARIN SODIUM 30 MG/0.3ML ~~LOC~~ SOLN
30.0000 mg | Freq: Every day | SUBCUTANEOUS | Status: DC
  Administered 2015-05-31: 30 mg via SUBCUTANEOUS
  Filled 2015-05-31 (×3): qty 0.3

## 2015-05-31 MED ORDER — INSULIN DETEMIR 100 UNIT/ML ~~LOC~~ SOLN
12.0000 [IU] | Freq: Every day | SUBCUTANEOUS | Status: DC
  Filled 2015-05-31: qty 0.12

## 2015-05-31 MED ORDER — HYDROMORPHONE HCL 2 MG PO TABS
2.0000 mg | ORAL_TABLET | ORAL | Status: DC | PRN
  Administered 2015-05-31: 2 mg via ORAL
  Administered 2015-05-31: 4 mg via ORAL
  Administered 2015-06-02 (×3): 2 mg via ORAL
  Administered 2015-06-04: 4 mg via ORAL
  Administered 2015-06-05 – 2015-06-09 (×6): 2 mg via ORAL
  Filled 2015-05-31: qty 1
  Filled 2015-05-31: qty 2
  Filled 2015-05-31 (×6): qty 1
  Filled 2015-05-31: qty 2
  Filled 2015-05-31 (×5): qty 1

## 2015-05-31 MED ORDER — POLYETHYLENE GLYCOL 3350 17 G PO PACK
8.5000 g | PACK | Freq: Every day | ORAL | Status: DC
  Filled 2015-05-31 (×5): qty 1

## 2015-05-31 MED ORDER — LORAZEPAM 0.5 MG PO TABS
0.5000 mg | ORAL_TABLET | Freq: Three times a day (TID) | ORAL | Status: DC | PRN
  Administered 2015-05-31 – 2015-06-09 (×20): 0.5 mg via ORAL
  Filled 2015-05-31 (×18): qty 1

## 2015-05-31 MED ORDER — CINACALCET HCL 30 MG PO TABS
30.0000 mg | ORAL_TABLET | Freq: Every day | ORAL | Status: DC
  Administered 2015-05-31 – 2015-06-08 (×8): 30 mg via ORAL
  Filled 2015-05-31 (×9): qty 1

## 2015-05-31 NOTE — Op Note (Signed)
NAME:  Daniel Anthony, Daniel Anthony                ACCOUNT NO.:  [id number]  MEDICAL RECORD NO.:  [id number]  LOCATION:  2S12C                        FACILITY:  [facility]  PHYSICIAN:  Kerin PernaPeter Van Trigt, M.D.  DATE OF BIRTH:  Apr 23, 1951  DATE OF PROCEDURE: DATE OF DISCHARGE:                              OPERATIVE REPORT   OPERATIONS: 1. Coronary artery bypass grafting x3 (left internal mammary artery to     left anterior descending, saphenous vein graft to circumflex     marginal, saphenous vein graft to posterior descending). 2. Endoscopic harvest of right leg greater saphenous vein.  PREOPERATIVE DIAGNOSES: 1. Severe 3-vessel coronary artery disease. 2. Non-ST elevation myocardial infarction. 3. Ischemic cardiomyopathy with ejection fraction of 30%.  POSTOPERATIVE DIAGNOSES: 1. Severe 3-vessel coronary artery disease. 2. Non-ST elevation myocardial infarction. 3. Ischemic cardiomyopathy with ejection fraction of 30%.  SURGEON:  Kerin PernaPeter Van Trigt, M.D.  ASSISTANT:  Doree Fudgeonielle Zimmerman, P.A.  ANESTHESIA:  General by Zenon MayoW. Edmond Fitzgerald, M.D.  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  He presented with symptoms and clinical evidence of a non-ST-elevation MI.  Cardiac catheterization demonstrated high-grade stenosis of previously placed stents with reduced LV function.  Surgical coronary revascularization was recommended by his cardiologist.  I reviewed the patient's echo, coronary angiogram, and his medical records and felt that the patient would be a candidate, but at increased risk due to his comorbid medical problems.  The patient was prepared for surgery.  I discussed the details of his CABG for treatment of his CAD in detail with the patient and family.  I discussed the major aspects of the operation including the use of  general anesthesia and cardiopulmonary bypass, the expected postoperative hospital recovery, location of the surgical incisions, and the potential risks of stroke, MI, bleeding requiring transfusion, infection, postoperative pulmonary problems including pleural effusions, and death.  After reviewing these issues, he understood.  He demonstrated his understanding and agreed to proceed with surgery under what I felt was an informed consent.  OPERATIVE FINDINGS: 1. Small, but adequate conduit. 2. Heavily diseased target vessels, but graftable. 3. Improved LV global function after surgical coronary     revascularization. 4. Intraoperative anemia, requiring transfusion with packed blood cell     units.  DESCRIPTION OF PROCEDURE:  The patient was brought to the operating room, placed supine on the operating table.  General anesthesia was induced under invasive hemodynamic monitoring.  The chest, abdomen, and legs were prepped with Betadine and draped as a sterile field.  A proper time-out was performed.  A sternal incision was made.  The leg incision was made for endoscopic vein harvest.  The sternum was elevated with a retractor and the left internal mammary artery was harvested as a pedicle graft from its origin at the subclavian vessels.  It was 1.5-mm vessel with excellent flow.  The sternal retractor was placed and the pericardium was opened and suspended.  Pursestrings were placed in the ascending aorta and right atrium and the patient was heparinized.  When the ACT was documented as being therapeutic, the patient was  cannulated and placed on cardiopulmonary bypass.  The coronaries were identified for grafting.  The mammary artery and vein grafts were prepared for the distal anastomoses.  Cardioplegia cannulas were placed both antegrade and retrograde cold blood cardioplegia.  The patient was cooled to 32 degrees.  The aortic crossclamp was applied.  One liter of cold  blood cardioplegia was delivered in split doses between the antegrade aortic and retrograde coronary sinus catheters.  There was good cardioplegic arrest and septal temperature dropped less than 14 degrees. Cardioplegia was delivered every 20 minutes or less.  The distal coronary anastomoses were performed.  The first distal anastomosis was the posterior descending branch right coronary.  There was a proximal 99% stenosis.  A reverse saphenous vein was sewn end-to- side with running 7-0 Prolene with good flow through the graft. Cardioplegia was redosed.  The second distal anastomosis was the OM branch of the left circumflex. This had a proximal 90% stenosis.  A reverse saphenous vein was sewn end- to-side with running 7-0 Prolene with good flow through the graft. Cardioplegia was redosed.  The third distal anastomosis was to the distal LAD.  The proximal LAD had a proximal 90% stenosis.  The left IMA pedicle was brought through an opening and the left lateral pericardium was brought down onto the LAD and sewn end-to-side with running 8-0 Prolene.  There was good flow through the anastomosis after briefly releasing the pedicle bulldog on the mammary artery and the bulldog was reapplied.  The anastomosis was hemostatic.  The pedicle was secured.  Epicardium with 6-0 Prolene. Cardioplegia was redosed.  Although the crossclamp was still in place, 2 proximal vein anastomoses were performed on the ascending aorta using a 4.5 mm punch and running 6- 0 Prolene.  Prior to removing the crossclamp, air was removed from the coronaries with a dose of retrograde warm blood cardioplegia and air was removed from the heart using the usual de-airing maneuvers.  The heart was cardioverted back to a regular rhythm.  The vein grafts were opened and each had good flow with hemostasis documented at the proximal and distal anastomoses.  The patient was rewarmed and reperfused.  Temporary pacing wires were  applied.  The lungs re-expanded and the ventilator was resumed.  The patient was weaned off cardiopulmonary bypass on low-dose milrinone and epinephrine.  Cardiac output was normal.  Echo showed significant improvement in global LV function.  There was no significant MR.  Protamine was administered without adverse reaction.  The cannulas were removed.  The mediastinum was irrigated.  The superior pericardial fat was closed over the aorta.  The anterior mediastinal and left pleural chest tubes were placed and brought out through separate incisions.  The sternum was closed with wire.  The pectoralis fascia was closed in running #1 Vicryl.  The subcutaneous and skin layers were closed in running Vicryl and sterile dressings were applied. Total cardiopulmonary bypass time was 130 minutes.  The patient returned to the ICU in critical, but stable condition.     Kerin Perna, M.D.     PV/MEDQ  D:  05/30/2015  T:  05/31/2015  Job:  [id number]  cc:   Rollene Rotunda, MD, [facility]

## 2015-05-31 NOTE — Progress Notes (Signed)
1 Day Post-Op Procedure(s) (LRB):  CORONARY ARTERY BYPASS GRAFTING (CABG) x 3 (LIMA to LAD, SVG to CIRCUMFLEX, and SVG to PDA) with EVH from right thigh greater saphenous vein and left internal mammary artery harvest (N/A) TRANSESOPHAGEAL ECHOCARDIOGRAM (TEE) (N/A) Subjective: C/o back and neck pain  Objective: Vital signs in last 24 hours: Temp:  [96.4 F (35.8 C)-98.1 F (36.7 C)] 96.8 F (36 C) (10/15 0700) Pulse Rate:  [84-91] 88 (10/15 0700) Cardiac Rhythm:  [-] Atrial paced (10/15 0400) Resp:  [12-24] 12 (10/15 0700) BP: (90-111)/(51-67) 106/57 mmHg (10/15 0700) SpO2:  [93 %-100 %] 98 % (10/15 0700) Arterial Line BP: (92-279)/(41-266) 107/41 mmHg (10/15 0700) FiO2 (%):  [40 %-50 %] 40 % (10/14 1742) Weight:  [189 lb 6 oz (85.9 kg)] 189 lb 6 oz (85.9 kg) (10/15 0245)  Hemodynamic parameters for last 24 hours: PAP: (24-33)/(8-20) 29/19 mmHg CO:  [3.3 L/min-5.6 L/min] 5.6 L/min CI:  [1.7 L/min/m2-2.9 L/min/m2] 2.9 L/min/m2  Intake/Output from previous day: 10/14 0701 - 10/15 0700 In: 6266.5 [P.O.:1800; I.V.:2989.5; Blood:1177; IV Piggyback:300] Out: 1730 [Urine:50; Blood:1000; Chest Tube:180] Intake/Output this shift: Total I/O In: 104.4 [I.V.:104.4] Out: 30 [Chest Tube:30]  General appearance: alert and no distress Neurologic: alert adn oriented Heart: regular rate and rhythm and + rub Lungs: diminished breath sounds bibasilar Abdomen: normal findings: soft, non-tender  Lab Results:  Recent Labs  05/30/15 2114 05/31/15 0400  WBC 9.9 11.2*  HGB 10.8* 10.4*  HCT 31.2* 30.8*  PLT 158 140*   BMET:  Recent Labs  05/30/15 0440  05/30/15 1947 05/30/15 2114 05/31/15 0400  NA 136  < > 135  --  131*  K 4.1  < > 3.8  --  4.0  CL 92*  < > 100*  --  97*  CO2 29  --   --   --  21*  GLUCOSE 240*  < > 95  --  141*  BUN 25*  < > 26*  --  27*  CREATININE 4.75*  < > 4.10* 4.51* 4.85*  CALCIUM 9.2  --   --   --  8.0*  < > = values in this interval not displayed.   PT/INR:  Recent Labs  05/30/15 1348  LABPROT 16.3*  INR 1.30   ABG    Component Value Date/Time   PHART 7.330* 05/30/2015 1947   HCO3 24.1* 05/30/2015 1947   TCO2 26 05/30/2015 1947   TCO2 22 05/30/2015 1947   ACIDBASEDEF 2.0 05/30/2015 1947   O2SAT 98.0 05/30/2015 1947   CBG (last 3)   Recent Labs  05/30/15 2153 05/30/15 2257 05/31/15 0005  GLUCAP 117* 107* 117*    Assessment/Plan: S/P Procedure(s) (LRB):  CORONARY ARTERY BYPASS GRAFTING (CABG) x 3 (LIMA to LAD, SVG to CIRCUMFLEX, and SVG to PDA) with EVH from right thigh greater saphenous vein and left internal mammary artery harvest (N/A) TRANSESOPHAGEAL ECHOCARDIOGRAM (TEE) (N/A) -  CV- stable hemodynamics- wean milrinone, dc swan  Dc a line  RESP- IS  RENAL- ESRD- HD, plan is to not dialyze today  ENDO- DM- CBG well controlled, transition to levemir + SSI  CIMP- PT consult, mobilize as tolerated  DC CT  SCD + enoxaparin for DVT prophlaxis   LOS: 5 days    Loreli SlotSteven C Nessie Nong 05/31/2015

## 2015-05-31 NOTE — Progress Notes (Signed)
Admit: 05/26/2015 LOS: 5  47M ESRD Home HD K Machine L FA AVF with NSTEMI and  MV Disease, s/p  CABG X 3  10/14  Subjective:  In ICU- POD #1 CABG- extubated but still on milrinone and phenylephrine drips  10/14 0701 - 10/15 0700 In: 6266.5 [P.O.:1800; I.V.:2989.5; Blood:1177; IV Piggyback:300] Out: 1730 [Urine:50; Blood:1000; Chest Tube:180]  Filed Weights   05/29/15 1143 05/30/15 0435 05/31/15 0245  Weight: 79.5 kg (175 lb 4.3 oz) 80.3 kg (177 lb 0.5 oz) 85.9 kg (189 lb 6 oz)    Scheduled Meds: . acetaminophen  1,000 mg Oral 4 times per day   Or  . acetaminophen (TYLENOL) oral liquid 160 mg/5 mL  1,000 mg Per Tube 4 times per day  . aspirin EC  325 mg Oral Daily   Or  . aspirin  324 mg Per Tube Daily  . bisacodyl  10 mg Oral Daily   Or  . bisacodyl  10 mg Rectal Daily  . calcitRIOL  0.25 mcg Oral BID  . calcium acetate  667 mg Oral TID WC  . cefUROXime (ZINACEF)  IV  1.5 g Intravenous Q24H  . darbepoetin (ARANESP) injection - DIALYSIS  200 mcg Intravenous Q Tue-HD  . docusate sodium  200 mg Oral Daily  . escitalopram  20 mg Oral QHS  . fentaNYL  100 mcg Transdermal Q72H  . Influenza vac split quadrivalent PF  0.5 mL Intramuscular Tomorrow-1000  . insulin regular  0-10 Units Intravenous TID WC  . metoprolol tartrate  12.5 mg Oral BID   Or  . metoprolol tartrate  12.5 mg Per Tube BID  . multivitamin  1 tablet Oral QHS  . [START ON 06/01/2015] pantoprazole  40 mg Oral Daily  . rosuvastatin  40 mg Oral QHS  . sodium chloride  3 mL Intravenous Q12H  . tamsulosin  0.4 mg Oral QHS   Continuous Infusions: . sodium chloride 20 mL/hr at 05/31/15 0700  . sodium chloride    . sodium chloride 10 mL/hr at 05/31/15 0700  . dexmedetomidine Stopped (05/30/15 1730)  . EPINEPHrine 4 mg in dextrose 5% 250 mL infusion (16 mcg/mL) Stopped (05/30/15 1345)  . insulin (NOVOLIN-R) infusion Stopped (05/30/15 2050)  . lactated ringers    . lactated ringers    . milrinone 0.3 mcg/kg/min  (05/31/15 0700)  . nitroGLYCERIN Stopped (05/30/15 2015)  . norepinephrine (LEVOPHED) Adult infusion Stopped (05/30/15 1345)  . phenylephrine (NEO-SYNEPHRINE) Adult infusion 20 mcg/min (05/31/15 0700)   PRN Meds:.sodium chloride, albumin human, lactated ringers, metoprolol, morphine injection, ondansetron (ZOFRAN) IV, oxyCODONE, sodium chloride, traMADol  Current Labs: reviewed    Physical Exam:  Blood pressure 106/57, pulse 88, temperature 96.8 F (36 C), temperature source Core (Comment), resp. rate 12, height 5\' 8"  (1.727 m), weight 85.9 kg (189 lb 6 oz), SpO2 98 %. GEN: NAD- alert ENT: NCAT EYES: EOMI CV: RRR no rub PULM: CTAB ABD: s/nt/nd SKIN: no rashes/lesions EXT: positive for edema LFA AVF -- Faint B/T  Unit: Home HD -- K Machine Days: MWF Time: 4h Dialyzer: F180 EDW: 79kg K/Ca: 2/2.25 Access: AVF LUE BFR/DFR: 400/A2 UF Proflie: none VDRA: PO Calcitriol 0.4325mcg daily EPO: Mircera Most Recent Phos / PTH: 6.2 / 549 Most Recent TSAT / Ferritin: 53/935 Treatment Adherence: Good  A/P 1. ESRD:  1. MWF home hemo via L FA AVF 2. Done Tuesday and Thursday this week so far.  No acute indications for dialysis today and still on pressors- will re evaluate and  likely do HD tomorrow 3. No heparin  4. Wife cannulates AVF at home 5. Keep EDW here 2. ACS NSTEMI 1. S/p CABG- POD #1- seems to be going as well as to be expected 3. HTN/Vol: as above off BP Meds- milrinone and neo- also flomax which can affect BP.  Sodium getting lower indicates volume on board  4. Anemia:  1. On ESA as outpt, given 200 Aranesp 10/11, scheduled weekly 2. No IV Fe 3. Transfuse as needed 4. Holding steady in the 10's  5. MBD: 1. Cont outpt meds of PhosLo and Calcitriol   Daniel Anthony A   05/31/2015, 7:46 AM   Recent Labs Lab 05/29/15 0759 05/30/15 0440  05/30/15 1218 05/30/15 1336 05/30/15 1947 05/30/15 2114 05/31/15 0400  NA 144 136  < > 132* 134* 135  --  131*  K  3.1* 4.1  < > 4.0 3.6 3.8  --  4.0  CL 101 92*  < > 99*  --  100*  --  97*  CO2 27 29  --   --   --   --   --  21*  GLUCOSE 79 240*  < > 151* 167* 95  --  141*  BUN 19 25*  < > 23*  --  26*  --  27*  CREATININE 3.45* 4.75*  < > 3.90*  --  4.10* 4.51* 4.85*  CALCIUM 9.0 9.2  --   --   --   --   --  8.0*  PHOS 3.0  --   --   --   --   --   --   --   < > = values in this interval not displayed.  Recent Labs Lab 05/27/15 0222  05/30/15 1348 05/30/15 1947 05/30/15 2114 05/31/15 0400  WBC 10.3  < > 10.4  --  9.9 11.2*  NEUTROABS 8.8*  --   --   --   --   --   HGB 7.6*  < > 11.6* 10.9* 10.8* 10.4*  HCT 22.8*  < > 34.1* 32.0* 31.2* 30.8*  MCV 91.6  < > 89.5  --  90.4 90.9  PLT 203  < > 128*  --  158 140*  < > = values in this interval not displayed.

## 2015-05-31 NOTE — Progress Notes (Signed)
      [address]       [place] [id number]             [phone]       Stable day  BP 93/60 mmHg  Pulse 91  Temp(Src) 97.5 F (36.4 C) (Oral)  Resp 18  Ht 5\' 8"  (1.727 m)  Wt 189 lb 6 oz (85.9 kg)  BMI 28.80 kg/m2  SpO2 95%   Intake/Output Summary (Last 24 hours) at 05/31/15 1947 Last data filed at 05/31/15 1800  Gross per 24 hour  Intake 3863.25 ml  Output    181 ml  Net 3682.25 ml   K= 4.1 Hct=31  For HD tomorrow  Viviann SpareSteven C. Dorris FetchHendrickson, MD [facility] [phone]

## 2015-06-01 ENCOUNTER — Inpatient Hospital Stay (HOSPITAL_COMMUNITY): Payer: Medicare Other

## 2015-06-01 LAB — BASIC METABOLIC PANEL
ANION GAP: 16 — AB (ref 5–15)
BUN: 35 mg/dL — AB (ref 6–20)
CO2: 21 mmol/L — ABNORMAL LOW (ref 22–32)
Calcium: 8.6 mg/dL — ABNORMAL LOW (ref 8.9–10.3)
Chloride: 90 mmol/L — ABNORMAL LOW (ref 101–111)
Creatinine, Ser: 5.58 mg/dL — ABNORMAL HIGH (ref 0.61–1.24)
GFR calc Af Amer: 11 mL/min — ABNORMAL LOW (ref >60)
GFR, EST NON AFRICAN AMERICAN: 10 mL/min — AB (ref >60)
Glucose, Bld: 78 mg/dL (ref 65–99)
POTASSIUM: 4.5 mmol/L (ref 3.5–5.1)
Sodium: 127 mmol/L — ABNORMAL LOW (ref 135–145)

## 2015-06-01 LAB — GLUCOSE, CAPILLARY
GLUCOSE-CAPILLARY: 68 mg/dL (ref 65–99)
GLUCOSE-CAPILLARY: 75 mg/dL (ref 65–99)
GLUCOSE-CAPILLARY: 65 mg/dL (ref 65–99)
Glucose-Capillary: 63 mg/dL — ABNORMAL LOW (ref 65–99)
Glucose-Capillary: 111 mg/dL — ABNORMAL HIGH (ref 65–99)

## 2015-06-01 LAB — CBC
HEMATOCRIT: 32.2 % — AB (ref 39.0–52.0)
Hemoglobin: 10.6 g/dL — ABNORMAL LOW (ref 13.0–17.0)
MCH: 30 pg (ref 26.0–34.0)
MCHC: 32.9 g/dL (ref 30.0–36.0)
MCV: 91.2 fL (ref 78.0–100.0)
PLATELETS: 135 10*3/uL — AB (ref 150–400)
RBC: 3.53 MIL/uL — AB (ref 4.22–5.81)
RDW: 16.7 % — AB (ref 11.5–15.5)
WBC: 11.6 10*3/uL — AB (ref 4.0–10.5)

## 2015-06-01 MED ORDER — INSULIN DETEMIR 100 UNIT/ML ~~LOC~~ SOLN
10.0000 [IU] | Freq: Every day | SUBCUTANEOUS | Status: DC
  Administered 2015-06-01: 10 [IU] via SUBCUTANEOUS
  Filled 2015-06-01 (×2): qty 0.1

## 2015-06-01 MED ORDER — DEXTROSE 50 % IV SOLN
INTRAVENOUS | Status: AC
  Administered 2015-06-01: 25 mL
  Filled 2015-06-01: qty 50

## 2015-06-01 MED ORDER — INSULIN ASPART 100 UNIT/ML ~~LOC~~ SOLN
0.0000 [IU] | Freq: Three times a day (TID) | SUBCUTANEOUS | Status: DC
  Administered 2015-06-02: 1 [IU] via SUBCUTANEOUS
  Administered 2015-06-02: 2 [IU] via SUBCUTANEOUS
  Administered 2015-06-03: 3 [IU] via SUBCUTANEOUS
  Administered 2015-06-03 (×2): 2 [IU] via SUBCUTANEOUS
  Administered 2015-06-04 – 2015-06-05 (×3): 3 [IU] via SUBCUTANEOUS
  Administered 2015-06-06 – 2015-06-08 (×3): 1 [IU] via SUBCUTANEOUS

## 2015-06-01 MED ORDER — PHENOL 1.4 % MT LIQD
1.0000 | OROMUCOSAL | Status: DC | PRN
  Administered 2015-06-01: 1 via OROMUCOSAL
  Filled 2015-06-01: qty 177

## 2015-06-01 MED ORDER — MENTHOL 3 MG MT LOZG
1.0000 | LOZENGE | OROMUCOSAL | Status: DC | PRN
Start: 2015-06-01 — End: 2015-06-09
  Administered 2015-06-01: 3 mg via ORAL
  Filled 2015-06-01 (×2): qty 9

## 2015-06-01 NOTE — Progress Notes (Signed)
Hypoglycemic Event  CBG: 65  Treatment: D50 IV 25 mL  Symptoms: None  Follow-up CBG: Time:2215 CBG Result:110  Possible Reasons for Event: Unknown  Comments/MD notified:NA    Daniel Anthony, Daniel Anthony

## 2015-06-01 NOTE — Progress Notes (Signed)
      [address]       [place] [id number]             [phone]       Asleep currently  BP 109/60 mmHg  Pulse 80  Temp(Src) 98.4 F (36.9 C) (Oral)  Resp 17  Ht 5\' 8"  (1.727 m)  Wt 193 lb 12.6 oz (87.9 kg)  BMI 29.47 kg/m2  SpO2 98%   Intake/Output Summary (Last 24 hours) at 06/01/15 1714 Last data filed at 06/01/15 1630  Gross per 24 hour  Intake    570 ml  Output   2000 ml  Net  -1430 ml    2 liters off with HD today  Doing well  Viviann SpareSteven C. Dorris FetchHendrickson, MD [facility] [phone]

## 2015-06-01 NOTE — Progress Notes (Signed)
Admit: 05/26/2015 LOS: 6  68M ESRD Home HD K Machine L FA AVF with NSTEMI and  MV Disease, s/p  CABG X 3  10/14  Subjective:  In ICU- POD #2 CABG- extubated and off drips  10/15 0701 - 10/16 0700 In: 1730.4 [P.O.:1200; I.V.:480.4; IV Piggyback:50] Out: 75 [Urine:5; Chest Tube:70]  Filed Weights   05/30/15 0435 05/31/15 0245 06/01/15 0500  Weight: 80.3 kg (177 lb 0.5 oz) 85.9 kg (189 lb 6 oz) 87.1 kg (192 lb 0.3 oz)    Scheduled Meds: . acetaminophen  1,000 mg Oral 4 times per day   Or  . acetaminophen (TYLENOL) oral liquid 160 mg/5 mL  1,000 mg Per Tube 4 times per day  . aspirin EC  325 mg Oral Daily   Or  . aspirin  324 mg Per Tube Daily  . bisacodyl  10 mg Oral Daily   Or  . bisacodyl  10 mg Rectal Daily  . calcitRIOL  0.25 mcg Oral BID  . calcium acetate  667 mg Oral TID WC  . carvedilol  3.125 mg Oral BID WC  . cefUROXime (ZINACEF)  IV  1.5 g Intravenous Q24H  . cinacalcet  30 mg Oral QHS  . darbepoetin (ARANESP) injection - DIALYSIS  200 mcg Intravenous Q Tue-HD  . docusate sodium  200 mg Oral Daily  . enoxaparin (LOVENOX) injection  30 mg Subcutaneous QHS  . escitalopram  20 mg Oral QHS  . fentaNYL  100 mcg Transdermal Q72H  . Influenza vac split quadrivalent PF  0.5 mL Intramuscular Tomorrow-1000  . insulin aspart  0-24 Units Subcutaneous 6 times per day  . insulin detemir  12 Units Subcutaneous Daily  . multivitamin  1 tablet Oral QHS  . pantoprazole  40 mg Oral Daily  . polyethylene glycol  8.5-17 g Oral QHS  . rosuvastatin  40 mg Oral QHS  . sodium chloride  3 mL Intravenous Q12H  . tamsulosin  0.4 mg Oral QHS   Continuous Infusions: . sodium chloride Stopped (05/31/15 1430)  . sodium chloride    . sodium chloride 10 mL/hr at 06/01/15 0400  . dexmedetomidine Stopped (05/30/15 1730)  . EPINEPHrine 4 mg in dextrose 5% 250 mL infusion (16 mcg/mL) Stopped (05/30/15 1345)  . insulin (NOVOLIN-R) infusion Stopped (05/31/15 1400)  . lactated ringers    .  lactated ringers    . milrinone Stopped (05/31/15 1200)  . nitroGLYCERIN Stopped (05/30/15 2015)  . norepinephrine (LEVOPHED) Adult infusion Stopped (05/30/15 1345)  . phenylephrine (NEO-SYNEPHRINE) Adult infusion Stopped (05/31/15 1300)   PRN Meds:.sodium chloride, fentaNYL (SUBLIMAZE) injection, HYDROmorphone, lactated ringers, LORazepam, metoprolol, morphine injection, ondansetron (ZOFRAN) IV, sodium chloride  Current Labs: reviewed    Physical Exam:  Blood pressure 111/60, pulse 89, temperature 98 F (36.7 C), temperature source Axillary, resp. rate 20, height  (1.727 m), weight 87.1 kg (192 lb 0.3 oz), SpO2 95 %. GEN: NAD- alert ENT: NCAT EYES: EOMI CV: RRR no rub PULM: CTAB ABD: s/nt/nd SKIN: no rashes/lesions EXT: positive for edema LFA AVF -- Faint B/T  Unit: Home HD -- K Machine Days: MWF Time: 4h Dialyzer: F180 EDW: 79kg K/Ca: 2/2.25 Access: AVF LUE BFR/DFR: 400/A2 UF Proflie: none VDRA: PO Calcitriol 0.55mcg daily EPO: Mircera Most Recent Phos / PTH: 6.2 / 549 Most Recent TSAT / Ferritin: 53/935 Treatment Adherence: Good  A/P 1. ESRD:  1. MWF home hemo via L FA AVF 2. Done Tuesday and Thursday this week so far.  Unable to  do HD yesterday due to still being on pressors- now with no HD since Thursday and getting heavy - will run today and tomorrow- No heparin  3. Wife cannulates AVF at home and he would like her to do here 4. Keep EDW - is quite a bit over and he tells me he does not tolerate a lot of volume removal- so will do Sun and Mon 2. ACS NSTEMI 1. S/p CABG- POD #2- seems to be going as well as to be expected 3. HTN/Vol: as above off BP Meds-also flomax which can affect BP.  Sodium getting lower indicates volume on board  4. Anemia:  1. On ESA as outpt, given 200 Aranesp 10/11, scheduled weekly 2. No IV Fe 3. Transfuse as needed 4. Holding steady in the 10's  5. MBD: 1. Cont outpt meds of PhosLo,  Calcitriol and  sensipar  Justino Boze A   06/01/2015, 7:44 AM   Recent Labs Lab 05/29/15 0759 05/30/15 0440  05/31/15 0400  05/31/15 1728 05/31/15 1813 06/01/15 0451  NA 144 136  < > 131*  --  126*  --  127*  K 3.1* 4.1  < > 4.0  --  4.1  --  4.5  CL 101 92*  < > 97*  --  97*  --  90*  CO2 27 29  --  21*  --   --   --  21*  GLUCOSE 79 240*  < > 141*  --  137*  --  78  BUN 19 25*  < > 27*  --  29*  --  35*  CREATININE 3.45* 4.75*  < > 4.85*  < > 4.70* 5.18* 5.58*  CALCIUM 9.0 9.2  --  8.0*  --   --   --  8.6*  PHOS 3.0  --   --   --   --   --   --   --   < > = values in this interval not displayed.  Recent Labs Lab 05/27/15 0222  05/31/15 1046 05/31/15 1728 05/31/15 1813 06/01/15 0451  WBC 10.3  < > 11.8*  --  12.1* 11.6*  NEUTROABS 8.8*  --   --   --   --   --   HGB 7.6*  < > 10.4* 10.9* 10.1* 10.6*  HCT 22.8*  < > 31.4* 32.0* 31.2* 32.2*  MCV 91.6  < > 91.0  --  91.5 91.2  PLT 203  < > 128*  --  139* 135*  < > = values in this interval not displayed.

## 2015-06-01 NOTE — Progress Notes (Signed)
2 Days Post-Op Procedure(s) (LRB):  CORONARY ARTERY BYPASS GRAFTING (CABG) x 3 (LIMA to LAD, SVG to CIRCUMFLEX, and SVG to PDA) with EVH from right thigh greater saphenous vein and left internal mammary artery harvest (N/A) TRANSESOPHAGEAL ECHOCARDIOGRAM (TEE) (N/A) Subjective: C/o sore throat, otherwise feels well  Objective: Vital signs in last 24 hours: Temp:  [96.8 F (36 C)-98 F (36.7 C)] 98 F (36.7 C) (10/16 0400) Pulse Rate:  [88-91] 89 (10/16 0700) Cardiac Rhythm:  [-] Atrial paced (10/16 0400) Resp:  [14-22] 20 (10/16 0700) BP: (91-114)/(54-63) 111/60 mmHg (10/16 0700) SpO2:  [90 %-98 %] 95 % (10/16 0700) Arterial Line BP: (117-144)/(42-57) 117/49 mmHg (10/15 1300) Weight:  [192 lb 0.3 oz (87.1 kg)] 192 lb 0.3 oz (87.1 kg) (10/16 0500)  Hemodynamic parameters for last 24 hours: PAP: (23-38)/(13-33) 23/13 mmHg CO:  [6.4 L/min] 6.4 L/min CI:  [3.3 L/min/m2] 3.3 L/min/m2  Intake/Output from previous day: 10/15 0701 - 10/16 0700 In: 1730.4 [P.O.:1200; I.V.:480.4; IV Piggyback:50] Out: 75 [Urine:5; Chest Tube:70] Intake/Output this shift:    General appearance: alert, cooperative and no distress Neurologic: alert and oriented, no new focal deficit Heart: regular rate and rhythm Lungs: diminished breath sounds bibasilar Abdomen: normal findings: soft, non-tender  Lab Results:  Recent Labs  05/31/15 1813 06/01/15 0451  WBC 12.1* 11.6*  HGB 10.1* 10.6*  HCT 31.2* 32.2*  PLT 139* 135*   BMET:  Recent Labs  05/31/15 0400  05/31/15 1728 05/31/15 1813 06/01/15 0451  NA 131*  --  126*  --  127*  K 4.0  --  4.1  --  4.5  CL 97*  --  97*  --  90*  CO2 21*  --   --   --  21*  GLUCOSE 141*  --  137*  --  78  BUN 27*  --  29*  --  35*  CREATININE 4.85*  < > 4.70* 5.18* 5.58*  CALCIUM 8.0*  --   --   --  8.6*  < > = values in this interval not displayed.  PT/INR:  Recent Labs  05/30/15 1348  LABPROT 16.3*  INR 1.30   ABG    Component Value Date/Time    PHART 7.330* 05/30/2015 1947   HCO3 24.1* 05/30/2015 1947   TCO2 20 05/31/2015 1728   ACIDBASEDEF 2.0 05/30/2015 1947   O2SAT 98.0 05/30/2015 1947   CBG (last 3)   Recent Labs  06/01/15 0416 06/01/15 0418 06/01/15 0734  GLUCAP 63* 68 111*    Assessment/Plan: S/P Procedure(s) (LRB):  CORONARY ARTERY BYPASS GRAFTING (CABG) x 3 (LIMA to LAD, SVG to CIRCUMFLEX, and SVG to PDA) with EVH from right thigh greater saphenous vein and left internal mammary artery harvest (N/A) TRANSESOPHAGEAL ECHOCARDIOGRAM (TEE) (N/A) POD # 2  CV- in SR, stable  RESP- bibasilar atelectasis on CXR- IS  RENAL- ESRD. To have HD today  ENDO_ CBG low overnight, decrease lantus, change CBG to [facility] and HS  DVT prophylaxis- SCD + enoxaparin  Out of bed as tolerated, PT   LOS: 6 days    Loreli SlotSteven C Ahtziry Saathoff 06/01/2015

## 2015-06-02 ENCOUNTER — Encounter (HOSPITAL_COMMUNITY): Payer: Self-pay | Admitting: Cardiothoracic Surgery

## 2015-06-02 ENCOUNTER — Inpatient Hospital Stay (HOSPITAL_COMMUNITY): Payer: Medicare Other

## 2015-06-02 LAB — GLUCOSE, CAPILLARY
GLUCOSE-CAPILLARY: 62 mg/dL — AB (ref 65–99)
GLUCOSE-CAPILLARY: 100 mg/dL — AB (ref 65–99)
GLUCOSE-CAPILLARY: 132 mg/dL — AB (ref 65–99)
Glucose-Capillary: 199 mg/dL — ABNORMAL HIGH (ref 65–99)
Glucose-Capillary: 187 mg/dL — ABNORMAL HIGH (ref 65–99)

## 2015-06-02 LAB — TYPE AND SCREEN
ABO/RH(D): A POS
Antibody Screen: NEGATIVE
Unit division: 0
Unit division: 0
Unit division: 0
Unit division: 0
Unit division: 0
Unit division: 0
Unit division: 0
Unit division: 0

## 2015-06-02 LAB — RENAL FUNCTION PANEL
ANION GAP: 11 (ref 5–15)
Albumin: 1.8 g/dL — ABNORMAL LOW (ref 3.5–5.0)
BUN: 19 mg/dL (ref 6–20)
CHLORIDE: 94 mmol/L — AB (ref 101–111)
CO2: 27 mmol/L (ref 22–32)
CREATININE: 3.7 mg/dL — AB (ref 0.61–1.24)
Calcium: 7.8 mg/dL — ABNORMAL LOW (ref 8.9–10.3)
GFR, EST AFRICAN AMERICAN: 18 mL/min — AB (ref >60)
GFR, EST NON AFRICAN AMERICAN: 16 mL/min — AB (ref >60)
Glucose, Bld: 54 mg/dL — ABNORMAL LOW (ref 65–99)
POTASSIUM: 3.5 mmol/L (ref 3.5–5.1)
Phosphorus: 4.6 mg/dL (ref 2.5–4.6)
Sodium: 132 mmol/L — ABNORMAL LOW (ref 135–145)

## 2015-06-02 LAB — CBC
HEMATOCRIT: 30.6 % — AB (ref 39.0–52.0)
Hemoglobin: 10.2 g/dL — ABNORMAL LOW (ref 13.0–17.0)
MCH: 30.3 pg (ref 26.0–34.0)
MCHC: 33.3 g/dL (ref 30.0–36.0)
MCV: 90.8 fL (ref 78.0–100.0)
PLATELETS: 113 10*3/uL — AB (ref 150–400)
RBC: 3.37 MIL/uL — AB (ref 4.22–5.81)
RDW: 16.7 % — ABNORMAL HIGH (ref 11.5–15.5)
WBC: 9.8 10*3/uL (ref 4.0–10.5)

## 2015-06-02 MED ORDER — INSULIN DETEMIR 100 UNIT/ML ~~LOC~~ SOLN
8.0000 [IU] | Freq: Every day | SUBCUTANEOUS | Status: DC
  Filled 2015-06-02: qty 0.08

## 2015-06-02 MED ORDER — MAGNESIUM HYDROXIDE 400 MG/5ML PO SUSP: 30.0000 mL | Freq: Every day | ORAL | Status: DC | PRN

## 2015-06-02 MED ORDER — INFLUENZA VAC SPLIT QUAD 0.5 ML IM SUSY: 0.5000 mL | PREFILLED_SYRINGE | INTRAMUSCULAR | Status: DC | PRN

## 2015-06-02 MED ORDER — SODIUM CHLORIDE 0.9 % IJ SOLN
3.0000 mL | Freq: Two times a day (BID) | INTRAMUSCULAR | Status: DC
  Administered 2015-06-02 – 2015-06-03 (×2): 3 mL via INTRAVENOUS

## 2015-06-02 MED ORDER — MOVING RIGHT ALONG BOOK
Freq: Once | Status: AC
  Administered 2015-06-02: 20:00:00
  Filled 2015-06-02: qty 1

## 2015-06-02 MED ORDER — SODIUM CHLORIDE 0.9 % IJ SOLN: 3.0000 mL | INTRAMUSCULAR | Status: DC | PRN

## 2015-06-02 MED ORDER — SODIUM CHLORIDE 0.9 % IV SOLN
250.0000 mL | INTRAVENOUS | Status: DC | PRN
Start: 2015-06-02 — End: 2015-06-04

## 2015-06-02 MED ORDER — ALPRAZOLAM 0.5 MG PO TABS: 1.0000 mg | ORAL_TABLET | Freq: Every evening | ORAL | Status: DC | PRN

## 2015-06-02 MED FILL — Heparin Sodium (Porcine) Inj 1000 Unit/ML: INTRAMUSCULAR | Qty: 30 | Status: AC

## 2015-06-02 MED FILL — Potassium Chloride Inj 2 mEq/ML: INTRAVENOUS | Qty: 40 | Status: AC

## 2015-06-02 MED FILL — Magnesium Sulfate Inj 50%: INTRAMUSCULAR | Qty: 10 | Status: AC

## 2015-06-02 NOTE — Progress Notes (Addendum)
[facility] DAILY ICU PROGRESS NOTE                   [address]            [place] [id number]          [phone]   3 Days Post-Op Procedure(s) (LRB):  CORONARY ARTERY BYPASS GRAFTING (CABG) x 3 (LIMA to LAD, SVG to CIRCUMFLEX, and SVG to PDA) with EVH from right thigh greater saphenous vein and left internal mammary artery harvest (N/A) TRANSESOPHAGEAL ECHOCARDIOGRAM (TEE) (N/A)  Total Length of Stay:  LOS: 7 days   Subjective:  [name] states he is doing okay.  Had a rough day yesterday but feels better this morning.  Poor appetite states food isn't good  + BM  Objective: Vital signs in last 24 hours: Temp:  [97.5 F (36.4 C)-98.4 F (36.9 C)] 97.9 F (36.6 C) (10/17 0756) Pulse Rate:  [70-84] 73 (10/17 0700) Cardiac Rhythm:  [-] Normal sinus rhythm (10/17 0400) Resp:  [13-25] 21 (10/17 0700) BP: (81-129)/(48-110) 112/56 mmHg (10/17 0700) SpO2:  [92 %-99 %] 96 % (10/17 0700) Weight:  [193 lb 12.6 oz (87.9 kg)-198 lb 6.6 oz (90 kg)] 194 lb 0.1 oz (88 kg) (10/17 0500)  Filed Weights   06/01/15 1200 06/01/15 1630 06/02/15 0500  Weight: 198 lb 6.6 oz (90 kg) 193 lb 12.6 oz (87.9 kg) 194 lb 0.1 oz (88 kg)    Weight change: -1 lb 1.6 oz (-0.5 kg)   Intake/Output from previous day: 10/16 0701 - 10/17 0700 In: 840 [P.O.:600; I.V.:240] Out: 2001 [Stool:1]   Current Meds: Scheduled Meds: . acetaminophen  1,000 mg Oral 4 times per day   Or  . acetaminophen (TYLENOL) oral liquid 160 mg/5 mL  1,000 mg Per Tube 4 times per day  . aspirin EC  325 mg Oral Daily   Or  . aspirin  324 mg Per Tube Daily  . bisacodyl  10 mg Oral Daily   Or  . bisacodyl  10 mg Rectal Daily  . calcitRIOL  0.25 mcg Oral BID  . calcium acetate  667 mg Oral TID WC  . carvedilol  3.125 mg Oral BID WC  . cinacalcet  30 mg Oral QHS  . darbepoetin (ARANESP) injection - DIALYSIS  200 mcg Intravenous Q Tue-HD  . docusate sodium  200 mg Oral Daily  . enoxaparin (LOVENOX) injection  30 mg  Subcutaneous QHS  . escitalopram  20 mg Oral QHS  . fentaNYL  100 mcg Transdermal Q72H  . Influenza vac split quadrivalent PF  0.5 mL Intramuscular Tomorrow-1000  . insulin aspart  0-9 Units Subcutaneous TID WC  . insulin detemir  8 Units Subcutaneous QHS  . multivitamin  1 tablet Oral QHS  . pantoprazole  40 mg Oral Daily  . polyethylene glycol  8.5-17 g Oral QHS  . rosuvastatin  40 mg Oral QHS  . sodium chloride  3 mL Intravenous Q12H  . tamsulosin  0.4 mg Oral QHS   Continuous Infusions: . sodium chloride Stopped (05/31/15 1430)  . sodium chloride    . sodium chloride 10 mL/hr at 06/02/15 0400  . lactated ringers    . lactated ringers     PRN Meds:.sodium chloride, fentaNYL (SUBLIMAZE) injection, HYDROmorphone, lactated ringers, LORazepam, menthol-cetylpyridinium, metoprolol, morphine injection, ondansetron (ZOFRAN) IV, phenol, sodium chloride  General appearance: alert, cooperative and no distress Heart: regular rate and rhythm Lungs: clear to auscultation bilaterally Abdomen: soft, non-tender; bowel  sounds normal; no masses,  no organomegaly Extremities: edema trace Wound: clean and dry  Lab Results: CBC: Recent Labs  06/01/15 0451 06/02/15 0600  WBC 11.6* 9.8  HGB 10.6* 10.2*  HCT 32.2* 30.6*  PLT 135* PENDING   BMET:  Recent Labs  06/01/15 0451 06/02/15 0600  NA 127* 132*  K 4.5 3.5  CL 90* 94*  CO2 21* 27  GLUCOSE 78 54*  BUN 35* 19  CREATININE 5.58* 3.70*  CALCIUM 8.6* 7.8*    PT/INR:  Recent Labs  05/30/15 1348  LABPROT 16.3*  INR 1.30   Radiology: Dg Chest Port 1 View  06/02/2015  CLINICAL DATA:  Status post CABG. EXAM: PORTABLE CHEST 1 VIEW COMPARISON:  Chest radiograph from one day prior. FINDINGS: Right internal jugular central venous sheath terminates in the upper third of the superior vena cava. Left subclavian central venous catheter terminates over the upper right atrium. Median sternotomy wires are aligned and intact. Stable  cardiomediastinal silhouette with mild cardiomegaly. No pneumothorax. Stable slight blunting of the right costophrenic angle, suggesting a trace right pleural effusion. No left pleural effusion. Stable mild to moderate bibasilar atelectasis. No overt pulmonary edema. IMPRESSION: 1. Stable mild cardiomegaly without overt pulmonary edema. 2. Stable trace right pleural effusion. 3. Stable mild to moderate bibasilar atelectasis. Electronically Signed   By: Delbert Phenix M.D.   On: 06/02/2015 07:41     Assessment/Plan: S/P Procedure(s) (LRB):  CORONARY ARTERY BYPASS GRAFTING (CABG) x 3 (LIMA to LAD, SVG to CIRCUMFLEX, and SVG to PDA) with EVH from right thigh greater saphenous vein and left internal mammary artery harvest (N/A) TRANSESOPHAGEAL ECHOCARDIOGRAM (TEE) (N/A)  1. CV- hemodynamically stable off all drips- continue Coreg 2. PUlm- no acute issues, off oxygen, continue IS 3. Renal- ESRD, dialysis today 4. DM- hypoglycemia- poor PO intake, will d/c Levemir, continue SSIP for now- will add insulin as needed once appetite improves 5. Dispo- patient stable, dialysis today, watch CBGS adjust insulin as appetite improves, possibly transfer to step down soon     Raford Pitcher, ERIN 06/02/2015 7:58 AM  Progressing but slowly due to chronic neuromuscular degenerative problem Tolerating hemodialysis today We'll transfer to step down bed after HD Maintaining sinus rhythm Chest x-ray with small effusions patient examined and medical record reviewed,agree with above note. Kathlee Nations Trigt III 06/02/2015

## 2015-06-02 NOTE — Progress Notes (Addendum)
We continue to recommend an order for an inpt rehab consult. Please order if you would like us to assess for admission. [phone] Also recommend OT eval.

## 2015-06-02 NOTE — Progress Notes (Signed)
Physical Therapy Treatment Patient Details Name: Daniel Anthony MRN: [id number] DOB: 1951-02-08 Today's Date: 06/02/2015    History of Present Illness pt is a 64 year old Caucasian male with history of coronary artery disease and prior stents, DM, CIPD, foot drop, ESRD  . Transferred from outside hospital for non-ST elevation MI.  Cath shows 3 vessel ds.  s/p CABG 10/14.    PT Comments    Patient now s/p CABG. Requires assist of 2 to stand from low surfaces due to chronic weakness in BLEs and inability to use UEs 2/2 to sternal precautions. Recommend pt bring AFOs and shoes to assist with gait training tomorrow. Education re: positioning and techniques for pressure relief as pt states he likes sitting in chair for long periods. Will plan for gait training tomorrow with use of RW for support and provide handout. Will follow acutely per current POC.   Follow Up Recommendations  CIR     Equipment Recommendations  None recommended by PT    Recommendations for Other Services       Precautions / Restrictions Precautions Precautions: Fall;Sternal Restrictions Weight Bearing Restrictions: No (sternal precautions) Other Position/Activity Restrictions: Has Bil AFOs- to bring from home    Mobility  Bed Mobility               General bed mobility comments: Sitting in chair upon P Tarrival.   Transfers Overall transfer level: Needs assistance Equipment used: None Transfers: Sit to/from Stand Sit to Stand: Max assist;+2 physical assistance;Mod assist         General transfer comment: Cues for use of body momentum and anterior translation to boost up from chair. Manual assist for foot placement 2/2 to tight heel cords. Stood from Landscape architect. No use of arms.  Ambulation/Gait Ambulation/Gait assistance: Mod assist;+2 physical assistance Ambulation Distance (Feet): 8 Feet Assistive device: 2 person hand held assist Gait Pattern/deviations: Step-through pattern;Decreased  dorsiflexion - right;Decreased dorsiflexion - left;Decreased stride length;Leaning posteriorly   Gait velocity interpretation: Below normal speed for age/gender General Gait Details: Slow, unsteady gait with posterior lean. Decreased foot clearance bil with knee recurvatum in stance. Increased back pain with static standing/ambulation.   Stairs            Wheelchair Mobility    Modified Rankin (Stroke Patients Only)       Balance Overall balance assessment: Needs assistance Sitting-balance support: Feet supported;No upper extremity supported Sitting balance-Leahy Scale: Fair Sitting balance - Comments: Able to scoot bottom to edge of chair asymmetrically without useof UEs.   Standing balance support: During functional activity Standing balance-Leahy Scale: Poor Standing balance comment: Relient on hand held support of 2 for balance.                    Cognition Arousal/Alertness: Awake/alert Behavior During Therapy: WFL for tasks assessed/performed Overall Cognitive Status: Within Functional Limits for tasks assessed                      Exercises General Exercises - Lower Extremity Long Arc Quad: Both;10 reps;Seated    General Comments        Pertinent Vitals/Pain Pain Assessment: Faces Faces Pain Scale: Hurts little more Pain Location: back with ambulation Pain Descriptors / Indicators: Sore Pain Intervention(s): Monitored during session;Repositioned;Premedicated before session    Home Living                      Prior Function  PT Goals (current goals can now be found in the care plan section) Progress towards PT goals: Progressing toward goals    Frequency  Min 3X/week    PT Plan Current plan remains appropriate    Co-evaluation             End of Session Equipment Utilized During Treatment: Gait belt Activity Tolerance: Patient tolerated treatment well;Patient limited by fatigue Patient left: in  chair;with call bell/phone within reach     Time: 1510-1545 PT Time Calculation (min) (ACUTE ONLY): 35 min  Charges:  $Gait Training: 8-22 mins $Therapeutic Activity: 8-22 mins                    G Codes:      Valrie Jia A Cong Hightower 06/02/2015, 4:04 PM Mylo RedShauna Rani Idler, PT, DPT [phone]

## 2015-06-02 NOTE — Progress Notes (Addendum)
EVENING ROUNDS NOTE :     [address]       [place] [id number]             [phone]                 3 Days Post-Op Procedure(s) (LRB):  CORONARY ARTERY BYPASS GRAFTING (CABG) x 3 (LIMA to LAD, SVG to CIRCUMFLEX, and SVG to PDA) with EVH from right thigh greater saphenous vein and left internal mammary artery harvest (N/A) TRANSESOPHAGEAL ECHOCARDIOGRAM (TEE) (N/A)  Total Length of Stay:  LOS: 7 days  BP 104/55 mmHg  Pulse 79  Temp(Src) 98.7 F (37.1 C) (Oral)  Resp 17  Ht 5\' 8"  (1.727 m)  Wt 189 lb 13.1 oz (86.1 kg)  BMI 28.87 kg/m2  SpO2 98%  .Intake/Output      10/17 0701 - 10/18 0700   P.O. 120   I.V. (mL/kg) 33 (0.4)   Total Intake(mL/kg) 153 (1.8)   Urine (mL/kg/hr)    Other 2500 (2)   Stool 0 (0)   Total Output 2500   Net -2347       Stool Occurrence 1 x     . sodium chloride Stopped (05/31/15 1430)  . sodium chloride Stopped (06/02/15 1000)     Lab Results  Component Value Date   WBC 9.8 06/02/2015   HGB 10.2* 06/02/2015   HCT 30.6* 06/02/2015   PLT 113* 06/02/2015   GLUCOSE 54* 06/02/2015   ALT 124* 05/28/2015   AST 69* 05/28/2015   NA 132* 06/02/2015   K 3.5 06/02/2015   CL 94* 06/02/2015   CREATININE 3.70* 06/02/2015   BUN 19 06/02/2015   CO2 27 06/02/2015   TSH 0.583 05/26/2015   PSA 4.3* 03/03/2015   INR 1.30 05/30/2015   HGBA1C 9.3* 05/30/2015    Remains stable   Daniel Anthony,Daniel Anthony, Daniel Anthony 06/02/2015 9:39 PM  Waiting for 2w bed I have seen and examined Daniel Anthony and agree with the above assessment  and plan.  Delight OvensEdward B Danamarie Minami MD [facility]per [phone] Office [phone] 06/03/2015 12:05 AM

## 2015-06-02 NOTE — Progress Notes (Addendum)
[facility] Progress Note   Subjective: alert, no complaints, no CP/ sob  Filed Vitals:   06/02/15 1000 06/02/15 1030 06/02/15 1100 06/02/15 1130  BP: 136/63 138/64 154/74 107/84  Pulse: 82 83 86 86  Temp:      TempSrc:      Resp: Height:      Weight:      SpO2: 99% 99% 97% 96%   Exam: Alert, no distress Chest clear ant / lat RRR no rub Abd soft / ntnd 1+ LE edema Neuro alert nf LFA AVF +bruit   Home HD / K Machine - MWF 4h  79kg  2/2.25 bath  LUE AVF   Calc 0.25 ug daily Mircera yes?  Last P 6.2, pth 549, tsat 53, ferr 935      Assessment: 1 ESRD on home HD, cont MWF here 2 CABG / NSTEMI/ ACS - 10/14, stable 3 Vol excess up 8 kg by wts 4 Anemia cont esa 5 MBD cont meds  Plan - HD today, max UF as tol. Extra HD tues for volume.    Daniel Moselle MD [facility] pager [phone]    cell [phone] 06/02/2015, 11:59 AM    Recent Labs Lab 05/29/15 0759  05/31/15 0400  05/31/15 1728 05/31/15 1813 06/01/15 0451 06/02/15 0600  NA 144  < > 131*  --  126*  --  127* 132*  K 3.1*  < > 4.0  --  4.1  --  4.5 3.5  CL 101  < > 97*  --  97*  --  90* 94*  CO2 27  < > 21*  --   --   --  21* 27  GLUCOSE 79  < > 141*  --  137*  --  78 54*  BUN 19  < > 27*  --  29*  --  35* 19  CREATININE 3.45*  < > 4.85*  < > 4.70* 5.18* 5.58* 3.70*  CALCIUM 9.0  < > 8.0*  --   --   --  8.6* 7.8*  PHOS 3.0  --   --   --   --   --   --  4.6  < > = values in this interval not displayed.  Recent Labs Lab 05/27/15 0222 05/28/15 1016 05/29/15 0759 06/02/15 0600  AST 186* 69*  --   --   ALT 187* 124*  --   --   ALKPHOS 284* 276*  --   --   BILITOT 3.6* 1.9*  --   --   PROT 6.3* 6.2*  --   --   ALBUMIN 2.5* 2.3* 2.1* 1.8*    Recent Labs Lab 05/27/15 0222  05/31/15 1813 06/01/15 0451 06/02/15 0600  WBC 10.3  < > 12.1* 11.6* 9.8  NEUTROABS 8.8*  --   --   --   --   HGB 7.6*  < > 10.1* 10.6* 10.2*  HCT 22.8*  < > 31.2* 32.2* 30.6*   MCV 91.6  < > 91.5 91.2 90.8  PLT 203  < > 139* 135* 113*  < > = values in this interval not displayed. Marland Kitchen acetaminophen  1,000 mg Oral 4 times per day   Or  . acetaminophen (TYLENOL) oral liquid 160 mg/5 mL  1,000 mg Per Tube 4 times per day  . aspirin EC  325 mg Oral Daily   Or  . aspirin  324 mg Per Tube Daily  . bisacodyl  10 mg Oral Daily   Or  . bisacodyl  10 mg Rectal Daily  . calcitRIOL  0.25 mcg Oral BID  . calcium acetate  667 mg Oral TID WC  . carvedilol  3.125 mg Oral BID WC  . cinacalcet  30 mg Oral QHS  . darbepoetin (ARANESP) injection - DIALYSIS  200 mcg Intravenous Q Tue-HD  . docusate sodium  200 mg Oral Daily  . escitalopram  20 mg Oral QHS  . fentaNYL  100 mcg Transdermal Q72H  . Influenza vac split quadrivalent PF  0.5 mL Intramuscular Tomorrow-1000  . insulin aspart  0-9 Units Subcutaneous TID WC  . multivitamin  1 tablet Oral QHS  . pantoprazole  40 mg Oral Daily  . polyethylene glycol  8.5-17 g Oral QHS  . rosuvastatin  40 mg Oral QHS  . sodium chloride  3 mL Intravenous Q12H  . tamsulosin  0.4 mg Oral QHS   . sodium chloride Stopped (05/31/15 1430)  . sodium chloride 10 mL/hr at 06/02/15 0400  . lactated ringers     sodium chloride, HYDROmorphone, lactated ringers, LORazepam, menthol-cetylpyridinium, metoprolol, morphine injection, ondansetron (ZOFRAN) IV, phenol, sodium chloride

## 2015-06-02 NOTE — Care Management Important Message (Signed)
Important Message  Patient Details  Name: Daniel FurnishRobert M Lovings MRN: [id number] Date of Birth: November 29, 1950   Medicare Important Message Given:  Yes-third notification given    Kyla BalzarineShealy, Betta Balla Abena 06/02/2015, 11:45 AM

## 2015-06-03 ENCOUNTER — Inpatient Hospital Stay (HOSPITAL_COMMUNITY): Payer: Medicare Other

## 2015-06-03 ENCOUNTER — Telehealth: Payer: Self-pay | Admitting: *Deleted

## 2015-06-03 DIAGNOSIS — D638 Anemia in other chronic diseases classified elsewhere: Secondary | ICD-10-CM

## 2015-06-03 DIAGNOSIS — E1142 Type 2 diabetes mellitus with diabetic polyneuropathy: Secondary | ICD-10-CM

## 2015-06-03 DIAGNOSIS — Z992 Dependence on renal dialysis: Secondary | ICD-10-CM

## 2015-06-03 DIAGNOSIS — R5381 Other malaise: Secondary | ICD-10-CM

## 2015-06-03 DIAGNOSIS — N186 End stage renal disease: Secondary | ICD-10-CM

## 2015-06-03 DIAGNOSIS — G8929 Other chronic pain: Secondary | ICD-10-CM

## 2015-06-03 DIAGNOSIS — R269 Unspecified abnormalities of gait and mobility: Secondary | ICD-10-CM

## 2015-06-03 LAB — GLUCOSE, CAPILLARY
Glucose-Capillary: 159 mg/dL — ABNORMAL HIGH (ref 65–99)
Glucose-Capillary: 196 mg/dL — ABNORMAL HIGH (ref 65–99)
Glucose-Capillary: 215 mg/dL — ABNORMAL HIGH (ref 65–99)
Glucose-Capillary: 230 mg/dL — ABNORMAL HIGH (ref 65–99)

## 2015-06-03 LAB — COMPREHENSIVE METABOLIC PANEL
ALT: 57 U/L (ref 17–63)
AST: 54 U/L — ABNORMAL HIGH (ref 15–41)
Albumin: 1.9 g/dL — ABNORMAL LOW (ref 3.5–5.0)
Alkaline Phosphatase: 443 U/L — ABNORMAL HIGH (ref 38–126)
Anion gap: 12 (ref 5–15)
BUN: 19 mg/dL (ref 6–20)
CO2: 28 mmol/L (ref 22–32)
Calcium: 8.4 mg/dL — ABNORMAL LOW (ref 8.9–10.3)
Chloride: 91 mmol/L — ABNORMAL LOW (ref 101–111)
Creatinine, Ser: 3.92 mg/dL — ABNORMAL HIGH (ref 0.61–1.24)
GFR calc Af Amer: 17 mL/min — ABNORMAL LOW (ref >60)
GFR calc non Af Amer: 15 mL/min — ABNORMAL LOW (ref >60)
Glucose, Bld: 217 mg/dL — ABNORMAL HIGH (ref 65–99)
Potassium: 4.3 mmol/L (ref 3.5–5.1)
Sodium: 131 mmol/L — ABNORMAL LOW (ref 135–145)
Total Bilirubin: 1.8 mg/dL — ABNORMAL HIGH (ref 0.3–1.2)
Total Protein: 5.7 g/dL — ABNORMAL LOW (ref 6.5–8.1)

## 2015-06-03 LAB — CBC
HCT: 33.2 % — ABNORMAL LOW (ref 39.0–52.0)
Hemoglobin: 10.8 g/dL — ABNORMAL LOW (ref 13.0–17.0)
MCH: 30.9 pg (ref 26.0–34.0)
MCHC: 32.5 g/dL (ref 30.0–36.0)
MCV: 95.1 fL (ref 78.0–100.0)
Platelets: 152 10*3/uL (ref 150–400)
RBC: 3.49 MIL/uL — ABNORMAL LOW (ref 4.22–5.81)
RDW: 17.4 % — ABNORMAL HIGH (ref 11.5–15.5)
WBC: 9 10*3/uL (ref 4.0–10.5)

## 2015-06-03 MED ORDER — LIDOCAINE HCL (PF) 1 % IJ SOLN: 5.0000 mL | INTRAMUSCULAR | Status: DC | PRN

## 2015-06-03 MED ORDER — HEPARIN SODIUM (PORCINE) 1000 UNIT/ML DIALYSIS: 1000.0000 [IU] | INTRAMUSCULAR | Status: DC | PRN

## 2015-06-03 MED ORDER — SODIUM CHLORIDE 0.9 % IV SOLN: 100.0000 mL | INTRAVENOUS | Status: DC | PRN

## 2015-06-03 MED ORDER — LIDOCAINE-PRILOCAINE 2.5-2.5 % EX CREA: 1.0000 "application " | TOPICAL_CREAM | CUTANEOUS | Status: DC | PRN

## 2015-06-03 MED ORDER — PENTAFLUOROPROP-TETRAFLUOROETH EX AERO: 1.0000 "application " | INHALATION_SPRAY | CUTANEOUS | Status: DC | PRN

## 2015-06-03 MED ORDER — DARBEPOETIN ALFA 200 MCG/0.4ML IJ SOSY
PREFILLED_SYRINGE | INTRAMUSCULAR | Status: AC
  Filled 2015-06-03: qty 0.4

## 2015-06-03 MED ORDER — ALTEPLASE 2 MG IJ SOLR
2.0000 mg | Freq: Once | INTRAMUSCULAR | Status: DC | PRN
Start: 2015-06-03 — End: 2015-06-03

## 2015-06-03 MED ORDER — LIDOCAINE-PRILOCAINE 2.5-2.5 % EX CREA
1.0000 | TOPICAL_CREAM | CUTANEOUS | Status: DC | PRN
Start: 2015-06-03 — End: 2015-06-04
  Filled 2015-06-03: qty 5

## 2015-06-03 MED ORDER — ALTEPLASE 2 MG IJ SOLR
2.0000 mg | Freq: Once | INTRAMUSCULAR | Status: DC | PRN
  Filled 2015-06-03: qty 2

## 2015-06-03 MED ORDER — SODIUM CHLORIDE 0.9 % IJ SOLN
10.0000 mL | INTRAMUSCULAR | Status: DC | PRN
  Administered 2015-06-03 (×2): 30 mL
  Administered 2015-06-05: 10 mL
  Administered 2015-06-06 – 2015-06-07 (×2): 30 mL
  Filled 2015-06-03 (×5): qty 40

## 2015-06-03 MED ORDER — HEPARIN SODIUM (PORCINE) 1000 UNIT/ML DIALYSIS: 1500.0000 [IU] | INTRAMUSCULAR | Status: DC | PRN

## 2015-06-03 NOTE — Consult Note (Signed)
Physical Medicine and Rehabilitation Consult Reason for Consult: Deconditioning/NSTEMI Referring Physician: Dr. Zenaida Niece trigt   HPI: Daniel Anthony is a 64 y.o. right hand male with history of CAD/PTCA, end-stage renal disease hemodialysis, CIDP with bilateral foot drop. Patient lives with spouse one level home 2 steps to entry. Children live next door. Independent with assistive device and bilateral AFOs prior to admission. Presented 05/27/2015 from outside hospital with NSTEMI. Echocardiogram with ejection fraction of 35% no evidence of vegetation. Cardiac catheterization demonstrated high-grade stenosis of previously placed stents. Underwent CABG 3 05/30/2015 per Dr. Zenaida Niece trigt. Patient remained intubated postop. Hospital course pain management. Acute blood loss anemia 10.8 and monitored. Hemodialysis ongoing as per renal services. Sternal precautions as indicated. Physical therapy evaluation completed recommendations for physical medicine rehabilitation consult.  Pt lives with his wife, who is able to provide 24/7 support on discharge.     Review of Systems  Constitutional: Negative for fever and chills.  HENT: Negative for hearing loss.   Eyes: Negative for blurred vision and double vision.  Respiratory: Negative for cough and shortness of breath.   Cardiovascular: Positive for palpitations and leg swelling.  Gastrointestinal: Positive for nausea and constipation. Negative for vomiting.  Genitourinary: Negative for dysuria and hematuria.       Oligouric  Musculoskeletal: Positive for myalgias.  Skin: Negative for rash.  Neurological: Positive for sensory change (Numbness in hands and feet) and weakness. Negative for seizures, loss of consciousness and headaches.  Psychiatric/Behavioral: Positive for depression.  All other systems reviewed and are negative.  Past Medical History  Diagnosis Date  . Bradycardia     Hx of  . Pleural effusion, right   . Hypomagnesemia   .  Hypertension   . Diabetes mellitus   . Hyperlipidemia   . Coronary artery disease   . 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 MI (myocardial infarction) (HCC) 1995    anterior  . CIDP (chronic inflammatory demyelinating polyneuropathy) (HCC)   . Polyneuropathy in diabetes(357.2) 12/13/2012  . Abnormality of gait   . Foot drop, bilateral 05/10/2013  . Kidney disease   . End-stage renal disease (HCC)   . Dialysis patient [facility])    Past Surgical History  Procedure Laterality Date  . Tonsillectomy    . Cardiac catheterization  1995    2 stents   . Coronary angioplasty with stent placement  1995  . Lithotripsy    . Cardiac catheterization N/A 05/26/2015    Procedure: Left Heart Cath and Coronary Angiography;  Surgeon: Kathleene Hazel, MD;  Location: [facility] INVASIVE CV LAB;  Service: Cardiovascular;  Laterality: N/A;  . Coronary artery bypass graft N/A 05/30/2015    Procedure:  CORONARY ARTERY BYPASS GRAFTING (CABG) x 3 (LIMA to LAD, SVG to CIRCUMFLEX, and SVG to PDA) with EVH from right thigh greater saphenous vein and left internal mammary artery harvest;  Surgeon: Kerin Perna, MD;  Location: [facility] OR;  Service: Open Heart Surgery;  Laterality: N/A;  . Tee without cardioversion N/A 05/30/2015    Procedure: TRANSESOPHAGEAL ECHOCARDIOGRAM (TEE);  Surgeon: Kerin Perna, MD;  Location: [facility] OR;  Service: Open Heart Surgery;  Laterality: N/A;   Family History  Problem Relation Age of Onset  . Heart attack Father   . Hypertension Father   . Diabetes Father   . Cancer Father     prostate  . Hypertension Mother   . Cancer Maternal Grandmother  colon   Social History:  reports that he quit smoking about 35 years ago. His smoking use included Cigarettes. He has never used smokeless tobacco. He reports that he does not drink alcohol or use illicit drugs. Allergies:  Allergies  Allergen Reactions  . Zosyn [Piperacillin Sod-Tazobactam So] Itching and Rash    Medications Prior to Admission  Medication Sig Dispense Refill  . aspirin 325 MG tablet Take 325 mg by mouth at bedtime.     . calcitRIOL (ROCALTROL) 0.25 MCG capsule Take 0.5 mcg by mouth at bedtime.     . calcium acetate (PHOSLO) 667 MG capsule Take 1,334-2,001 mg by mouth See admin instructions. Take 3 capsules (2001 mg) by mouth 2 times daily before meals, and take 2 capsules (1334) daily before a snack    . cinacalcet (SENSIPAR) 30 MG tablet Take 30 mg by mouth at bedtime.     . docusate sodium (COLACE) 100 MG capsule Take 200 mg by mouth at bedtime.    Marland Kitchen escitalopram (LEXAPRO) 20 MG tablet TAKE 1 TABLET BY MOUTH EVERY DAY (Patient taking differently: TAKE 1 TABLET BY MOUTH EVERY DAY AT BEDTIME) 90 tablet 0  . fentaNYL (DURAGESIC - DOSED MCG/HR) 100 MCG/HR Place 100 mcg onto the skin every 3 (three) days.    . fluconazole (DIFLUCAN) 150 MG tablet Take 150 mg by mouth See admin instructions. Take 1 tablet (150 mg) by mouth for 3 days as needed for jock itch    . insulin aspart (NOVOLOG FLEXPEN) 100 UNIT/ML FlexPen Inject 12-14 Units into the skin daily with lunch. Based on meal (carb count)    . insulin regular (NOVOLIN R,HUMULIN R) 100 units/mL injection Inject 12 Units into the skin once as needed for high blood sugar (CBG >600).    . LEVEMIR FLEXTOUCH 100 UNIT/ML Pen INJECT 45 UNITS UNDER THE SKIN EVERY NIGHT AT BEDTIME (Patient taking differently: INJECT 42 UNITS UNDER THE SKIN EVERY NIGHT AT BEDTIME) 15 mL 12  . lidocaine-prilocaine (EMLA) cream Apply 2.5 application topically 3 (three) times a week. Apply to dialysis site one hour before treatment    . LORazepam (ATIVAN) 0.5 MG tablet Take 0.5 mg by mouth 3 (three) times daily as needed for anxiety.     . multivitamin (RENA-VIT) TABS tablet Take 1 tablet by mouth at bedtime.     . naproxen sodium (ALEVE) 220 MG tablet Take 220-440 mg by mouth daily as needed (pain).    . nitroGLYCERIN (NITROSTAT) 0.4 MG SL tablet Place 1 tablet (0.4 mg  total) under the tongue every 5 (five) minutes as needed for chest pain. 50 tablet 12  . polyethylene glycol (MIRALAX / GLYCOLAX) packet Take 8.5-17 g by mouth at bedtime.     . Polyvinyl Alcohol (LIQUID TEARS OP) Place 1 drop into both eyes 2 (two) times daily as needed (dry eyes post procedure (every 2 months)).    . rosuvastatin (CRESTOR) 40 MG tablet Take 1 tablet (40 mg total) by mouth 3 (three) times a week. (Patient taking differently: Take 40 mg by mouth at bedtime. ) 30 tablet 1  . tamsulosin (FLOMAX) 0.4 MG CAPS capsule TAKE 1 CAPSULE BY MOUTH EVERY DAY (Patient taking differently: TAKE 1 CAPSULE BY MOUTH EVERY DAY AT BEDTIME) 90 capsule 3  . amLODipine (NORVASC) 10 MG tablet Take 10 mg by mouth at bedtime.     . carvedilol (COREG) 25 MG tablet Take 25 mg by mouth at bedtime.       Home: Home Living  Family/patient expects to be discharged to:: Private residence Living Arrangements: Spouse/significant other Available Help at Discharge: Family, Available 24 hours/day (children live next door.) Type of Home: House Home Access: Stairs to enter Entergy CorporationEntrance Stairs-Number of Steps: 2/1 Entrance Stairs-Rails: Right, Left Home Layout: One level Bathroom Shower/Tub: Other (comment) (walk in tub) Bathroom Toilet:  (comfort height) Home Equipment: Wheelchair - manual, Environmental consultantWalker - 2 wheels, Cane - single point, Bedside commode  Functional History: Prior Function Level of Independence: Independent with assistive device(s) Comments: Walked in home very short distances with cane and no assist, assist into tub and then bathes himself, standing for brushing teeth and combing hair..  W/C for out running errands with wife.  No longer drives. Functional Status:  Mobility: Bed Mobility Overal bed mobility: Needs Assistance Bed Mobility: Rolling, Sidelying to Sit, Sit to Supine Rolling: Min guard Sidelying to sit: Min assist Sit to supine: Min guard General bed mobility comments: Sitting in chair upon  P Tarrival.  Transfers Overall transfer level: Needs assistance Equipment used: None Transfers: Sit to/from Stand Sit to Stand: Max assist, +2 physical assistance, Mod assist General transfer comment: Cues for use of body momentum and anterior translation to boost up from chair. Manual assist for foot placement 2/2 to tight heel cords. Stood from Landscape architectchair x2. No use of arms. Ambulation/Gait Ambulation/Gait assistance: Mod assist, +2 physical assistance Ambulation Distance (Feet): 8 Feet Assistive device: 2 person hand held assist Gait Pattern/deviations: Step-through pattern, Decreased dorsiflexion - right, Decreased dorsiflexion - left, Decreased stride length, Leaning posteriorly General Gait Details: Slow, unsteady gait with posterior lean. Decreased foot clearance bil with knee recurvatum in stance. Increased back pain with static standing/ambulation. Gait velocity interpretation: Below normal speed for age/gender    ADL:    Cognition: Cognition Overall Cognitive Status: Within Functional Limits for tasks assessed Orientation Level: Oriented X4 Cognition Arousal/Alertness: Awake/alert Behavior During Therapy: WFL for tasks assessed/performed Overall Cognitive Status: Within Functional Limits for tasks assessed  Blood pressure 120/67, pulse 88, temperature 98.6 F (37 C), temperature source Oral, resp. rate 14, height 5\' 9"  (1.753 m), weight 83.553 kg (184 lb 3.2 oz), SpO2 98 %. Physical Exam  Vitals reviewed. Constitutional: He is oriented to person, place, and time. He appears well-developed and well-nourished.  HENT:  Head: Normocephalic and atraumatic.  Eyes: Conjunctivae and EOM are normal.  Neck: Normal range of motion. Neck supple. No thyromegaly present.  Cardiovascular: Normal rate.   Cardiac rate controlled  Respiratory: Effort normal and breath sounds normal.  GI: Soft. Bowel sounds are normal. He exhibits no distension.  Musculoskeletal: He exhibits no edema or  tenderness.  Strength 4/5 grossly throughout, except for ankle dorsi/plantar flexion  Neurological: He is alert and oriented to person, place, and time.  Decreased sensation b/l hands and feet  Skin: Skin is warm and dry.  Midline chest incision healing  Psychiatric: He has a normal mood and affect. His behavior is normal.    Results for orders placed or performed during the hospital encounter of 05/26/15 (from the past 24 hour(s))  Glucose, capillary     Status: Abnormal   Collection Time: 06/02/15  4:45 PM  Result Value Ref Range   Glucose-Capillary 199 (H) 65 - 99 mg/dL   Comment 1 Capillary Specimen    Comment 2 Notify RN   Glucose, capillary     Status: Abnormal   Collection Time: 06/02/15  9:56 PM  Result Value Ref Range   Glucose-Capillary 187 (H) 65 - 99 mg/dL  Comment 1 Notify RN    Comment 2 Document in Chart   Glucose, capillary     Status: Abnormal   Collection Time: 06/03/15  6:08 AM  Result Value Ref Range   Glucose-Capillary 159 (H) 65 - 99 mg/dL  CBC     Status: Abnormal   Collection Time: 06/03/15  7:45 AM  Result Value Ref Range   WBC 9.0 4.0 - 10.5 K/uL   RBC 3.49 (L) 4.22 - 5.81 MIL/uL   Hemoglobin 10.8 (L) 13.0 - 17.0 g/dL   HCT 40.9 (L) 81.1 - 91.4 %   MCV 95.1 78.0 - 100.0 fL   MCH 30.9 26.0 - 34.0 pg   MCHC 32.5 30.0 - 36.0 g/dL   RDW 78.2 (H) 95.6 - 21.3 %   Platelets 152 150 - 400 K/uL  Comprehensive metabolic panel     Status: Abnormal   Collection Time: 06/03/15  7:45 AM  Result Value Ref Range   Sodium 131 (L) 135 - 145 mmol/L   Potassium 4.3 3.5 - 5.1 mmol/L   Chloride 91 (L) 101 - 111 mmol/L   CO2 28 22 - 32 mmol/L   Glucose, Bld 217 (H) 65 - 99 mg/dL   BUN 19 6 - 20 mg/dL   Creatinine, Ser 0.86 (H) 0.61 - 1.24 mg/dL   Calcium 8.4 (L) 8.9 - 10.3 mg/dL   Total Protein 5.7 (L) 6.5 - 8.1 g/dL   Albumin 1.9 (L) 3.5 - 5.0 g/dL   AST 54 (H) 15 - 41 U/L   ALT 57 17 - 63 U/L   Alkaline Phosphatase 443 (H) 38 - 126 U/L   Total Bilirubin 1.8  (H) 0.3 - 1.2 mg/dL   GFR calc non Af Amer 15 (L) >60 mL/min   GFR calc Af Amer 17 (L) >60 mL/min   Anion gap 12 5 - 15  Glucose, capillary     Status: Abnormal   Collection Time: 06/03/15 11:04 AM  Result Value Ref Range   Glucose-Capillary 196 (H) 65 - 99 mg/dL   Comment 1 Notify RN    Dg Chest Port 1 View  06/03/2015  CLINICAL DATA:  Patient with shortness of breath. EXAM: PORTABLE CHEST 1 VIEW COMPARISON:  Chest radiograph 06/02/2015 FINDINGS: Left subclavian central venous catheter tip projects over the superior vena cava. Interval removal right IJ central venous catheter. Monitoring leads overlie the patient. Stable cardiomegaly status post median sternotomy and CABG procedure. Low lung volumes. Stable small bilateral pleural effusions with left-greater-than-right basilar heterogeneous pulmonary opacities. No pneumothorax. IMPRESSION: Left subclavian central venous catheter tip projects over the superior vena cava. Cardiomegaly. Bilateral small pleural effusions with left-greater-than-right basilar heterogeneous opacities. Electronically Signed   By: Annia Belt M.D.   On: 06/03/2015 08:24   Dg Chest Port 1 View  06/02/2015  CLINICAL DATA:  Status post CABG. EXAM: PORTABLE CHEST 1 VIEW COMPARISON:  Chest radiograph from one day prior. FINDINGS: Right internal jugular central venous sheath terminates in the upper third of the superior vena cava. Left subclavian central venous catheter terminates over the upper right atrium. Median sternotomy wires are aligned and intact. Stable cardiomediastinal silhouette with mild cardiomegaly. No pneumothorax. Stable slight blunting of the right costophrenic angle, suggesting a trace right pleural effusion. No left pleural effusion. Stable mild to moderate bibasilar atelectasis. No overt pulmonary edema. IMPRESSION: 1. Stable mild cardiomegaly without overt pulmonary edema. 2. Stable trace right pleural effusion. 3. Stable mild to moderate bibasilar  atelectasis. Electronically Signed  By: Delbert Phenix M.D.   On: 06/02/2015 07:41    Assessment/Plan: Diagnosis: Debility Labs and images independently reviewed.  Old records reviewed and summated above.  1. Does the need for close, 24 hr/day medical supervision in concert with the patient's rehab needs make it unreasonable for this patient to be served in a less intensive setting? Yes 2. Co-Morbidities requiring supervision/potential complications: Anemia (Cont to monitor, transfuse if necessary, especially if pt become symptomatic as activity increases), ESRD (Cont HD), CAD (Cont meds), pain (Cont oral meds), peripheral neuropathy (Cont meds and education), CIDP 3. Due to safety, skin/wound care, disease management, medication administration, pain management and patient education, does the patient require 24 hr/day rehab nursing? Potentially 4. Does the patient require coordinated care of a physician, rehab nurse, PT (1-2 hrs/day, 5 days/week) and OT (1-2 hrs/day, 5 days/week) to address physical and functional deficits in the context of the above medical diagnosis(es)? Yes Addressing deficits in the following areas: balance, endurance, locomotion, strength, transferring, dressing, toileting and psychosocial support 5. Can the patient actively participate in an intensive therapy program of at least 3 hrs of therapy per day at least 5 days per week? Yes 6. The potential for patient to make measurable gains while on inpatient rehab is good 7. Anticipated functional outcomes upon discharge from inpatient rehab are modified independent and supervision  with PT, modified independent and supervision with OT, n/a with SLP. 8. Estimated rehab length of stay to reach the above functional goals is: 10-14. 9. Does the patient have adequate social supports and living environment to accommodate these discharge functional goals? Yes 10. Anticipated D/C setting: Home 11. Anticipated post D/C treatments: HH  therapy and Home excercise program 12. Overall Rehab/Functional Prognosis: good  RECOMMENDATIONS: This patient's condition is appropriate for continued rehabilitative care in the following setting: CIR Patient has agreed to participate in recommended program. Yes Note that insurance prior authorization may be required for reimbursement for recommended care.  Comment: Rehab Admissions Coordinator to follow up.  Maryla Morrow, MD 06/03/2015

## 2015-06-03 NOTE — Progress Notes (Signed)
Epicardial pacing wires removed per protocol and order. Tips intact. Pt and family educated on need for vitals q15 and bedrest for 1 hour. Pt tolerated procedure well. Will continue to monitor.

## 2015-06-03 NOTE — Discharge Summary (Signed)
Physician Discharge Summary  Patient ID: Daniel Anthony MRN: [id number] DOB/AGE: 64-Dec-1952 64 y.o.  Admit date: 05/26/2015 Discharge date: 06/09/2015  Admission Diagnoses:  Patient Active Problem List   Diagnosis Date Noted  . Ventricular bigeminy 06/05/2015  . Cardiomyopathy, ischemic 05/27/2015  . Anemia of chronic disease 05/27/2015  . Chronic pain 05/27/2015  . Non-STEMI (non-ST elevated myocardial infarction) ([place]) 05/26/2015  . Coronary artery disease involving native coronary artery of native heart with unstable angina pectoris ([place])   . Radiculopathy with lower extremity symptoms 10/14/2014  . Orthostatic hypotension 02/07/2014  . Fever 08/16/2013  . Altered mental status 08/16/2013  . ESRD (end stage renal disease) ([place]) 08/16/2013  . Leukocytosis 08/16/2013  . Elevated liver function tests 08/16/2013  . Viral intestinal infection 08/16/2013  . HTN (hypertension) 07/23/2013  . Abnormality of gait 05/10/2013  . Foot drop, bilateral 05/10/2013  . Diabetic polyneuropathy ([place]) 12/13/2012  . Intractable vomiting 10/24/2012  . CIDP (chronic inflammatory demyelinating polyneuropathy) ([place]) 10/24/2012  . Throat tightness 12/23/2011  . SOB (shortness of breath) 12/22/2010  . DM (diabetes mellitus), type 2, uncontrolled, with renal complications ([place]) 35/32/9924  . Hyperlipemia 12/22/2010  . CAD (coronary artery disease) 12/22/2010   Discharge Diagnoses:   Patient Active Problem List   Diagnosis Date Noted  . Ventricular bigeminy 06/05/2015  . Cardiomyopathy, ischemic 05/27/2015  . Anemia of chronic disease 05/27/2015  . Chronic pain 05/27/2015  . Non-STEMI (non-ST elevated myocardial infarction) ([place]) 05/26/2015  . Coronary artery disease involving native coronary artery of native heart with unstable angina pectoris ([place])   . Radiculopathy with lower extremity symptoms 10/14/2014  . Orthostatic hypotension 02/07/2014  . Fever 08/16/2013  . Altered mental status  08/16/2013  . ESRD (end stage renal disease) ([place]) 08/16/2013  . Leukocytosis 08/16/2013  . Elevated liver function tests 08/16/2013  . Viral intestinal infection 08/16/2013  . HTN (hypertension) 07/23/2013  . Abnormality of gait 05/10/2013  . Foot drop, bilateral 05/10/2013  . Diabetic polyneuropathy ([place]) 12/13/2012  . Intractable vomiting 10/24/2012  . CIDP (chronic inflammatory demyelinating polyneuropathy) ([place]) 10/24/2012  . Throat tightness 12/23/2011  . SOB (shortness of breath) 12/22/2010  . DM (diabetes mellitus), type 2, uncontrolled, with renal complications ([place]) 26/83/4196  . Hyperlipemia 12/22/2010  . CAD (coronary artery disease) 12/22/2010  Ventricular Bigeminy Elevated LFTs and alkaline phosphatase  Discharged Condition: good  History of Present Illness:  [name] is a 64 year old male with a past medical history of ESRD on home hemodialysis, also diabetes mellitus, hypertension, hyperlipidemia and chronic inflammatory demyelinating polyneuropathy. He has a known history of coronary artery disease, status post anterior MI in 1995 with a stent to his mid left circumflex at that time. His last catheterization in 2006 showed the stent to be patent with minimal irregularities in the LAD and normal LV function. Stress test in 2012 showed no significant ischemia and echocardiogram in February 2012 was essentially normal.   Over the past few weeks, he has been having episodic right jaw pain and chest discomfort. Over the past 2 weeks, this has become more frequent, occurring during the night and after eating or drinking. He does have diabetic gastroparesis and takes Reglan and Nexium but says that that has been under good control. He initially thought he had trigeminal neuralgia, which would usually subside with warm heat but recently it has not improved with this and has been occurring with associated chest discomfort. He is very inactive due to his demyelinating disease  so difficult  to discern if there is an exertional component. He used to have problems with his BP being very high, but recently he has had issues with it running low in the 90's and has had to hold his Coreg and Amlodipine on occasion.   According to his wife, while he was on HD at home, he had an episode where he became unresponsive and slumped over. His wife shook him and he opened his eyes but they appeared glazed over and she called 911. This episode lasted about 2 minutes. He was transported to [facility] ER, where he had another episode of unresponsiveness with staring and was noted to have a heart rate drop into the 30's transiently. He was noted to have an elevated troponin, worsening creatinine, Hbg of 8 and markedly elevated blood sugar at 688. Chest x-ray showed mild volume overload. Troponin was elevated at 1.31 and EKG demonstrated NSR with ST depression in I, aVL, V4-V6 which is new and minimal ST elevation in III. The patient was ruled in for a NSTEMI and subsequently transferred to [facility] for cardiology evaluation and treatment. He was started on Heparin and underwent cardiac catheterization on 10/10 which revealed severe 3 vessel CAD with severe ostial circumflex, hazy moderate to severe proximal LAD stenosis, severe proximal RCA stenosis, moderately severe LV systolic dysfunction and an LAD filling defect suggestive of thrombus. He was felt to be a poor candidate for further percutaneous intervention, therefore a cardiac surgery consult was requested for consideration of CABG.  He was evaluated by Dr. Prescott Gum who was in agreement with Coronary Bypass Grafting procedure being the treatment of choice.  The risks and benefits of the procedure were explained to the patient and he was agreeable to proceed.  Hospital Course:   [name] was taken to the operating room on 05/30/2015.  He underwent CABG x 3 utilizing LIMA to LAD, SVG to Left Circumflex, and SVG to PDA.  He also  underwent Endoscopic harvest of the greater saphenous vein from the right thigh.  He tolerated the procedure well and was taken to the SICU in stable condition.  The patient was extubated the evening of surgery.  During his stay in the SICU the patient was weaned off Milrinone as tolerated.  He was followed closely by Nephrology and started on dialysis on POD #2.  The patient had issues with hypoglycemia.  He is a diabetic but due to poor oral intake his insulin was discontinued.  His chest tubes and arterial lines were removed without difficulty.  He was felt medically stable for transfer to the step down unit on POD #3.  The patient continues to make progress.  He did go into ventricular bigeminy on 10/21. Per discussion with Dr. Prescott Gum, he was given Lidocaine 50 mg IV via central line and Amiodarone orally.  He was continued on Coreg 6.25 mg bid. He is maintaining NSR and his pacing wires have been removed.  His LFTs and alk phos began to increase (pre op LFTs back on 05/27/2015 were elevated as well). As a result, Amiodarone and Crestor were discontinued. His last LFTs and alk phos were elevated but decreased from prior. His blood sugars have been creeping up and he has been restarted on insulin.  The patient has a long standing history of inflammatory demyelination polyneuropathy.  He is mostly bed bound, but is able to get up to use the bathroom.  Unfortunately, he was not a CIR candidate.  Per Dr. Prescott Gum, he is  surgically stable for discharge home with home health, PT.   Consults: nephrology  Significant Diagnostic Studies: angiography:    Prox RCA-1 lesion, 80% stenosed.  Prox RCA-2 lesion, 90% stenosed.  Ost Cx to Prox Cx lesion, 99% stenosed.  Prox Cx to Mid Cx lesion, 25% stenosed. The lesion was previously treated with a stent (unknown type).  Ost LAD to Prox LAD lesion, 70% stenosed.  1st Diag lesion, 25% stenosed.  Prox LAD to Mid LAD lesion, 50% stenosed.  Ost 2nd Diag  lesion, 90% stenosed.  There is moderate to severe left ventricular systolic dysfunction.  Treatments: surgery:   Coronary artery bypass grafting x3 (left internal mammary artery to left anterior descending, saphenous vein graft to circumflex marginal, saphenous vein graft to posterior descending). 2. Endoscopic harvest of right leg greater saphenous vein.  Disposition: 02-Short Term Hospital (CIR)  Discharge medications:   Medication List    STOP taking these medications        ALEVE 220 MG tablet  Generic drug:  naproxen sodium     amLODipine 10 MG tablet  Commonly known as:  NORVASC     fluconazole 150 MG tablet  Commonly known as:  DIFLUCAN     insulin regular 100 units/mL injection  Commonly known as:  NOVOLIN R,HUMULIN R     LEVEMIR FLEXTOUCH 100 UNIT/ML Pen  Generic drug:  Insulin Detemir  Replaced by:  insulin detemir 100 UNIT/ML injection     nitroGLYCERIN 0.4 MG SL tablet  Commonly known as:  NITROSTAT     NOVOLOG FLEXPEN 100 UNIT/ML FlexPen  Generic drug:  insulin aspart  Replaced by:  insulin aspart 100 UNIT/ML injection     rosuvastatin 40 MG tablet  Commonly known as:  CRESTOR      TAKE these medications        aspirin 325 MG tablet  Take 325 mg by mouth at bedtime.     calcitRIOL 0.25 MCG capsule  Commonly known as:  ROCALTROL  Take 0.5 mcg by mouth at bedtime.     calcium acetate 667 MG capsule  Commonly known as:  PHOSLO  Take 1 capsule (667 mg total) by mouth 3 (three) times daily with meals.     carvedilol 6.25 MG tablet  Commonly known as:  COREG  Take 1 tablet (6.25 mg total) by mouth 2 (two) times daily with a meal.     chlorhexidine 0.12 % solution  Commonly known as:  PERIDEX  Use as directed 15 mLs in the mouth or throat 2 (two) times daily.     cinacalcet 30 MG tablet  Commonly known as:  SENSIPAR  Take 30 mg by mouth at bedtime.     Darbepoetin Alfa 200 MCG/0.4ML Sosy injection  Commonly known as:  ARANESP  Inject 0.4  mLs (200 mcg total) into the vein every Tuesday with hemodialysis.     docusate sodium 100 MG capsule  Commonly known as:  COLACE  Take 200 mg by mouth at bedtime.     escitalopram 20 MG tablet  Commonly known as:  LEXAPRO  TAKE 1 TABLET BY MOUTH EVERY DAY     fentaNYL 100 MCG/HR  Commonly known as:  DURAGESIC - dosed mcg/hr  Place 100 mcg onto the skin every 3 (three) days.     insulin aspart 100 UNIT/ML injection  Commonly known as:  novoLOG  Inject 0-9 Units into the skin 3 (three) times daily with meals.     insulin detemir 100 UNIT/ML injection  Commonly known as:  LEVEMIR  Inject 0.1 mLs (10 Units total) into the skin 2 (two) times daily.     lidocaine (PF) 1 % Soln injection  Commonly known as:  XYLOCAINE  Inject 5 mLs into the skin as needed (topical anesthesia for hemodialysis ifGEBAUERS is ineffective.).     lidocaine-prilocaine cream  Commonly known as:  EMLA  Apply 2.5 application topically 3 (three) times a week. Apply to dialysis site one hour before treatment     LIQUID TEARS OP  Place 1 drop into both eyes 2 (two) times daily as needed (dry eyes post procedure (every 2 months)).     LORazepam 0.5 MG tablet  Commonly known as:  ATIVAN  Take 0.5 mg by mouth 3 (three) times daily as needed for anxiety.     multivitamin Tabs tablet  Take 1 tablet by mouth at bedtime.     pentafluoroprop-tetrafluoroeth Aero  Commonly known as:  GEBAUERS  Apply 1 application topically as needed (topical anesthesia for hemodialysis).     polyethylene glycol packet  Commonly known as:  MIRALAX / GLYCOLAX  Take 8.5-17 g by mouth at bedtime.     tamsulosin 0.4 MG Caps capsule  Commonly known as:  FLOMAX  TAKE 1 CAPSULE BY MOUTH EVERY DAY       The patient has been discharged on:   1.Beta Blocker:  Yes [ x  ]                              No   [   ]                              If No, reason:  2.Ace Inhibitor/ARB: Yes [   ]                                     No  [   x  ]                                     If No, reason: ESRD  3.Statin:   Yes [   ]                  No  [x   ]                  If No, reason:Elevated LFTs  4.Ecasa:  Yes  [ x  ]                  No   [   ]                  If No, reason:  Discharge Instructions    Amb Referral to Cardiac Rehabilitation    Complete by:  As directed   Diagnosis:   Myocardial Infarction CABG             Follow-up Information    Follow up with Ivin Poot III, MD On 07/02/2015.   Specialty:  Cardiothoracic Surgery   Why:  Appointment is at 1:00   Contact information:   [address] [address] [phone]       Follow up  with [facility] On 07/02/2015.   Why:  Please get CXR at 12:30   Contact information:   [facility]       Follow up with Wilhemena Durie, MD.   Specialty:  Family Medicine   Why:  Call for a follow up appointment regarding further surveillance of HGA1C 9.3 and further diabetes management   Contact information:   [address] [place] [address] [phone]       Follow up with Christell Faith, PA-C On 06/19/2015.   Specialties:  Physician Assistant, Cardiology, Radiology   Why:  _0 :30 for cardiology f/u   Contact information:   [place] [address] [phone]       Follow up with [facility].   Specialty:  Home Health Services   Why:  HH-RN/PT arranged [phone])   Contact information:   [place] [address] [phone]       Follow up with [facility].   Why:  3n1 arranged- to be delivered to room prior to discharge   Contact information:   [place] [address] [phone]       Signed: Nani Skillern PA-C 06/09/2015, 2:21 PM

## 2015-06-03 NOTE — Progress Notes (Signed)
Inpatient Diabetes Program Recommendations  AACE/ADA: New Consensus Statement on Inpatient Glycemic Control (2015)  Target Ranges:  Prepandial:   less than 140 mg/dL      Peak postprandial:   less than 180 mg/dL (1-2 hours)      Critically ill patients:  140 - 180 mg/dL   Review of Glycemic control  Results for Kizzie FurnishJAMES, Arnaldo M (MRN [id number]) as of 06/03/2015 09:24  Ref. Range 06/02/2015 10:07 06/02/2015 12:18 06/02/2015 16:45 06/02/2015 21:56 06/03/2015 06:08  Glucose-Capillary Latest Ref Range: 65-99 mg/dL 914100 (H) 782132 (H) 956199 (H) 187 (H) 159 (H)    Outpatient Diabetes medications: Levemir 42 units q HS, Novolog 12-14 units with lunch  Current orders for Inpatient glycemic control:  Novolog sensitive 0-9 units tid with meals  Inpatient Diabetes Program Recommendations:  Patient will likely require basal insulin again as blood sugars number are beginning to increase. Consider Lantus 5 units qhs.  Susette RacerJulie Aime Meloche, RN, BA, MHA, CDE Diabetes Coordinator Inpatient Diabetes Program  [phone] (Team Pager) [phone] [facility] Office) 06/03/2015 9:27 AM

## 2015-06-03 NOTE — Discharge Instructions (Signed)
Coronary Artery Bypass Grafting, Care After °Refer to this sheet in the next few weeks. These instructions provide you with information on caring for yourself after your procedure. Your health care provider may also give you more specific instructions. Your treatment has been planned according to current medical practices, but problems sometimes occur. Call your health care provider if you have any problems or questions after your procedure. °WHAT TO EXPECT AFTER THE PROCEDURE °Recovery from surgery will be different for everyone. Some people feel well after 3 or 4 weeks, while for others it takes longer. After your procedure, it is typical to have the following: °· Nausea and a lack of appetite.   °· Constipation. °· Weakness and fatigue.   °· Depression or irritability.   °· Pain or discomfort at your incision site. °HOME CARE INSTRUCTIONS °· Take medicines only as directed by your health care provider. Do not stop taking medicines or start any new medicines without first checking with your health care provider. °· Take your pulse as directed by your health care provider. °· Perform deep breathing as directed by your health care provider. If you were given a device called an incentive spirometer, use it to practice deep breathing several times a day. Support your chest with a pillow or your arms when you take deep breaths or cough. °· Keep incision areas clean, dry, and protected. Remove or change any bandages (dressings) only as directed by your health care provider. You may have skin adhesive strips over the incision areas. Do not take the strips off. They will fall off on their own. °· Check incision areas daily for any swelling, redness, or drainage. °· If incisions were made in your legs, do the following: °¨ Avoid crossing your legs.   °¨ Avoid sitting for long periods of time. Change positions every 30 minutes.   °¨ Elevate your legs when you are sitting. °· Wear compression stockings as directed by your  health care provider. These stockings help keep blood clots from forming in your legs. °· Take showers once your health care provider approves. Until then, only take sponge baths. Pat incisions dry. Do not rub incisions with a washcloth or towel. Do not take baths, swim, or use a hot tub until your health care provider approves. °· Eat foods that are high in fiber, such as raw fruits and vegetables, whole grains, beans, and nuts. Meats should be lean cut. Avoid canned, processed, and fried foods. °· Drink enough fluid to keep your urine clear or pale yellow. °· Weigh yourself every day. This helps identify if you are retaining fluid that may make your heart and lungs work harder. °· Rest and limit activity as directed by your health care provider. You may be instructed to: °¨ Stop any activity at once if you have chest pain, shortness of breath, irregular heartbeats, or dizziness. Get help right away if you have any of these symptoms. °¨ Move around frequently for short periods or take short walks as directed by your health care provider. Increase your activities gradually. You may need physical therapy or cardiac rehabilitation to help strengthen your muscles and build your endurance. °¨ Avoid lifting, pushing, or pulling anything heavier than 10 lb (4.5 kg) for at least 6 weeks after surgery. °· Do not drive until your health care provider approves.  °· Ask your health care provider when you may return to work. °· Ask your health care provider when you may resume sexual activity. °· Keep all follow-up visits as directed by your health care   provider. This is important. °SEEK MEDICAL CARE IF: °· You have swelling, redness, increasing pain, or drainage at the site of an incision. °· You have a fever. °· You have swelling in your ankles or legs. °· You have pain in your legs.   °· You gain 2 or more pounds (0.9 kg) a day. °· You are nauseous or vomit. °· You have diarrhea.  °SEEK IMMEDIATE MEDICAL CARE IF: °· You have  chest pain that goes to your jaw or arms. °· You have shortness of breath.   °· You have a fast or irregular heartbeat.   °· You notice a "clicking" in your breastbone (sternum) when you move.   °· You have numbness or weakness in your arms or legs. °· You feel dizzy or light-headed.   °MAKE SURE YOU: °· Understand these instructions. °· Will watch your condition. °· Will get help right away if you are not doing well or get worse. °  °This information is not intended to replace advice given to you by your health care provider. Make sure you discuss any questions you have with your health care provider. °  °Document Released: 02/19/2005 Document Revised: 08/23/2014 Document Reviewed: 01/09/2013 °Elsevier Interactive Patient Education ©2016 [employer] ° °Endoscopic Saphenous Vein Harvesting, Care After °Refer to this sheet in the next few weeks. These instructions provide you with information on caring for yourself after your procedure. Your health care provider may also give you more specific instructions. Your treatment has been planned according to current medical practices, but problems sometimes occur. Call your health care provider if you have any problems or questions after your procedure. °HOME CARE INSTRUCTIONS °Medicine °· Take whatever pain medicine your surgeon prescribes. Follow the directions carefully. Do not take over-the-counter pain medicine unless your surgeon says it is okay. Some pain medicine can cause bleeding problems for several weeks after surgery. °· Follow your surgeon's instructions about driving. You will probably not be permitted to drive after heart surgery. °· Take any medicines your surgeon prescribes. Any medicines you took before your heart surgery should be checked with your health care provider before you start taking them again. °Wound care °· If your surgeon has prescribed an elastic bandage or stocking, ask how long you should wear it. °· Check the area around your surgical  cuts (incisions) whenever your bandages (dressings) are changed. Look for any redness or swelling. °· You will need to return to have the stitches (sutures) or staples taken out. Ask your surgeon when to do that. °· Ask your surgeon when you can shower or bathe. °Activity °· Try to keep your legs raised when you are sitting. °· Do any exercises your health care providers have given you. These may include deep breathing exercises, coughing, walking, or other exercises. °SEEK MEDICAL CARE IF: °· You have any questions about your medicines. °· You have more leg pain, especially if your pain medicine stops working. °· New or growing bruises develop on your leg. °· Your leg swells, feels tight, or becomes red. °· You have numbness in your leg. °SEEK IMMEDIATE MEDICAL CARE IF: °· Your pain gets much worse. °· Blood or fluid leaks from any of the incisions. °· Your incisions become warm, swollen, or red. °· You have chest pain. °· You have trouble breathing. °· You have a fever. °· You have more pain near your leg incision. °MAKE SURE YOU: °· Understand these instructions. °· Will watch your condition. °· Will get help right away if you are not doing well or   get worse. °  °This information is not intended to replace advice given to you by your health care provider. Make sure you discuss any questions you have with your health care provider. °  °Document Released: 04/14/2011 Document Revised: 08/23/2014 Document Reviewed: 04/14/2011 °Elsevier Interactive Patient Education ©2016 Elsevier Inc. ° ° °

## 2015-06-03 NOTE — Progress Notes (Signed)
Pt working with PT, only able to walk 8 ft yesterday. Will not follow for ambulation. Discussed continued IS use. Pts IS got left on 2S so I got him another one. Discussed the possibility of doing CRPII down the road. [facility] Will sign off. [phone] Ethelda ChickKristan Estelle Greenleaf CES, ACSM 10:04 AM 06/03/2015

## 2015-06-03 NOTE — Progress Notes (Signed)
Physical Therapy Treatment Patient Details Name: Daniel Anthony MRN: [id number] DOB: [birth year] Today's Date: 06/03/2015    History of Present Illness pt is a 64 year old Caucasian male with history of coronary artery disease and prior stents, DM, CIPD, foot drop, ESRD  . Transferred from outside hospital for non-ST elevation MI.  Cath shows 3 vessel ds.  s/p CABG 10/14.    PT Comments    Patient progressing well towards PT goals. Continues to have some difficulty standing from low surfaces due to weakness in BLEs and inability to use UEs. Reviewed sternal precautions. Improved ambulation distance today with use of RW for support. Pt with decreased abdominal strength with instances of posterior lean in seated/standing requiring assist for support. Motivated. Wife plans to bring AFOs and shoes for tomorrow. Will follow acutely.   Follow Up Recommendations  CIR     Equipment Recommendations  None recommended by PT    Recommendations for Other Services       Precautions / Restrictions Precautions Precautions: Fall;Sternal Restrictions Weight Bearing Restrictions: No Other Position/Activity Restrictions: Has Bil AFOs- to bring from home    Mobility  Bed Mobility Overal bed mobility: Needs Assistance Bed Mobility: Supine to Sit     Supine to sit: Mod assist;HOB elevated Sit to supine: Min assist;HOB elevated   General bed mobility comments: Cues to refrain from using UEs during transfers. Some difficulty with reciprocal scooting with LOB posteriorly. HOB remained elevated as pt sleeps in recliner at home. Able to help scoot self up in bed with cues and LEs.  Transfers Overall transfer level: Needs assistance Equipment used: None Transfers: Sit to/from Stand Sit to Stand: Max assist;+2 physical assistance         General transfer comment: Cues for use of body momentum and anterior translation to boost up from chair. Manual assist for foot placement 2/2 to tight heel  cords. Stood from [employer]. No use of arms. Leans posteriorly in standing.  Ambulation/Gait Ambulation/Gait assistance: Mod assist Ambulation Distance (Feet): 25 Feet Assistive device: Rolling walker (2 wheeled) Gait Pattern/deviations: Step-through pattern;Decreased dorsiflexion - right;Decreased dorsiflexion - left   Gait velocity interpretation: Below normal speed for age/gender General Gait Details: Slow, unsteady gait with posterior lean. Decreased foot clearance bil with knee recurvatum in stance. Dyspnea present. Vitals stable. Did not have chance to bring AFOs yet.   Stairs            Wheelchair Mobility    Modified Rankin (Stroke Patients Only)       Balance Overall balance assessment: Needs assistance Sitting-balance support: Feet supported;No upper extremity supported Sitting balance-Leahy Scale: Poor Sitting balance - Comments: Initially requiring Mod A for sitting balance due to posterior lean however able to progress to Min guard assist. Min A for reciprocal scooting to EOB. Postural control: Posterior lean Standing balance support: During functional activity Standing balance-Leahy Scale: Poor Standing balance comment: Relient on RW for support and Min A for balance due to retropulsion.                    Cognition Arousal/Alertness: Awake/alert Behavior During Therapy: WFL for tasks assessed/performed Overall Cognitive Status: Within Functional Limits for tasks assessed       Memory: Decreased short-term memory              Exercises      General Comments General comments (skin integrity, edema, etc.): Spouse present during session.      Pertinent Vitals/Pain Pain Assessment: Faces Faces  Pain Scale: Hurts a little bit Pain Location: back with ambulation Pain Descriptors / Indicators: Sore Pain Intervention(s): Monitored during session;Repositioned    Home Living                      Prior Function            PT Goals  (current goals can now be found in the care plan section) Progress towards PT goals: Progressing toward goals    Frequency  Min 3X/week    PT Plan Current plan remains appropriate    Co-evaluation             End of Session Equipment Utilized During Treatment: Gait belt Activity Tolerance: Patient limited by fatigue;Patient tolerated treatment well Patient left: in bed;with call bell/phone within reach;with bed alarm set;with family/visitor present     Time: 1345-1410 PT Time Calculation (min) (ACUTE ONLY): 25 min  Charges:  $Gait Training: 8-22 mins $Therapeutic Activity: 8-22 mins                    G Codes:      Zyquan Crotty A Moshe Wenger 06/03/2015, 3:06 PM  Mylo RedShauna Huriel Matt, PT, DPT [phone]

## 2015-06-03 NOTE — Progress Notes (Addendum)
      [address]       [place] [id number]             [phone]        4 Days Post-Op Procedure(s) (LRB):  CORONARY ARTERY BYPASS GRAFTING (CABG) x 3 (LIMA to LAD, SVG to CIRCUMFLEX, and SVG to PDA) with EVH from right thigh greater saphenous vein and left internal mammary artery harvest (N/A) TRANSESOPHAGEAL ECHOCARDIOGRAM (TEE) (N/A)  Subjective: Patient a little fatigued, but otherwise no complaints.  Objective: Vital signs in last 24 hours: Temp:  [97.9 F (36.6 C)-98.7 F (37.1 C)] 98.6 F (37 C) (10/18 0517) Pulse Rate:  [43-88] 88 (10/18 0517) Cardiac Rhythm:  [-] Normal sinus rhythm (10/18 0115) Resp:  [12-24] 14 (10/18 0517) BP: (90-154)/(37-99) 123/68 mmHg (10/18 0517) SpO2:  [91 %-99 %] 98 % (10/18 0517) Weight:  [184 lb 3.2 oz (83.553 kg)-189 lb 13.1 oz (86.1 kg)] 184 lb 3.2 oz (83.553 kg) (10/18 0116)  Pre op weight 80 kg Current Weight  06/03/15 184 lb 3.2 oz (83.553 kg)      Intake/Output from previous day: 10/17 0701 - 10/18 0700 In: 153 [P.O.:120; I.V.:33] Out: 2500    Physical Exam:  Cardiovascular: RRR Pulmonary: Mostly clear to auscultation bilaterally; no rales, wheezes, or rhonchi. Abdomen: Soft, non tender, bowel sounds present. Extremities: Mild bilateral lower extremity edema. Wounds: Clean and dry.  No erythema or signs of infection.  Lab Results: CBC: Recent Labs  06/01/15 0451 06/02/15 0600  WBC 11.6* 9.8  HGB 10.6* 10.2*  HCT 32.2* 30.6*  PLT 135* 113*   BMET:  Recent Labs  06/01/15 0451 06/02/15 0600  NA 127* 132*  K 4.5 3.5  CL 90* 94*  CO2 21* 27  GLUCOSE 78 54*  BUN 35* 19  CREATININE 5.58* 3.70*  CALCIUM 8.6* 7.8*    PT/INR:  Lab Results  Component Value Date   INR 1.30 05/30/2015   INR 1.41 05/26/2015   ABG:  INR: Will add last result for INR, ABG once components are confirmed Will add last 4 CBG results once components are confirmed  Assessment/Plan:  1. CV - S/p NSTEMI. SR  in the 80's. On Coreg 3.125 mg bid 2.  Pulmonary - On room air. Encourage incentive spirometer 3. ESRD-HD daily while in hospital. Per nephrology 4.  Acute blood loss anemia - Last H and H stable at 10.2 and 30.6. On Aranesp 5. Mild thrombocytopenia-last platelet count was 113,000 6. Remove EPW 7. Await PT evaluation today. Patient with history of CIDP. 8.DM-CBGs 199/187/159. On Insulin. Pre op HGA1C 9.3. Will need close follow up with medical doctor after discharge. 9. Hopefully, home by end of week  ZIMMERMAN,DONIELLE MPA-C 06/03/2015,8:01 AM  Patient with very poor mobility secondary to chronic inflammatory demyelinating polyneuropathy--C IR consult placed  Postop cardiac status remains stable Chest x-ray personally reviewed and shows minimal pleural effusion  patient examined and medical record reviewed,agree with above note. Kathlee Nationseter Van Trigt III 06/03/2015

## 2015-06-03 NOTE — Telephone Encounter (Signed)
lmov for pt so we can schedule CABG X 3 SURG that was done on 05/30/15 Pt needs 2-3w fu with Alycia Rossettiyan or Thayer Ohmhris

## 2015-06-03 NOTE — Progress Notes (Signed)
[facility] Progress Note   Subjective: 2.5kg off w HD yesterday.   Filed Vitals:   06/02/15 2300 06/03/15 0000 06/03/15 0116 06/03/15 0517  BP: 104/56 102/56 149/80 123/68  Pulse: 77 75 43 88  Temp:   98.5 F (36.9 C) 98.6 F (37 C)  TempSrc:   Oral Oral  Resp: 13 12 13 14   Height:   5\' 9"  (1.753 m)   Weight:   83.553 kg (184 lb 3.2 oz)   SpO2: 96% 94% 96% 98%   Exam: Alert, no distress Chest clear ant / lat RRR no rub Abd soft / ntnd 1+ LE edema Neuro alert nf LFA AVF +bruit   Home HD / K Machine - MWF 4h  79kg  2/2.25 bath  LUE AVF   Calc 0.25 ug daily Mircera yes?  Last P 6.2, pth 549, tsat 53, ferr 935      Assessment: 1 ESRD on home HD, cont MWF here 2 NSTEMI - s/p CABG 10/14, stable 3 Vol excess up 4 kg by wts now, improving 4 Anemia cont esa 5 MBD cont meds 6 HTN good bp control  Plan - HD today and tomorrow, should be close to dry wt then   Vinson Moselleob Alianny Toelle MD [facility] pager [phone]    cell [phone] 06/03/2015, 8:30 AM    Recent Labs Lab 05/29/15 0759  06/01/15 0451 06/02/15 0600 06/03/15 0745  NA 144  < > 127* 132* 131*  K 3.1*  < > 4.5 3.5 4.3  CL 101  < > 90* 94* 91*  CO2 27  < > 21* 27 28  GLUCOSE 79  < > 78 54* 217*  BUN 19  < > 35* 19 19  CREATININE 3.45*  < > 5.58* 3.70* 3.92*  CALCIUM 9.0  < > 8.6* 7.8* 8.4*  PHOS 3.0  --   --  4.6  --   < > = values in this interval not displayed.  Recent Labs Lab 05/28/15 1016 05/29/15 0759 06/02/15 0600 06/03/15 0745  AST 69*  --   --  54*  ALT 124*  --   --  57  ALKPHOS 276*  --   --  443*  BILITOT 1.9*  --   --  1.8*  PROT 6.2*  --   --  5.7*  ALBUMIN 2.3* 2.1* 1.8* 1.9*    Recent Labs Lab 06/01/15 0451 06/02/15 0600 06/03/15 0745  WBC 11.6* 9.8 9.0  HGB 10.6* 10.2* 10.8*  HCT 32.2* 30.6* 33.2*  MCV 91.2 90.8 95.1  PLT 135* 113* 152   . acetaminophen  1,000 mg Oral 4 times per day   Or  . acetaminophen (TYLENOL) oral liquid 160 mg/5  mL  1,000 mg Per Tube 4 times per day  . aspirin EC  325 mg Oral Daily   Or  . aspirin  324 mg Per Tube Daily  . bisacodyl  10 mg Oral Daily   Or  . bisacodyl  10 mg Rectal Daily  . calcitRIOL  0.25 mcg Oral BID  . calcium acetate  667 mg Oral TID WC  . carvedilol  3.125 mg Oral BID WC  . cinacalcet  30 mg Oral QHS  . darbepoetin (ARANESP) injection - DIALYSIS  200 mcg Intravenous Q Tue-HD  . docusate sodium  200 mg Oral Daily  . escitalopram  20 mg Oral QHS  . fentaNYL  100 mcg Transdermal Q72H  . insulin aspart  0-9 Units Subcutaneous TID WC  .  multivitamin  1 tablet Oral QHS  . pantoprazole  40 mg Oral Daily  . polyethylene glycol  8.5-17 g Oral QHS  . rosuvastatin  40 mg Oral QHS  . sodium chloride  3 mL Intravenous Q12H  . sodium chloride  3 mL Intravenous Q12H  . tamsulosin  0.4 mg Oral QHS   . sodium chloride Stopped (05/31/15 1430)  . sodium chloride Stopped (06/02/15 1000)   sodium chloride, sodium chloride, HYDROmorphone, Influenza vac split quadrivalent PF, LORazepam, menthol-cetylpyridinium, metoprolol, ondansetron (ZOFRAN) IV, phenol, sodium chloride, sodium chloride, sodium chloride

## 2015-06-04 LAB — CBC
HCT: 31.8 % — ABNORMAL LOW (ref 39.0–52.0)
Hemoglobin: 10.1 g/dL — ABNORMAL LOW (ref 13.0–17.0)
MCH: 30.1 pg (ref 26.0–34.0)
MCHC: 31.8 g/dL (ref 30.0–36.0)
MCV: 94.9 fL (ref 78.0–100.0)
Platelets: 153 10*3/uL (ref 150–400)
RBC: 3.35 MIL/uL — ABNORMAL LOW (ref 4.22–5.81)
RDW: 17.3 % — ABNORMAL HIGH (ref 11.5–15.5)
WBC: 7.3 10*3/uL (ref 4.0–10.5)

## 2015-06-04 LAB — GLUCOSE, CAPILLARY
Glucose-Capillary: 218 mg/dL — ABNORMAL HIGH (ref 65–99)
Glucose-Capillary: 240 mg/dL — ABNORMAL HIGH (ref 65–99)
Glucose-Capillary: 303 mg/dL — ABNORMAL HIGH (ref 65–99)

## 2015-06-04 LAB — BASIC METABOLIC PANEL
Anion gap: 13 (ref 5–15)
BUN: 18 mg/dL (ref 6–20)
CO2: 28 mmol/L (ref 22–32)
Calcium: 8.5 mg/dL — ABNORMAL LOW (ref 8.9–10.3)
Chloride: 94 mmol/L — ABNORMAL LOW (ref 101–111)
Creatinine, Ser: 3.46 mg/dL — ABNORMAL HIGH (ref 0.61–1.24)
GFR calc Af Amer: 20 mL/min — ABNORMAL LOW (ref >60)
GFR calc non Af Amer: 17 mL/min — ABNORMAL LOW (ref >60)
Glucose, Bld: 255 mg/dL — ABNORMAL HIGH (ref 65–99)
Potassium: 3.6 mmol/L (ref 3.5–5.1)
Sodium: 135 mmol/L (ref 135–145)

## 2015-06-04 MED ORDER — CALCIUM ACETATE 667 MG PO CAPS: 667.0000 mg | ORAL_CAPSULE | Freq: Three times a day (TID) | ORAL | Status: DC

## 2015-06-04 MED ORDER — CALCITRIOL 0.25 MCG PO CAPS
ORAL_CAPSULE | ORAL | Status: AC
  Administered 2015-06-04: 0.25 ug via ORAL
  Filled 2015-06-04: qty 1

## 2015-06-04 MED ORDER — FENTANYL 100 MCG/HR TD PT72
100.0000 ug | MEDICATED_PATCH | TRANSDERMAL | Status: DC
  Administered 2015-06-04 – 2015-06-07 (×2): 100 ug via TRANSDERMAL
  Filled 2015-06-04 (×2): qty 1

## 2015-06-04 MED ORDER — LIDOCAINE HCL (PF) 1 % IJ SOLN: 5.0000 mL | INTRAMUSCULAR | Status: DC | PRN

## 2015-06-04 MED ORDER — INSULIN ASPART 100 UNIT/ML ~~LOC~~ SOLN: 0.0000 [IU] | Freq: Three times a day (TID) | SUBCUTANEOUS | Status: AC

## 2015-06-04 MED ORDER — CARVEDILOL 3.125 MG PO TABS: 3.1250 mg | ORAL_TABLET | Freq: Two times a day (BID) | ORAL | Status: DC

## 2015-06-04 MED ORDER — ROSUVASTATIN CALCIUM 40 MG PO TABS: 40.0000 mg | ORAL_TABLET | Freq: Every day | ORAL | Status: DC

## 2015-06-04 MED ORDER — LORAZEPAM 0.5 MG PO TABS
ORAL_TABLET | ORAL | Status: AC
  Filled 2015-06-04: qty 1

## 2015-06-04 MED ORDER — CARVEDILOL 3.125 MG PO TABS: 25.0000 mg | ORAL_TABLET | Freq: Two times a day (BID) | ORAL | Status: DC

## 2015-06-04 MED ORDER — INSULIN DETEMIR 100 UNIT/ML ~~LOC~~ SOLN: 10.0000 [IU] | Freq: Every day | SUBCUTANEOUS | Status: DC

## 2015-06-04 MED ORDER — INSULIN DETEMIR 100 UNIT/ML ~~LOC~~ SOLN
10.0000 [IU] | Freq: Every day | SUBCUTANEOUS | Status: DC
  Administered 2015-06-04: 10 [IU] via SUBCUTANEOUS
  Filled 2015-06-04: qty 0.1

## 2015-06-04 MED ORDER — PENTAFLUOROPROP-TETRAFLUOROETH EX AERO: 1.0000 "application " | INHALATION_SPRAY | CUTANEOUS | Status: DC | PRN

## 2015-06-04 MED ORDER — DARBEPOETIN ALFA 200 MCG/0.4ML IJ SOSY: 200.0000 ug | PREFILLED_SYRINGE | INTRAMUSCULAR | Status: AC

## 2015-06-04 NOTE — Progress Notes (Addendum)
      [address]       [place] [id number]             [phone]        5 Days Post-Op Procedure(s) (LRB):  CORONARY ARTERY BYPASS GRAFTING (CABG) x 3 (LIMA to LAD, SVG to CIRCUMFLEX, and SVG to PDA) with EVH from right thigh greater saphenous vein and left internal mammary artery harvest (N/A) TRANSESOPHAGEAL ECHOCARDIOGRAM (TEE) (N/A)  Subjective: Patient states has HD today. No specific complaints.  Objective: Vital signs in last 24 hours: Temp:  [97.9 F (36.6 C)-98.4 F (36.9 C)] 98.4 F (36.9 C) (10/19 0442) Pulse Rate:  [73-81] 74 (10/19 0442) Cardiac Rhythm:  [-] Normal sinus rhythm (10/19 0743) Resp:  [14-20] 14 (10/19 0442) BP: (91-143)/(46-76) 113/59 mmHg (10/19 0442) SpO2:  [86 %-100 %] 96 % (10/19 0442) Weight:  [168 lb 3.4 oz (76.3 kg)-196 lb 6.9 oz (89.1 kg)] 168 lb 3.4 oz (76.3 kg) (10/19 0442)  Pre op weight 80 kg Current Weight  06/04/15 168 lb 3.4 oz (76.3 kg)      Intake/Output from previous day: 10/18 0701 - 10/19 0700 In: 240 [P.O.:240] Out: -    Physical Exam:  Cardiovascular: RRR Pulmonary: Mostly clear to auscultation bilaterally; no rales, wheezes, or rhonchi. Abdomen: Soft, non tender, bowel sounds present. Extremities: Trace bilateral lower extremity edema. Wounds: Clean and dry.  No erythema or signs of infection.  Lab Results: CBC:  Recent Labs  06/03/15 0745 06/04/15 0505  WBC 9.0 7.3  HGB 10.8* 10.1*  HCT 33.2* 31.8*  PLT 152 153   BMET:   Recent Labs  06/03/15 0745 06/04/15 0505  NA 131* 135  K 4.3 3.6  CL 91* 94*  CO2 28 28  GLUCOSE 217* 255*  BUN 19 18  CREATININE 3.92* 3.46*  CALCIUM 8.4* 8.5*    PT/INR:  Lab Results  Component Value Date   INR 1.30 05/30/2015   INR 1.41 05/26/2015   ABG:  INR: Will add last result for INR, ABG once components are confirmed Will add last 4 CBG results once components are confirmed  Assessment/Plan:  1. CV - S/p NSTEMI. SR in the 70's. On  Coreg 3.125 mg bid 2.  Pulmonary - On room air. Encourage incentive spirometer 3. ESRD-HD daily while in hospital. Per nephrology 4.  Acute blood loss anemia - Last H and H stable at 10.8 and 31.8. On Aranesp 5. Mild thrombocytopenia resloved-platelet count up to 153,000 6. Remove central line 7.DM-CBGs 215/230/218. On Insulin but will give scheduled at bedtime. Pre op HGA1C 9.3. Will need close follow up with medical doctor after discharge. 8. Surgically stable for discharge to CIR if bed available (per Dr. Donata ClayVan Trigt)  ZIMMERMAN,DONIELLE MPA-C 06/04/2015,7:59 AM

## 2015-06-04 NOTE — Progress Notes (Signed)
[facility] Progress Note   Subjective: no complaints, on HD  Filed Vitals:   06/04/15 0939 06/04/15 1000 06/04/15 1030 06/04/15 1100  BP: 90/52 108/59 96/59 97/61   Pulse: 71 75 70 69  Temp:      TempSrc:      Resp:      Height:      Weight:      SpO2:       Exam: Alert, no distress Chest clear ant / lat RRR no rub Abd soft / ntnd 1+ LE edema Neuro alert nf LFA AVF +bruit   Home HD / K Machine - MWF 4h  79kg  2/2.25 bath  LUE AVF   Calc 0.25 ug daily Mircera yes?  Last P 6.2, pth 549, tsat 53, ferr 935      Assessment: 1 ESRD on home HD, cont MWF here 2 NSTEMI - s/p CABG 10/14, stable 3 Vol up 6kg by wts but not sure they are correct 4 Anemia cont esa 5 MBD cont meds 6 HTN good bp control  Plan - HD today, then Friday.    Vinson Moselleob Angelene Rome MD [facility] pager [phone]    cell [phone] 06/04/2015, 11:33 AM    Recent Labs Lab 05/29/15 0759  06/02/15 0600 06/03/15 0745 06/04/15 0505  NA 144  < > 132* 131* 135  K 3.1*  < > 3.5 4.3 3.6  CL 101  < > 94* 91* 94*  CO2 27  < > 27 28 28   GLUCOSE 79  < > 54* 217* 255*  BUN 19  < > 19 19 18   CREATININE 3.45*  < > 3.70* 3.92* 3.46*  CALCIUM 9.0  < > 7.8* 8.4* 8.5*  PHOS 3.0  --  4.6  --   --   < > = values in this interval not displayed.  Recent Labs Lab 05/29/15 0759 06/02/15 0600 06/03/15 0745  AST  --   --  54*  ALT  --   --  57  ALKPHOS  --   --  443*  BILITOT  --   --  1.8*  PROT  --   --  5.7*  ALBUMIN 2.1* 1.8* 1.9*    Recent Labs Lab 06/02/15 0600 06/03/15 0745 06/04/15 0505  WBC 9.8 9.0 7.3  HGB 10.2* 10.8* 10.1*  HCT 30.6* 33.2* 31.8*  MCV 90.8 95.1 94.9  PLT 113* 152 153   . acetaminophen  1,000 mg Oral 4 times per day   Or  . acetaminophen (TYLENOL) oral liquid 160 mg/5 mL  1,000 mg Per Tube 4 times per day  . aspirin EC  325 mg Oral Daily   Or  . aspirin  324 mg Per Tube Daily  . bisacodyl  10 mg Oral Daily   Or  . bisacodyl  10 mg Rectal  Daily  . calcitRIOL  0.25 mcg Oral BID  . calcium acetate  667 mg Oral TID WC  . carvedilol  3.125 mg Oral BID WC  . cinacalcet  30 mg Oral QHS  . darbepoetin (ARANESP) injection - DIALYSIS  200 mcg Intravenous Q Tue-HD  . docusate sodium  200 mg Oral Daily  . escitalopram  20 mg Oral QHS  . fentaNYL  100 mcg Transdermal Q72H  . insulin aspart  0-9 Units Subcutaneous TID WC  . insulin detemir  10 Units Subcutaneous QHS  . LORazepam      . multivitamin  1 tablet Oral QHS  . pantoprazole  40 mg Oral Daily  . polyethylene glycol  8.5-17 g Oral QHS  . rosuvastatin  40 mg Oral QHS  . sodium chloride  3 mL Intravenous Q12H  . tamsulosin  0.4 mg Oral QHS     heparin, HYDROmorphone, Influenza vac split quadrivalent PF, LORazepam, menthol-cetylpyridinium, metoprolol, ondansetron (ZOFRAN) IV, phenol, sodium chloride, sodium chloride

## 2015-06-04 NOTE — Progress Notes (Signed)
Inpatient Rehabilitation  I met with the patient at the bedside in HD to discuss his post acute rehab needs.  Pt. says he is very interested in CIR for his post acute care and recovery.  I have informed pt. that admission to rehab is dependent on medical readiness, insurance approval and bed availability.  He understands and wants me to pursue insurance authorization.  I have initiated the process.  Please call if questions.  [facility] Admissions Coordinator Cell [phone] Office [phone]

## 2015-06-04 NOTE — Progress Notes (Signed)
OT Cancellation Note  Patient Details Name: Daniel FurnishRobert M Anthony MRN: [id number] DOB: [birth year]   Cancelled Treatment:    Reason Eval/Treat Not Completed:  (Pt about to leave for HD.)  Earlie RavelingStraub, Ruel Dimmick L OTR/L [phone] 06/04/2015, 8:27 AM

## 2015-06-05 DIAGNOSIS — I498 Other specified cardiac arrhythmias: Secondary | ICD-10-CM

## 2015-06-05 DIAGNOSIS — I2510 Atherosclerotic heart disease of native coronary artery without angina pectoris: Secondary | ICD-10-CM

## 2015-06-05 DIAGNOSIS — I499 Cardiac arrhythmia, unspecified: Secondary | ICD-10-CM

## 2015-06-05 LAB — GLUCOSE, CAPILLARY
GLUCOSE-CAPILLARY: 224 mg/dL — AB (ref 65–99)
Glucose-Capillary: 218 mg/dL — ABNORMAL HIGH (ref 65–99)
Glucose-Capillary: 248 mg/dL — ABNORMAL HIGH (ref 65–99)
Glucose-Capillary: 189 mg/dL — ABNORMAL HIGH (ref 65–99)

## 2015-06-05 LAB — POTASSIUM: POTASSIUM: 4.5 mmol/L (ref 3.5–5.1)

## 2015-06-05 MED ORDER — INSULIN DETEMIR 100 UNIT/ML ~~LOC~~ SOLN
10.0000 [IU] | Freq: Two times a day (BID) | SUBCUTANEOUS | Status: DC
  Administered 2015-06-05 – 2015-06-09 (×8): 10 [IU] via SUBCUTANEOUS
  Filled 2015-06-05 (×10): qty 0.1

## 2015-06-05 MED ORDER — CHLORHEXIDINE GLUCONATE 0.12 % MT SOLN
15.0000 mL | Freq: Two times a day (BID) | OROMUCOSAL | Status: DC
  Administered 2015-06-05 – 2015-06-07 (×6): 15 mL via OROMUCOSAL
  Filled 2015-06-05 (×8): qty 15

## 2015-06-05 MED ORDER — INSULIN DETEMIR 100 UNIT/ML ~~LOC~~ SOLN: 10.0000 [IU] | Freq: Two times a day (BID) | SUBCUTANEOUS | Status: AC

## 2015-06-05 MED ORDER — CARVEDILOL 6.25 MG PO TABS
6.2500 mg | ORAL_TABLET | Freq: Two times a day (BID) | ORAL | Status: DC
  Administered 2015-06-05 – 2015-06-09 (×7): 6.25 mg via ORAL
  Filled 2015-06-05 (×7): qty 1

## 2015-06-05 MED ORDER — DARBEPOETIN ALFA 200 MCG/0.4ML IJ SOSY: 200.0000 ug | PREFILLED_SYRINGE | INTRAMUSCULAR | Status: DC

## 2015-06-05 MED ORDER — CHLORHEXIDINE GLUCONATE 0.12 % MT SOLN: 15.0000 mL | Freq: Two times a day (BID) | OROMUCOSAL | Status: DC

## 2015-06-05 MED ORDER — AMIODARONE HCL 200 MG PO TABS
200.0000 mg | ORAL_TABLET | Freq: Two times a day (BID) | ORAL | Status: DC
  Administered 2015-06-05 – 2015-06-06 (×4): 200 mg via ORAL
  Filled 2015-06-05 (×4): qty 1

## 2015-06-05 MED ORDER — LIDOCAINE HCL (CARDIAC) 20 MG/ML IV SOLN
50.0000 mg | Freq: Once | INTRAVENOUS | Status: AC
  Administered 2015-06-05: 50 mg via INTRAVENOUS
  Filled 2015-06-05: qty 5

## 2015-06-05 NOTE — Progress Notes (Addendum)
      [address]       [place] [id number]             [phone]        6 Days Post-Op Procedure(s) (LRB):  CORONARY ARTERY BYPASS GRAFTING (CABG) x 3 (LIMA to LAD, SVG to CIRCUMFLEX, and SVG to PDA) with EVH from right thigh greater saphenous vein and left internal mammary artery harvest (N/A) TRANSESOPHAGEAL ECHOCARDIOGRAM (TEE) (N/A)  Subjective: Patient's only complaint is soreness along lateral portion of tongue.  Objective: Vital signs in last 24 hours: Temp:  [97.3 F (36.3 C)-98.7 F (37.1 C)] 98.2 F (36.8 C) (10/20 0621) Pulse Rate:  [69-79] 73 (10/20 0621) Cardiac Rhythm:  [-] Normal sinus rhythm (10/19 1904) Resp:  [16-20] 16 (10/20 0621) BP: (90-137)/(52-69) 129/63 mmHg (10/20 0621) SpO2:  [96 %-99 %] 96 % (10/20 0621) Weight:  [177 lb 14.6 oz (80.7 kg)-188 lb 7.9 oz (85.5 kg)] 183 lb 3.2 oz (83.1 kg) (10/20 0621)  Pre op weight 80 kg Current Weight  06/05/15 183 lb 3.2 oz (83.1 kg)      Intake/Output from previous day: 10/19 0701 - 10/20 0700 In: 510 [P.O.:480; I.V.:30] Out: 3003    Physical Exam:  Cardiovascular: RRR Pulmonary: Mostly clear to auscultation bilaterally; no rales, wheezes, or rhonchi. Abdomen: Soft, non tender, bowel sounds present. Extremities: Trace bilateral lower extremity edema. Wounds: Clean and dry.  No erythema or signs of infection.  Lab Results: CBC:  Recent Labs  06/03/15 0745 06/04/15 0505  WBC 9.0 7.3  HGB 10.8* 10.1*  HCT 33.2* 31.8*  PLT 152 153   BMET:   Recent Labs  06/03/15 0745 06/04/15 0505  NA 131* 135  K 4.3 3.6  CL 91* 94*  CO2 28 28  GLUCOSE 217* 255*  BUN 19 18  CREATININE 3.92* 3.46*  CALCIUM 8.4* 8.5*    PT/INR:  Lab Results  Component Value Date   INR 1.30 05/30/2015   INR 1.41 05/26/2015   ABG:  INR: Will add last result for INR, ABG once components are confirmed Will add last 4 CBG results once components are confirmed  Assessment/Plan:  1. CV - S/p  NSTEMI. Had some ventricular bigeminy. On Coreg 3.125 mg bid 2.  Pulmonary - On room air. Encourage incentive spirometer 3. ESRD-HD daily while in hospital. Per nephrology 4.  Acute blood loss anemia - Last H and H stable at 10.8 and 31.8. On Aranesp 5.DM-CBGs 240/303/218. On Insulin scheduled at bedtime. Will give bid for better glucose control. Pre op HGA1C 9.3. Will need close follow up with medical doctor after discharge. 6. Regarding tongue, no evidence of thrush. Will order oral mouth care. 7. Hopefully, will be CIR candidate  ZIMMERMAN,DONIELLE MPA-C 06/05/2015,7:48 AM   patient examined and medical record reviewed,agree with above note. Kathlee Nationseter Van Trigt III 06/06/2015

## 2015-06-05 NOTE — Care Management Note (Addendum)
Case Management Note CM note started by [name] RNCM   Patient Details  Name: Daniel Anthony MRN: [id number] Date of Birth: [birth year]  Subjective/Objective:    Pt admitted with NON STEMI, CABG scheduled for 05/30/15                Action/Plan:  Pt is from home with wife.  Per pt; he has been suffering from neuropathy for over 14 years and is not independently ambulating at home.  Pt states he uses wheelchair outside of home but is able to manage with cane inside home.  Post surgery Cardiac Rehab will work with pt and provide recommendations for discharge needs.  CM will continue to monitor.   Expected Discharge Date:                  Expected Discharge Plan:  Home w Home Health Services  In-House Referral:     Discharge planning Services  CM Consult  Post Acute Care Choice:  Durable Medical Equipment, Home Health Choice offered to:  Patient, Spouse  DME Arranged:  3-N-1 DME Agency:  [facility]  HH Arranged:    HH Agency:  [facility]  Status of Service:  In process, will continue to follow  Medicare Important Message Given:  Yes-fourth notification given Date Medicare IM Given:    Medicare IM give by:    Date Additional Medicare IM Given:    Additional Medicare Important Message give by:     If discussed at Long Length of Stay Meetings, dates discussed:  06/05/15  Additional Comments:  06/05/15- CIR consulted for possible admission- however per [name] with CIR - insurance has denied admission to CIR- spoke with pt and wife at bedside regarding insurance denial for CIR and plans for discharge- discussed home with HH vs STSNF which pt would need to be set up with outpt HD prior to SNF - per pt he would prefer to return home and continue to do home HD with wife support- states that he has a lift chair at home along with cane and walker, states that he would like a 3n1 for home- reports that they would have friend/family that could assist them and  offer supervision when wife not there. Wife agreeable to plan to return home- [facility] [place]- per pt/wife [facility] to [facility] will f/u for choice- notified Randall Hissonnielle Zimmerman PA of CIR denial and [facility]- will need order for HH-PT/OT with F2F and DME 3n1.   Darrold SpanWebster, Anel Creighton Hall, RN 06/05/2015, 2:35 PM

## 2015-06-05 NOTE — Progress Notes (Signed)
Physical Therapy Treatment Patient Details Name: Daniel Anthony MRN: [id number] DOB: 06-28-1951 Today's Date: 06/05/2015    History of Present Illness pt is a 64 year old Caucasian male with history of coronary artery disease and prior stents, DM, CIPD, foot drop, ESRD  . Transferred from outside hospital for non-ST elevation MI.  Cath shows 3 vessel ds.  s/p CABG 10/14.    PT Comments    Patient not feeling well today. Tolerated some standing and side stepping along side bed but declined further ambulation. Following sternal precautions well during mobility today without cues. Continues to have some difficulty standing without assist of 2. Will continue to follow to maximize independence and mobility.   Follow Up Recommendations  CIR     Equipment Recommendations  None recommended by PT    Recommendations for Other Services       Precautions / Restrictions Precautions Precautions: Fall;Sternal Restrictions Weight Bearing Restrictions: No Other Position/Activity Restrictions: Has Bil AFOs- to bring from home    Mobility  Bed Mobility Overal bed mobility: Needs Assistance Bed Mobility: Supine to Sit     Supine to sit: Mod assist;HOB elevated Sit to supine: Mod assist;+2 for physical assistance;HOB elevated   General bed mobility comments: Mod A to elevate trunk to get to EOB. Able to mobilize LEs pretty well. Assist to bring BLEs into bed and scoot hips to return to supine. Following sternal precautions well during bed mobility.  Transfers Overall transfer level: Needs assistance Equipment used: Rolling walker (2 wheeled) Transfers: Sit to/from Stand Sit to Stand: Max assist;+2 physical assistance         General transfer comment: Cues for use of body momentum and anterior translation to boost from EOB. Manual assist for foot placement 2/2 to tight heel cords. Stood from [employer]. Initially pushing posteriorly upon standing. Better able to perform anterior weight shift  on second attempt.  Ambulation/Gait Ambulation/Gait assistance: Min assist Ambulation Distance (Feet): 3 Feet Assistive device: Rolling walker (2 wheeled) Gait Pattern/deviations: Step-to pattern;Decreased dorsiflexion - right;Decreased dorsiflexion - left   Gait velocity interpretation: Below normal speed for age/gender General Gait Details: Able to perform some side stepping along side bed to left with RW. Declined further ambulation due to not feeling well.   Stairs            Wheelchair Mobility    Modified Rankin (Stroke Patients Only)       Balance Overall balance assessment: Needs assistance Sitting-balance support: Feet supported;No upper extremity supported Sitting balance-Leahy Scale: Fair Sitting balance - Comments: Able to sit unsupported today; able to perform reciprocal scooting to EOB without LOB.   Standing balance support: During functional activity Standing balance-Leahy Scale: Poor                      Cognition Arousal/Alertness: Awake/alert Behavior During Therapy: WFL for tasks assessed/performed Overall Cognitive Status: Within Functional Limits for tasks assessed       Memory: Decreased short-term memory              Exercises      General Comments General comments (skin integrity, edema, etc.): SPouse present during session.      Pertinent Vitals/Pain Pain Assessment: Faces Faces Pain Scale: Hurts a little bit Pain Location: back, neck Pain Descriptors / Indicators: Sore Pain Intervention(s): Monitored during session;Repositioned;Heat applied    Home Living  Prior Function            PT Goals (current goals can now be found in the care plan section) Progress towards PT goals: Not progressing toward goals - comment (2/2 to not feeling well today.)    Frequency  Min 3X/week    PT Plan Current plan remains appropriate    Co-evaluation             End of Session Equipment  Utilized During Treatment: Gait belt Activity Tolerance: Patient limited by fatigue;Patient limited by pain Patient left: in bed;with call bell/phone within reach;with family/visitor present     Time: 0921-0939 PT Time Calculation (min) (ACUTE ONLY): 18 min  Charges:  $Therapeutic Activity: 8-22 mins                    G Codes:      Taetum Flewellen A Akasia Ahmad 06/05/2015, 10:10 AM Mylo RedShauna Jossilyn Benda, PT, DPT [phone]

## 2015-06-05 NOTE — Progress Notes (Signed)
Patient Name: Daniel Anthony Date of Encounter: 06/05/2015   SUBJECTIVE  Feeling weak like "someone hitting with hammer all over the body". No CP, SOB or palpitation. He is a retired Development worker, community. Cardiology called today for ventricular bigeminy.   CURRENT MEDS . amiodarone  200 mg Oral BID  . aspirin EC  325 mg Oral Daily   Or  . aspirin  324 mg Per Tube Daily  . bisacodyl  10 mg Oral Daily   Or  . bisacodyl  10 mg Rectal Daily  . calcitRIOL  0.25 mcg Oral BID  . calcium acetate  667 mg Oral TID WC  . carvedilol  3.125 mg Oral BID WC  . chlorhexidine  15 mL Mouth/Throat BID  . cinacalcet  30 mg Oral QHS  . [START ON 06/11/2015] darbepoetin (ARANESP) injection - DIALYSIS  200 mcg Intravenous Q Wed-HD  . docusate sodium  200 mg Oral Daily  . escitalopram  20 mg Oral QHS  . fentaNYL  100 mcg Transdermal Q72H  . insulin aspart  0-9 Units Subcutaneous TID WC  . insulin detemir  10 Units Subcutaneous BID  . multivitamin  1 tablet Oral QHS  . pantoprazole  40 mg Oral Daily  . polyethylene glycol  8.5-17 g Oral QHS  . rosuvastatin  40 mg Oral QHS  . sodium chloride  3 mL Intravenous Q12H  . tamsulosin  0.4 mg Oral QHS    OBJECTIVE  Filed Vitals:   06/04/15 2059 06/05/15 0621 06/05/15 0835 06/05/15 1326  BP: 137/67 129/63 125/65 113/42  Pulse: 79 73 75 80  Temp: 98.7 F (37.1 C) 98.2 F (36.8 C)  98.9 F (37.2 C)  TempSrc: Oral Oral  Oral  Resp: 18 16  17   Height:  5\' 9"  (1.753 m)    Weight:  183 lb 3.2 oz (83.1 kg)    SpO2: 98% 96%  100%    Intake/Output Summary (Last 24 hours) at 06/05/15 1423 Last data filed at 06/05/15 1330  Gross per 24 hour  Intake    870 ml  Output      0 ml  Net    870 ml   Filed Weights   06/04/15 0842 06/04/15 1315 06/05/15 0621  Weight: 188 lb 7.9 oz (85.5 kg) 177 lb 14.6 oz (80.7 kg) 183 lb 3.2 oz (83.1 kg)    PHYSICAL EXAM  General: Pleasant, NAD. Neuro: Alert and oriented X 3. Moves all extremities spontaneously. Psych:  Normal affect. HEENT:  Normal  Neck: Supple without bruits or JVD. Lungs:  Resp regular and unlabored. Faint rales bibasilar with diminished breath sound.  Heart: RRR no s3, s4, or murmurs. Abdomen: Soft, non-tender, non-distended, BS + x 4.  Extremities: No clubbing, cyanosis or edema. DP/PT/Radials 2+ and equal bilaterally.  Accessory Clinical Findings  CBC  Recent Labs  06/03/15 0745 06/04/15 0505  WBC 9.0 7.3  HGB 10.8* 10.1*  HCT 33.2* 31.8*  MCV 95.1 94.9  PLT 152 153   Basic Metabolic Panel  Recent Labs  06/03/15 0745 06/04/15 0505  NA 131* 135  K 4.3 3.6  CL 91* 94*  CO2 28 28  GLUCOSE 217* 255*  BUN 19 18  CREATININE 3.92* 3.46*  CALCIUM 8.4* 8.5*   BMP Latest Ref Rng 06/05/2015 06/04/2015 06/03/2015  Glucose 65 - 99 mg/dL - 409(W) 119(J)  BUN 6 - 20 mg/dL - 18 19  Creatinine 4.78 - 1.24 mg/dL - 2.95(A) 2.13(Y)  Sodium 135 - 145 mmol/L -  135 131(L)  Potassium 3.5 - 5.1 mmol/L 4.5 3.6 4.3  Chloride 101 - 111 mmol/L - 94(L) 91(L)  CO2 22 - 32 mmol/L - 28 28  Calcium 8.9 - 10.3 mg/dL - 8.5(L) 8.4(L)    Liver Function Tests  Recent Labs  06/03/15 0745  AST 54*  ALT 57  ALKPHOS 443*  BILITOT 1.8*  PROT 5.7*  ALBUMIN 1.9*    TELE  Ventricular bigeminy 70-80s.   Radiology/Studies  Dg Chest 1 View  05/26/2015  CLINICAL DATA:  Possible syncopal episode with bradycardia and brief episode of unresponsiveness. Hyperglycemia. EXAM: CHEST 1 VIEW COMPARISON:  08/16/2013 FINDINGS: Shallow inspiration with atelectasis in the lung bases. Cardiac enlargement with mild pulmonary vascular congestion, developing since previous study. Mild perihilar infiltration suggesting early edema. Small right pleural effusion. No pneumothorax. Calcified and tortuous aorta. IMPRESSION: Shallow inspiration with linear atelectasis in the mid and lower lungs. Cardiac enlargement with mild pulmonary vascular congestion, mild perihilar edema, and small right pleural effusion.  Electronically Signed   By: Burman Nieves M.D.   On: 05/26/2015 00:06   Ct Chest Wo Contrast  05/28/2015  CLINICAL DATA:  Shortness of breath. Anterior chest pain, right jaw pain. EXAM: CT CHEST WITHOUT CONTRAST TECHNIQUE: Multidetector CT imaging of the chest was performed following the standard protocol without IV contrast. COMPARISON:  Chest x-ray 05/25/2015.  Chest CT 06/24/2005 FINDINGS: Small bilateral pleural effusions. Compressive atelectasis in the lower lobes bilaterally. Ground-glass opacities in the right upper lobe and superior segment of the right lower lobe as well as superior segment of left lower lobe. Cannot exclude areas of pneumonia. Heart is enlarged. Densely calcified coronary arteries diffusely. Aorta is calcified, non aneurysmal. No mediastinal, hilar, or axillary adenopathy. Chest wall soft tissues are unremarkable. Imaging into the upper abdomen shows no acute findings. No acute bony abnormality or focal bone lesion. IMPRESSION: Small bilateral pleural effusions with compressive atelectasis in the lower lobes. Ground-glass opacities in the superior segments of the lower lobes and inferior right upper lobe. Cannot exclude pneumonia. Severe coronary artery disease. Cardiomegaly. Electronically Signed   By: Charlett Nose M.D.   On: 05/28/2015 12:46   Dg Chest Port 1 View  06/03/2015  CLINICAL DATA:  Patient with shortness of breath. EXAM: PORTABLE CHEST 1 VIEW COMPARISON:  Chest radiograph 06/02/2015 FINDINGS: Left subclavian central venous catheter tip projects over the superior vena cava. Interval removal right IJ central venous catheter. Monitoring leads overlie the patient. Stable cardiomegaly status post median sternotomy and CABG procedure. Low lung volumes. Stable small bilateral pleural effusions with left-greater-than-right basilar heterogeneous pulmonary opacities. No pneumothorax. IMPRESSION: Left subclavian central venous catheter tip projects over the superior vena cava.  Cardiomegaly. Bilateral small pleural effusions with left-greater-than-right basilar heterogeneous opacities. Electronically Signed   By: Annia Belt M.D.   On: 06/03/2015 08:24   Dg Chest Port 1 View  06/02/2015  CLINICAL DATA:  Status post CABG. EXAM: PORTABLE CHEST 1 VIEW COMPARISON:  Chest radiograph from one day prior. FINDINGS: Right internal jugular central venous sheath terminates in the upper third of the superior vena cava. Left subclavian central venous catheter terminates over the upper right atrium. Median sternotomy wires are aligned and intact. Stable cardiomediastinal silhouette with mild cardiomegaly. No pneumothorax. Stable slight blunting of the right costophrenic angle, suggesting a trace right pleural effusion. No left pleural effusion. Stable mild to moderate bibasilar atelectasis. No overt pulmonary edema. IMPRESSION: 1. Stable mild cardiomegaly without overt pulmonary edema. 2. Stable trace right pleural effusion.  3. Stable mild to moderate bibasilar atelectasis. Electronically Signed   By: Delbert PhenixJason A Poff M.D.   On: 06/02/2015 07:41   Dg Chest Port 1 View  06/01/2015  CLINICAL DATA:  05-30-15 S/p CABG x 3  diabetes.  Hyperlipidemia. EXAM: PORTABLE CHEST 1 VIEW COMPARISON:  1 day prior FINDINGS: Right IJ Cordis sheath remains. Swan-Ganz catheter removed. Left-sided subclavian line tip at low SVC. Removal of mediastinal drain and left chest tube. Midline trachea. Cardiomegaly accentuated by AP portable technique. Atherosclerosis in the transverse aorta. Trace bilateral pleural fluid. No pneumothorax. Low lung volumes with resultant pulmonary interstitial prominence. No congestive failure. Left greater than right bibasilar airspace disease. Diminished lung volumes. IMPRESSION: Minimal worsening of left-sided aeration, likely due to progressive atelectasis. Otherwise, similar appearance of the chest with low lung volumes, cardiomegaly, and trace bilateral pleural fluid. Resolved pulmonary  venous congestion. Electronically Signed   By: Jeronimo GreavesKyle  Talbot M.D.   On: 06/01/2015 08:58   Dg Chest Port 1 View  05/31/2015  CLINICAL DATA:  Status post CABG x3. EXAM: PORTABLE CHEST 1 VIEW COMPARISON:  05/30/2015 FINDINGS: Right IJ Swan-Ganz catheter unchanged in position with tip at proximal right pulmonary artery. Mediastinal drain. Left-sided subclavian line tip not well visualized. Prior median sternotomy. Left chest tube is unchanged. Cardiomegaly accentuated by AP portable technique. Suspect trace right pleural fluid. No pneumothorax. Mild pulmonary venous congestion, accentuated by low lung volumes. Increasing right hemidiaphragm elevation with adjacent atelectasis. IMPRESSION: Cardiomegaly with developing mild pulmonary venous congestion. Progressive right hemidiaphragm elevation with overlying atelectasis and volume loss. Electronically Signed   By: Jeronimo GreavesKyle  Talbot M.D.   On: 05/31/2015 10:37   Dg Chest Port 1 View  05/30/2015  CLINICAL DATA:  Status post coronary artery bypass grafting. Hypoxia. EXAM: PORTABLE CHEST 1 VIEW COMPARISON:  Chest radiograph May 25, 2015; chest CT May 28, 2015 FINDINGS: Endotracheal tube tip is 2.8 cm above the carina. Swan-Ganz catheter tip is in the right main pulmonary artery. A second central catheter is present with the tip in the right atrium. There is a left chest tube and a mediastinal drain. Nasogastric tube tip and side port are below the diaphragm. There is no demonstrable pneumothorax. There is slight bibasilar atelectasis. Lungs elsewhere clear. There is a minimal right pleural effusion. Heart is borderline enlarged with pulmonary vascularity within normal limits. No adenopathy. IMPRESSION: Tube and catheter positions as described without pneumothorax. Slight bibasilar atelectasis. Minimal right effusion. Lungs elsewhere clear. Heart prominent but stable. Electronically Signed   By: Bretta BangWilliam  Woodruff III M.D.   On: 05/30/2015 13:54    ASSESSMENT AND  PLAN Active Problems:   CAD (coronary artery disease)   Non-STEMI (non-ST elevated myocardial infarction) (HCC)   Coronary artery disease involving native coronary artery of native heart with unstable angina pectoris (HCC)   Cardiomyopathy, ischemic   Anemia of chronic disease   Chronic pain    Plan:  NSTEMI- now s/p CABG x 3. (LIMA to LAD, SVG to CIRCUMFLEX, and SVG to PDA) with EVH from right thigh greater saphenous vein and left internal mammary artery harvest (N/A). His BB was held on admission due to ?Syncope with transient bradycardia. Now on Coreg 3.125mg  BID post CABG.  Cardiology is asked to see today for ventricular bigeminy @ rate of 70-80s. K of 3.6 early morning. Started Amiodarone 200 mg bid, and Lidocaine 50 mg IV through his central line by surgery. Pending recheck K. Further management per MD.  On HD - MWF.   Signed, Entergy CorporationBhagat,Bhavinkumar  PA-C   Patient seen and examined. Agree with assessment and plan. Feels week. Bigeminal rhythm with controlled rate. Amiodarone was just started. K 3.6; Mg 2.2. F/U K 4.5 on re-check. CXR on 10/18 with bilateral small pleural effusions with basilar opacities. For dialysis tomorrow. Will increase carvedilol to 6.25 mg bid.   Lennette Bihari, MD, [facility] 06/05/2015 3:27 PM

## 2015-06-05 NOTE — Progress Notes (Signed)
 [facility] Progress Note   Subjective: very tired today, "hit by a brick wall", nothing specific  Filed Vitals:   06/04/15 1632 06/04/15 2059 06/05/15 0621 06/05/15 0835  BP: 122/59 137/67 129/63 125/65  Pulse: 75 79 73 75  Temp:  98.7 F (37.1 C) 98.2 F (36.8 C)   TempSrc:  Oral Oral   Resp: 20 18 16    Height:   5\' 9"  (1.753 m)   Weight:   83.1 kg (183 lb 3.2 oz)   SpO2: 97% 98% 96%    Exam: Alert, no distress Chest clear ant / lat RRR no rub Abd soft / ntnd 1+ LE edema Neuro alert nf LFA AVF +bruit Neuro is ox 3, gen weak   Home HD / K [facility] - MWF 4h  79kg  2/2.25 bath  LUE AVF   Calc 0.25 ug daily Mircera yes?  Last P 6.2, pth 549, tsat 53, ferr 935      Assessment: 1 ESRD on home HD, cont MWF here 2 NSTEMI - s/p CABG 10/14, stable 3 Vol is 3-4 kg up now, stable and asymptomatic 4 Anemia cont esa 5 MBD cont meds 6 HTN good bp control 7 Chronic polyneuropathy/ debility 8 Dispo - poss CIR vs SNF  Plan - HD Friday   Vinson Moselleob Doneta Bayman MD [facility] pager [phone]    cell [phone] 06/05/2015, 12:09 PM    Recent Labs Lab 06/02/15 0600 06/03/15 0745 06/04/15 0505  NA 132* 131* 135  K 3.5 4.3 3.6  CL 94* 91* 94*  CO2 27 28 28   GLUCOSE 54* 217* 255*  BUN 19 19 18   CREATININE 3.70* 3.92* 3.46*  CALCIUM 7.8* 8.4* 8.5*  PHOS 4.6  --   --     Recent Labs Lab 06/02/15 0600 06/03/15 0745  AST  --  54*  ALT  --  57  ALKPHOS  --  443*  BILITOT  --  1.8*  PROT  --  5.7*  ALBUMIN 1.8* 1.9*    Recent Labs Lab 06/02/15 0600 06/03/15 0745 06/04/15 0505  WBC 9.8 9.0 7.3  HGB 10.2* 10.8* 10.1*  HCT 30.6* 33.2* 31.8*  MCV 90.8 95.1 94.9  PLT 113* 152 153   . aspirin EC  325 mg Oral Daily   Or  . aspirin  324 mg Per Tube Daily  . bisacodyl  10 mg Oral Daily   Or  . bisacodyl  10 mg Rectal Daily  . calcitRIOL  0.25 mcg Oral BID  . calcium acetate  667 mg Oral TID WC  . carvedilol  3.125 mg Oral BID WC  .  chlorhexidine  15 mL Mouth/Throat BID  . cinacalcet  30 mg Oral QHS  . [START ON 06/11/2015] darbepoetin (ARANESP) injection - DIALYSIS  200 mcg Intravenous Q Wed-HD  . docusate sodium  200 mg Oral Daily  . escitalopram  20 mg Oral QHS  . fentaNYL  100 mcg Transdermal Q72H  . insulin aspart  0-9 Units Subcutaneous TID WC  . insulin detemir  10 Units Subcutaneous BID  . multivitamin  1 tablet Oral QHS  . pantoprazole  40 mg Oral Daily  . polyethylene glycol  8.5-17 g Oral QHS  . rosuvastatin  40 mg Oral QHS  . sodium chloride  3 mL Intravenous Q12H  . tamsulosin  0.4 mg Oral QHS     heparin, HYDROmorphone, Influenza vac split quadrivalent PF, LORazepam, menthol-cetylpyridinium, metoprolol, ondansetron (ZOFRAN) IV, phenol, sodium chloride, sodium chloride

## 2015-06-05 NOTE — Progress Notes (Signed)
Rehab admissions - I have received a denial from insurance carrier for acute inpatient rehab stating that his needs can be met at a lower level of care such as SNF or HH.  I have informed the case manager and social worker is also aware.  Call me for questions.  #[phone]

## 2015-06-05 NOTE — Progress Notes (Signed)
Attempted to see pt.  Pt not feeling well at all and declined therapy despite encouragement to participate. Will check back to evaluate 10.21 Tory EmeraldHolly Deondrick Searls, OTR/L [phone]

## 2015-06-05 NOTE — Progress Notes (Signed)
Patient feeling weak and lethargic. HR Ventricular Bigeminy 70's-80's. VSS. PA notified. Will continue to monitor.

## 2015-06-05 NOTE — Care Management Important Message (Signed)
Important Message  Patient Details  Name: Daniel Anthony MRN: [id number] Date of Birth: 1951-06-12   Medicare Important Message Given:  Yes-fourth notification given    Kyla BalzarineShealy, Zakk Borgen Abena 06/05/2015, 12:35 PM

## 2015-06-05 NOTE — Progress Notes (Signed)
Utilization review completed.  

## 2015-06-05 NOTE — Progress Notes (Signed)
Nurse informed me patient is in ventricular bigeminy mostly. His HR is in the 80's and last BP was 113/42. Per my discussion with Dr. Donata ClayVan Trigt, I will check a potassium, Amiodarone 200 mg bid, and Lidocaine 50 mg IV through his central line. I also consulted cardiology.

## 2015-06-06 ENCOUNTER — Inpatient Hospital Stay (HOSPITAL_COMMUNITY): Payer: Medicare Other

## 2015-06-06 ENCOUNTER — Other Ambulatory Visit (HOSPITAL_COMMUNITY): Payer: Medicare Other

## 2015-06-06 DIAGNOSIS — I2510 Atherosclerotic heart disease of native coronary artery without angina pectoris: Secondary | ICD-10-CM

## 2015-06-06 LAB — GLUCOSE, CAPILLARY
GLUCOSE-CAPILLARY: 120 mg/dL — AB (ref 65–99)
GLUCOSE-CAPILLARY: 120 mg/dL — AB (ref 65–99)
Glucose-Capillary: 82 mg/dL (ref 65–99)
Glucose-Capillary: 122 mg/dL — ABNORMAL HIGH (ref 65–99)
Glucose-Capillary: 113 mg/dL — ABNORMAL HIGH (ref 65–99)

## 2015-06-06 MED ORDER — ALTEPLASE 2 MG IJ SOLR
2.0000 mg | Freq: Once | INTRAMUSCULAR | Status: DC | PRN
  Filled 2015-06-06: qty 2

## 2015-06-06 MED ORDER — SODIUM CHLORIDE 0.9 % IV SOLN: 100.0000 mL | INTRAVENOUS | Status: DC | PRN

## 2015-06-06 MED ORDER — CARVEDILOL 6.25 MG PO TABS: 6.2500 mg | ORAL_TABLET | Freq: Two times a day (BID) | ORAL | Status: DC

## 2015-06-06 MED ORDER — PENTAFLUOROPROP-TETRAFLUOROETH EX AERO: 1.0000 "application " | INHALATION_SPRAY | CUTANEOUS | Status: DC | PRN

## 2015-06-06 MED ORDER — LIDOCAINE HCL (PF) 1 % IJ SOLN: 5.0000 mL | INTRAMUSCULAR | Status: DC | PRN

## 2015-06-06 MED ORDER — HEPARIN SODIUM (PORCINE) 1000 UNIT/ML DIALYSIS: 1000.0000 [IU] | INTRAMUSCULAR | Status: DC | PRN

## 2015-06-06 MED ORDER — LIDOCAINE-PRILOCAINE 2.5-2.5 % EX CREA: 1.0000 "application " | TOPICAL_CREAM | CUTANEOUS | Status: DC | PRN

## 2015-06-06 MED ORDER — AMIODARONE HCL 200 MG PO TABS: 200.0000 mg | ORAL_TABLET | Freq: Two times a day (BID) | ORAL | Status: DC

## 2015-06-06 NOTE — Progress Notes (Signed)
Echocardiogram 2D Echocardiogram limited has been performed.  Daniel Anthony, [name] 06/06/2015, 4:24 PM

## 2015-06-06 NOTE — Procedures (Addendum)
Had bigeminy last night rx with amio and IV lidocaine. K up last check at 4.5. In NSR rhythm.  Vol stable, stable from renal standpoint. He says poss dc tomorrow if remains in NSR.     I was present at this dialysis session, have reviewed the session itself and made  appropriate changes Vinson Moselleob Theodore Rahrig MD [facility] pager [phone]    cell [phone] 06/06/2015, 9:43 AM

## 2015-06-06 NOTE — Progress Notes (Signed)
      [address]       [place] [id number]             [phone]        7 Days Post-Op Procedure(s) (LRB):  CORONARY ARTERY BYPASS GRAFTING (CABG) x 3 (LIMA to LAD, SVG to CIRCUMFLEX, and SVG to PDA) with EVH from right thigh greater saphenous vein and left internal mammary artery harvest (N/A) TRANSESOPHAGEAL ECHOCARDIOGRAM (TEE) (N/A)  Subjective: Patient getting ready to go to HD. He feels better now that he is in sinus rhythm.  Objective: Vital signs in last 24 hours: Temp:  [98 F (36.7 C)-98.9 F (37.2 C)] 98 F (36.7 C) (10/21 0645) Pulse Rate:  [67-80] 67 (10/21 0645) Cardiac Rhythm:  [-] Normal sinus rhythm (10/21 0701) Resp:  [16-20] 20 (10/21 0645) BP: (113-125)/(42-71) 120/71 mmHg (10/21 0645) SpO2:  [99 %-100 %] 99 % (10/21 0645) Weight:  [183 lb 6.8 oz (83.2 kg)] 183 lb 6.8 oz (83.2 kg) (10/21 0645)  Pre op weight 80 kg Current Weight  06/06/15 183 lb 6.8 oz (83.2 kg)      Intake/Output from previous day: 10/20 0701 - 10/21 0700 In: 360 [P.O.:360] Out: -    Physical Exam:  Cardiovascular: RRR Pulmonary: Mostly clear to auscultation bilaterally; no rales, wheezes, or rhonchi. Abdomen: Soft, non tender, bowel sounds present. Extremities: Trace bilateral lower extremity edema. Wounds: Clean and dry.  No erythema or signs of infection.  Lab Results: CBC:  Recent Labs  06/04/15 0505  WBC 7.3  HGB 10.1*  HCT 31.8*  PLT 153   BMET:   Recent Labs  06/04/15 0505 06/05/15 1503  NA 135  --   K 3.6 4.5  CL 94*  --   CO2 28  --   GLUCOSE 255*  --   BUN 18  --   CREATININE 3.46*  --   CALCIUM 8.5*  --     PT/INR:  Lab Results  Component Value Date   INR 1.30 05/30/2015   INR 1.41 05/26/2015   ABG:  INR: Will add last result for INR, ABG once components are confirmed Will add last 4 CBG results once components are confirmed  Assessment/Plan:  1. CV - S/p NSTEMI. Had ventricular bigeminy most of yesterday. Was  given Lidocaine and Amiodarone yesterday. Converted to SR about 1 am. HR in the 60's mostly.On Coreg 6.25 mg bid and Amiodarone 200 mg bid. May need to decrease BB if HR remains 60's. 2.  Pulmonary - On room air. Encourage incentive spirometer 3. ESRD-HD daily while in hospital. Per nephrology 4.  Acute blood loss anemia - Last H and H stable at 10.8 and 31.8. On Aranesp 5.DM-CBGs 224/189/120. On Insulin bid with better control of glucose . Pre op HGA1C 9.3. Will need close follow up with medical doctor after discharge. 7. Hopefully, home with HH,PT,OT in am  ZIMMERMAN,DONIELLE MPA-C 06/06/2015,8:02 AM

## 2015-06-06 NOTE — Progress Notes (Signed)
Patient Profile: 64 y/o male, admitted for NSTEMI. Required CABG. Now day 7 post-op.   Subjective: Feels significantly better now that he is back in NSR. Denies CP or dyspnea.   Objective: Vital signs in last 24 hours: Temp:  [97.4 F (36.3 C)-98.9 F (37.2 C)] 98.6 F (37 C) (10/21 1233) Pulse Rate:  [64-80] 74 (10/21 1233) Resp:  [7-22] 15 (10/21 1233) BP: (91-122)/(42-71) 107/52 mmHg (10/21 1233) SpO2:  [97 %-100 %] 97 % (10/21 1233) Weight:  [170 lb 10.2 oz (77.4 kg)-183 lb 6.8 oz (83.2 kg)] 170 lb 10.2 oz (77.4 kg) (10/21 1200) Last BM Date: 06/04/15  Intake/Output from previous day: 10/20 0701 - 10/21 0700 In: 360 [P.O.:360] Out: -  Intake/Output this shift: Total I/O In: -  Out: 1500 [Other:1500]  Medications Current Facility-Administered Medications  Medication Dose Route Frequency Provider Last Rate Last Dose  . amiodarone (PACERONE) tablet 200 mg  200 mg Oral BID Nani Skillern, PA-C   200 mg at 06/06/15 1246  . aspirin EC tablet 325 mg  325 mg Oral Daily Nani Skillern, PA-C   325 mg at 06/06/15 1246   Or  . aspirin chewable tablet 324 mg  324 mg Per Tube Daily Donielle Liston Alba, PA-C      . bisacodyl (DULCOLAX) EC tablet 10 mg  10 mg Oral Daily Nani Skillern, PA-C   10 mg at 06/01/15 1010   Or  . bisacodyl (DULCOLAX) suppository 10 mg  10 mg Rectal Daily Donielle Liston Alba, PA-C      . calcitRIOL (ROCALTROL) capsule 0.25 mcg  0.25 mcg Oral BID Javier Glazier, MD   0.25 mcg at 06/06/15 1246  . calcium acetate (PHOSLO) capsule 667 mg  667 mg Oral TID WC Javier Glazier, MD   667 mg at 06/04/15 1423  . carvedilol (COREG) tablet 6.25 mg  6.25 mg Oral BID WC Troy Sine, MD   6.25 mg at 06/05/15 1753  . chlorhexidine (PERIDEX) 0.12 % solution 15 mL  15 mL Mouth/Throat BID Nani Skillern, PA-C   15 mL at 06/06/15 1247  . cinacalcet (SENSIPAR) tablet 30 mg  30 mg Oral QHS Melrose Nakayama, MD   30 mg at 06/05/15 2133  .  [START ON 06/11/2015] Darbepoetin Alfa (ARANESP) injection 200 mcg  200 mcg Intravenous Q Wed-HD Ivin Poot, MD      . docusate sodium (COLACE) capsule 200 mg  200 mg Oral Daily Donielle Liston Alba, PA-C   200 mg at 06/01/15 1009  . escitalopram (LEXAPRO) tablet 20 mg  20 mg Oral QHS Javier Glazier, MD   20 mg at 06/05/15 2133  . fentaNYL (DURAGESIC - dosed mcg/hr) 100 mcg  100 mcg Transdermal Q72H Ivin Poot, MD   100 mcg at 06/04/15 1422  . HYDROmorphone (DILAUDID) tablet 2-4 mg  2-4 mg Oral Q4H PRN Melrose Nakayama, MD   2 mg at 06/05/15 2149  . Influenza vac split quadrivalent PF (FLUARIX) injection 0.5 mL  0.5 mL Intramuscular Prior to discharge Ivin Poot, MD      . insulin aspart (novoLOG) injection 0-9 Units  0-9 Units Subcutaneous TID WC Melrose Nakayama, MD   3 Units at 06/05/15 1725  . insulin detemir (LEVEMIR) injection 10 Units  10 Units Subcutaneous BID Nani Skillern, PA-C   10 Units at 06/05/15 2133  . LORazepam (ATIVAN) tablet 0.5 mg  0.5 mg Oral TID PRN Remo Lipps  Chaya Jan, MD   0.5 mg at 06/06/15 1247  . menthol-cetylpyridinium (CEPACOL) lozenge 3 mg  1 lozenge Oral PRN Melrose Nakayama, MD   3 mg at 06/01/15 1741  . metoprolol (LOPRESSOR) injection 2.5-5 mg  2.5-5 mg Intravenous Q2H PRN Nani Skillern, PA-C      . multivitamin (RENA-VIT) tablet 1 tablet  1 tablet Oral QHS Javier Glazier, MD   1 tablet at 06/05/15 2132  . ondansetron (ZOFRAN) injection 4 mg  4 mg Intravenous Q6H PRN Donielle Liston Alba, PA-C      . pantoprazole (PROTONIX) EC tablet 40 mg  40 mg Oral Daily Nani Skillern, PA-C   40 mg at 06/06/15 1246  . phenol (CHLORASEPTIC) mouth spray 1 spray  1 spray Mouth/Throat PRN Melrose Nakayama, MD   1 spray at 06/01/15 1010  . polyethylene glycol (MIRALAX / GLYCOLAX) packet 8.5-17 g  8.5-17 g Oral QHS Melrose Nakayama, MD   8.5 g at 05/31/15 2200  . rosuvastatin (CRESTOR) tablet 40 mg  40 mg Oral QHS Nani Skillern, PA-C   40 mg at 06/06/15 0032  . sodium chloride 0.9 % injection 10-40 mL  10-40 mL Intracatheter PRN Ivin Poot, MD   10 mL at 06/05/15 0427  . sodium chloride 0.9 % injection 3 mL  3 mL Intravenous Q12H Nani Skillern, PA-C   3 mL at 06/05/15 2141  . sodium chloride 0.9 % injection 3 mL  3 mL Intravenous PRN Nani Skillern, PA-C   3 mL at 05/31/15 0923  . tamsulosin (FLOMAX) capsule 0.4 mg  0.4 mg Oral QHS Javier Glazier, MD   0.4 mg at 06/05/15 2133    PE: General appearance: alert, cooperative and no distress Neck: no carotid bruit and no JVD Lungs: faint crackles at the bases Heart: regular rate and rhythm Extremities: trace- 1+ pitting ankle edema R>L Pulses: 2+ and symmetric Skin: warm and dry Neurologic: Grossly normal  Lab Results:   Recent Labs  06/04/15 0505  WBC 7.3  HGB 10.1*  HCT 31.8*  PLT 153   BMET  Recent Labs  06/04/15 0505 06/05/15 1503  NA 135  --   K 3.6 4.5  CL 94*  --   CO2 28  --   GLUCOSE 255*  --   BUN 18  --   CREATININE 3.46*  --   CALCIUM 8.5*  --    Hepatic Function Latest Ref Rng 06/03/2015 06/02/2015 05/29/2015  Total Protein 6.5 - 8.1 g/dL 5.7(L) - -  Albumin 3.5 - 5.0 g/dL 1.9(L) 1.8(L) 2.1(L)  AST 15 - 41 U/L 54(H) - -  ALT 17 - 63 U/L 57 - -  Alk Phosphatase 38 - 126 U/L 443(H) - -  Total Bilirubin 0.3 - 1.2 mg/dL 1.8(H) - -  Bilirubin, Direct 0.1 - 0.5 mg/dL - - -   Lab Results  Component Value Date   TSH 0.583 05/26/2015    Assessment/Plan  Active Problems:   CAD (coronary artery disease)   Non-STEMI (non-ST elevated myocardial infarction) (HCC)   Coronary artery disease involving native coronary artery of native heart with unstable angina pectoris (HCC)   Cardiomyopathy, ischemic   Anemia of chronic disease   Chronic pain   Ventricular bigeminy   1. NSTEMI: day 7 s/p CABG x 3. (LIMA to LAD, SVG to CIRCUMFLEX, and SVG to PDA) with EVH from right thigh greater saphenous vein and  left internal mammary artery  harvest. Denies CP and dyspnea.   2. Ventricular Bigeminy: resolved with increase of Coreg to 6.25 mg BID and addition of amiodarone. Maintaining NSR. HR controlled in the 70s.   3. ESRD: on HD. Nephrology following.   4. Ischemic Cardiomyopathy: EF 30-35%. Continue volume control through HD. Continue BB therapy with Coreg. No ACE/ARB given ESRD.      LOS: 11 days    Brittainy M. Ladoris Gene 06/06/2015 1:06 PM  Patient seen and examined. Agree with assessment and plan. Feels much better. Back from dialysis. Bigeminy resolved; No AF. Tolerating increased coreg and amiodarone. Will f/u LFT's in am. TSH normal   Troy Sine, MD, [facility] 06/06/2015 1:45 PM

## 2015-06-06 NOTE — Procedures (Signed)
3.5 hour hemodialysis treatment completed via L forearm AVF without difficulty. Patient tolerated treatment well. 1.5kg removed during treatment. Report called to Baxter HireKristen, RN and [facility].  Thayer OhmJoy L. Valecia Beske, RN, BSN 06/06/2015 [phone]

## 2015-06-06 NOTE — Care Management Note (Signed)
Case Management Note CM note started by [name] RNCM   Patient Details  Name: Daniel Anthony MRN: [id number] Date of Birth: [birth year]  Subjective/Objective:    Pt admitted with NON STEMI, CABG scheduled for 05/30/15                Action/Plan:  Pt is from home with wife.  Per pt; he has been suffering from neuropathy for over 14 years and is not independently ambulating at home.  Pt states he uses wheelchair outside of home but is able to manage with cane inside home.  Post surgery Cardiac Rehab will work with pt and provide recommendations for discharge needs.  CM will continue to monitor.   Expected Discharge Date:    06/06/15              Expected Discharge Plan:  Home w Home Health Services  In-House Referral:     Discharge planning Services  CM Consult  Post Acute Care Choice:  Durable Medical Equipment, Home Health Choice offered to:  Patient, Spouse  DME Arranged:  3-N-1 DME Agency:  [facility]  HH Arranged:  [name] Agency:  [facility]  Status of Service:  Completed, signed off  Medicare Important Message Given:  Yes-fourth notification given Date Medicare IM Given:    Medicare IM give by:    Date Additional Medicare IM Given:    Additional Medicare Important Message give by:     If discussed at Long Length of Stay Meetings, dates discussed:  06/05/15  Additional Comments:  06/06/15- [name] RNCM- f/u done with pt and wife [facility] per [facility] and wife- [facility]- orders have been placed -[facility]- [name]- 3n1 to be delivered to room prior to discharge.   06/05/15- CIR consulted for possible admission- however per [name] with CIR - insurance has denied admission to CIR- spoke with pt and wife at bedside regarding insurance denial for CIR and plans for  discharge- discussed home with HH vs STSNF which pt would need to be set up with outpt HD prior to SNF - per pt he would prefer to return home and continue to do home HD with wife support- states that he has a lift chair at home along with cane and walker, states that he would like a 3n1 for home- reports that they would have friend/family that could assist them and offer supervision when wife not there. Wife agreeable to plan to return home- [facility] [place]- per pt/wife would like to think [facility] will f/u for choice- notified Randall Hissonnielle Zimmerman PA of CIR denial and plan [facility]- will need order for HH-PT/OT with F2F and DME 3n1.   Darrold SpanWebster, [name], RN 06/06/2015, 3:11 PM

## 2015-06-06 NOTE — Progress Notes (Signed)
7 Days Post-Op Procedure(s) (LRB):  CORONARY ARTERY BYPASS GRAFTING (CABG) x 3 (LIMA to LAD, SVG to CIRCUMFLEX, and SVG to PDA) with EVH from right thigh greater saphenous vein and left internal mammary artery harvest (N/A) TRANSESOPHAGEAL ECHOCARDIOGRAM (TEE) (N/A) Subjective: CABG EF .30 chronic HD  CIDP- not ambulatory nsr with improved ectopy  Incisions clean Min edema  Objective: Vital signs in last 24 hours: Temp:  [98 F (36.7 C)-98.9 F (37.2 C)] 98 F (36.7 C) (10/21 0645) Pulse Rate:  [67-80] 67 (10/21 0645) Cardiac Rhythm:  [-] Normal sinus rhythm (10/21 0701) Resp:  [16-20] 20 (10/21 0645) BP: (113-125)/(42-71) 120/71 mmHg (10/21 0645) SpO2:  [99 %-100 %] 99 % (10/21 0645) Weight:  [183 lb 6.8 oz (83.2 kg)] 183 lb 6.8 oz (83.2 kg) (10/21 0645)  Hemodynamic parameters for last 24 hours:  stable  Intake/Output from previous day: 10/20 0701 - 10/21 0700 In: 360 [P.O.:360] Out: -  Intake/Output this shift:      Lab Results:  Recent Labs  06/04/15 0505  WBC 7.3  HGB 10.1*  HCT 31.8*  PLT 153   BMET:  Recent Labs  06/04/15 0505 06/05/15 1503  NA 135  --   K 3.6 4.5  CL 94*  --   CO2 28  --   GLUCOSE 255*  --   BUN 18  --   CREATININE 3.46*  --   CALCIUM 8.5*  --     PT/INR: No results for input(s): LABPROT, INR in the last 72 hours. ABG    Component Value Date/Time   PHART 7.330* 05/30/2015 1947   HCO3 24.1* 05/30/2015 1947   TCO2 20 05/31/2015 1728   ACIDBASEDEF 2.0 05/30/2015 1947   O2SAT 98.0 05/30/2015 1947   CBG (last 3)   Recent Labs  06/05/15 1623 06/05/15 2041 06/06/15 0642  GLUCAP 224* 189* 120*    Assessment/Plan: S/P Procedure(s) (LRB):  CORONARY ARTERY BYPASS GRAFTING (CABG) x 3 (LIMA to LAD, SVG to CIRCUMFLEX, and SVG to PDA) with EVH from right thigh greater saphenous vein and left internal mammary artery harvest (N/A) TRANSESOPHAGEAL ECHOCARDIOGRAM (TEE) (N/A) Mobilize Diabetes control will need home PT,  RN Needs to be stronger begore DC home  LOS: 11 days    Daniel Anthony 06/06/2015

## 2015-06-07 ENCOUNTER — Inpatient Hospital Stay (HOSPITAL_COMMUNITY): Payer: Medicare Other

## 2015-06-07 DIAGNOSIS — R7989 Other specified abnormal findings of blood chemistry: Secondary | ICD-10-CM

## 2015-06-07 LAB — CBC
HCT: 33.2 % — ABNORMAL LOW (ref 39.0–52.0)
Hemoglobin: 10.6 g/dL — ABNORMAL LOW (ref 13.0–17.0)
MCH: 30.9 pg (ref 26.0–34.0)
MCHC: 31.9 g/dL (ref 30.0–36.0)
MCV: 96.8 fL (ref 78.0–100.0)
Platelets: 218 10*3/uL (ref 150–400)
RBC: 3.43 MIL/uL — ABNORMAL LOW (ref 4.22–5.81)
RDW: 17.6 % — ABNORMAL HIGH (ref 11.5–15.5)
WBC: 8.1 10*3/uL (ref 4.0–10.5)

## 2015-06-07 LAB — GLUCOSE, CAPILLARY
GLUCOSE-CAPILLARY: 63 mg/dL — AB (ref 65–99)
GLUCOSE-CAPILLARY: 130 mg/dL — AB (ref 65–99)
GLUCOSE-CAPILLARY: 125 mg/dL — AB (ref 65–99)
GLUCOSE-CAPILLARY: 98 mg/dL (ref 65–99)
Glucose-Capillary: 175 mg/dL — ABNORMAL HIGH (ref 65–99)

## 2015-06-07 LAB — HEPATIC FUNCTION PANEL
ALBUMIN: 2 g/dL — AB (ref 3.5–5.0)
ALK PHOS: 1252 U/L — AB (ref 38–126)
ALT: 170 U/L — ABNORMAL HIGH (ref 17–63)
AST: 200 U/L — ABNORMAL HIGH (ref 15–41)
BILIRUBIN INDIRECT: 0.8 mg/dL (ref 0.3–0.9)
Bilirubin, Direct: 0.9 mg/dL — ABNORMAL HIGH (ref 0.1–0.5)
TOTAL PROTEIN: 6.2 g/dL — AB (ref 6.5–8.1)
Total Bilirubin: 1.7 mg/dL — ABNORMAL HIGH (ref 0.3–1.2)

## 2015-06-07 LAB — BASIC METABOLIC PANEL
Anion gap: 10 (ref 5–15)
BUN: 20 mg/dL (ref 6–20)
CO2: 28 mmol/L (ref 22–32)
Calcium: 7.8 mg/dL — ABNORMAL LOW (ref 8.9–10.3)
Chloride: 93 mmol/L — ABNORMAL LOW (ref 101–111)
Creatinine, Ser: 4.01 mg/dL — ABNORMAL HIGH (ref 0.61–1.24)
GFR calc Af Amer: 17 mL/min — ABNORMAL LOW (ref >60)
GFR calc non Af Amer: 14 mL/min — ABNORMAL LOW (ref >60)
Glucose, Bld: 81 mg/dL (ref 65–99)
Potassium: 4.5 mmol/L (ref 3.5–5.1)
Sodium: 131 mmol/L — ABNORMAL LOW (ref 135–145)

## 2015-06-07 MED ORDER — ASPIRIN EC 81 MG PO TBEC
81.0000 mg | DELAYED_RELEASE_TABLET | Freq: Every day | ORAL | Status: DC
  Administered 2015-06-08 – 2015-06-09 (×2): 81 mg via ORAL
  Filled 2015-06-07 (×2): qty 1

## 2015-06-07 NOTE — Progress Notes (Addendum)
[address]       [place] [address]             [phone]      8 Days Post-Op Procedure(s) (LRB):  CORONARY ARTERY BYPASS GRAFTING (CABG) x 3 (LIMA to LAD, SVG to CIRCUMFLEX, and SVG to PDA) with EVH from right thigh greater saphenous vein and left internal mammary artery harvest (N/A) TRANSESOPHAGEAL ECHOCARDIOGRAM (TEE) (N/A) Subjective: Feels well   Objective: Vital signs in last 24 hours: Temp:  [97.4 F (36.3 C)-98.6 F (37 C)] 97.7 F (36.5 C) (10/22 0431) Pulse Rate:  [64-75] 71 (10/22 0431) Cardiac Rhythm:  [-] Normal sinus rhythm (10/22 0700) Resp:  [7-22] 18 (10/22 0431) BP: (91-121)/(52-65) 121/57 mmHg (10/22 0431) SpO2:  [96 %-98 %] 96 % (10/22 0431) Weight:  [170 lb 10.2 oz (77.4 kg)-179 lb 14.3 oz (81.6 kg)] 179 lb 14.3 oz (81.6 kg) (10/22 0431)  Hemodynamic parameters for last 24 hours:    Intake/Output from previous day: 10/21 0701 - 10/22 0700 In: 270 [P.O.:240; I.V.:30] Out: 1500  Intake/Output this shift:    General appearance: alert, cooperative and no distress Heart: regular rate and rhythm Lungs: dim in lower fields Abdomen: benign Extremities: no edema Wound: healing well  Lab Results:  Recent Labs  06/07/15 0416  WBC 8.1  HGB 10.6*  HCT 33.2*  PLT 218   BMET:  Recent Labs  06/05/15 1503 06/07/15 0416  NA  --  131*  K 4.5 4.5  CL  --  93*  CO2  --  28  GLUCOSE  --  81  BUN  --  20  CREATININE  --  4.01*  CALCIUM  --  7.8*    PT/INR: No results for input(s): LABPROT, INR in the last 72 hours. ABG    Component Value Date/Time   PHART 7.330* 05/30/2015 1947   HCO3 24.1* 05/30/2015 1947   TCO2 20 05/31/2015 1728   ACIDBASEDEF 2.0 05/30/2015 1947   O2SAT 98.0 05/30/2015 1947   CBG (last 3)   Recent Labs  06/06/15 1603 06/06/15 2110 06/07/15 0611  GLUCAP 122* 113* 63*    Meds Scheduled Meds: . amiodarone  200 mg Oral BID  . aspirin EC  325 mg Oral Daily   Or  . aspirin  324 mg Per  Tube Daily  . bisacodyl  10 mg Oral Daily   Or  . bisacodyl  10 mg Rectal Daily  . calcitRIOL  0.25 mcg Oral BID  . calcium acetate  667 mg Oral TID WC  . carvedilol  6.25 mg Oral BID WC  . chlorhexidine  15 mL Mouth/Throat BID  . cinacalcet  30 mg Oral QHS  . [START ON 06/11/2015] darbepoetin (ARANESP) injection - DIALYSIS  200 mcg Intravenous Q Wed-HD  . docusate sodium  200 mg Oral Daily  . escitalopram  20 mg Oral QHS  . fentaNYL  100 mcg Transdermal Q72H  . insulin aspart  0-9 Units Subcutaneous TID WC  . insulin detemir  10 Units Subcutaneous BID  . multivitamin  1 tablet Oral QHS  . pantoprazole  40 mg Oral Daily  . polyethylene glycol  8.5-17 g Oral QHS  . rosuvastatin  40 mg Oral QHS  . sodium chloride  3 mL Intravenous Q12H  . tamsulosin  0.4 mg Oral QHS   Continuous Infusions:  PRN Meds:.HYDROmorphone, Influenza vac split quadrivalent PF, LORazepam, menthol-cetylpyridinium, metoprolol, ondansetron (ZOFRAN) IV, phenol, sodium chloride, sodium chloride  Xrays Dg Chest 2 View  06/07/2015  CLINICAL DATA:  Shortness of breath. EXAM: CHEST  2 VIEW COMPARISON:  06/03/2015 FINDINGS: Sequelae of prior CABG are again identified. Left subclavian central venous catheter is unchanged with tip near the cavoatrial junction. Thoracic aortic calcification is again seen. Cardiac silhouette remains enlarged. The lungs remain hypoinflated. There is minimal right basilar opacity, similar to slightly improved from the prior study. Heterogeneous left basilar opacities have improved. Small bilateral pleural effusions remain. There is no overt pulmonary edema. No pneumothorax is seen. IMPRESSION: Persistent small bilateral pleural effusions. Mildly improved aeration of the left lung base. Electronically Signed   By: Sebastian AcheAllen  Grady M.D.   On: 06/07/2015 07:23    Assessment/Plan: S/P Procedure(s) (LRB):  CORONARY ARTERY BYPASS GRAFTING (CABG) x 3 (LIMA to LAD, SVG to CIRCUMFLEX, and SVG to PDA) with  EVH from right thigh greater saphenous vein and left internal mammary artery harvest (N/A) TRANSESOPHAGEAL ECHOCARDIOGRAM (TEE) (N/A)  1 doing well overall but with rising LFT's will stop amio- cardiol to evaluate as well    LOS: 12 days    Daniel Anthony,Daniel Anthony 06/07/2015   Stopping statin and  amino I have seen and examined Daniel Anthony and agree with the above assessment  and plan.  Delight OvensEdward B Rolando Hessling MD Beeper [phone] Office [phone] 06/07/2015 1:18 PM

## 2015-06-07 NOTE — Progress Notes (Signed)
Chest tube sutures removed.  Faustino CongressLisa Manju Kulkarni, RN

## 2015-06-07 NOTE — Progress Notes (Signed)
[facility] Progress Note   Subjective: remains in NSR, AST/ALT and alk phos up yest.  Amio stopped. On coreg for vent bigeminy.   Filed Vitals:   06/06/15 1727 06/06/15 1925 06/07/15 0431 06/07/15 0846  BP: 105/65 105/58 121/57 125/62  Pulse: 74 68 71 73  Temp:  98.1 F (36.7 C) 97.7 F (36.5 C)   TempSrc:  Oral Oral   Resp:  18 18   Height:      Weight:   81.6 kg (179 lb 14.3 oz)   SpO2:  98% 96%    Exam: Alert, no distress Chest clear ant / lat RRR no rub Abd soft / ntnd 1+ LE edema Neuro alert nf LFA AVF +bruit Neuro is ox 3, gen weak   Home HD / K Machine - MWF 4h  79kg  2/2.25 bath  LUE AVF   Calc 0.25 ug daily Mircera yes?  Last P 6.2, pth 549, tsat 53, ferr 935      Assessment: 1 ESRD on home HD, cont MWF here 2 NSTEMI - s/p CABG 10/14, stable 3 Vol up 2-3 kg, stable and asymptomatic 4 Vent bigeminy - on coreg, amio dc'd due to ^LFT's 5 Anemia cont esa 6 MBD cont meds 7 HTN good bp control 8 Chronic polyneuropathy/ debility 9 Dispo - home when stable from cardiac standpoint  Plan - as above   Kelly Splinter MD [place] pager [phone]    cell [phone] 06/07/2015, 11:45 AM    Recent Labs Lab 06/02/15 0600 06/03/15 0745 06/04/15 0505 06/05/15 1503 06/07/15 0416  NA 132* 131* 135  --  131*  K 3.5 4.3 3.6 4.5 4.5  CL 94* 91* 94*  --  93*  CO2 $Re'27 28 28  'VeO$ --  28  GLUCOSE 54* 217* 255*  --  81  BUN $Re'19 19 18  'Baz$ --  20  CREATININE 3.70* 3.92* 3.46*  --  4.01*  CALCIUM 7.8* 8.4* 8.5*  --  7.8*  PHOS 4.6  --   --   --   --     Recent Labs Lab 06/02/15 0600 06/03/15 0745 06/07/15 0416  AST  --  54* 200*  ALT  --  57 170*  ALKPHOS  --  443* 1252*  BILITOT  --  1.8* 1.7*  PROT  --  5.7* 6.2*  ALBUMIN 1.8* 1.9* 2.0*    Recent Labs Lab 06/03/15 0745 06/04/15 0505 06/07/15 0416  WBC 9.0 7.3 8.1  HGB 10.8* 10.1* 10.6*  HCT 33.2* 31.8* 33.2*  MCV 95.1 94.9 96.8  PLT 152 153 218   . aspirin EC  81 mg  Oral Daily  . bisacodyl  10 mg Oral Daily   Or  . bisacodyl  10 mg Rectal Daily  . calcitRIOL  0.25 mcg Oral BID  . calcium acetate  667 mg Oral TID WC  . carvedilol  6.25 mg Oral BID WC  . chlorhexidine  15 mL Mouth/Throat BID  . cinacalcet  30 mg Oral QHS  . [START ON 06/11/2015] darbepoetin (ARANESP) injection - DIALYSIS  200 mcg Intravenous Q Wed-HD  . docusate sodium  200 mg Oral Daily  . escitalopram  20 mg Oral QHS  . fentaNYL  100 mcg Transdermal Q72H  . insulin aspart  0-9 Units Subcutaneous TID WC  . insulin detemir  10 Units Subcutaneous BID  . multivitamin  1 tablet Oral QHS  . pantoprazole  40 mg Oral Daily  . polyethylene glycol  8.5-17 g Oral QHS  . sodium chloride  3 mL Intravenous Q12H  . tamsulosin  0.4 mg Oral QHS     HYDROmorphone, Influenza vac split quadrivalent PF, LORazepam, menthol-cetylpyridinium, metoprolol, ondansetron (ZOFRAN) IV, phenol, sodium chloride, sodium chloride

## 2015-06-07 NOTE — Progress Notes (Signed)
Patient Profile: 64 y/o male, admitted for NSTEMI. Required CABG. Now day 7 post-op.   Subjective: Feels significantly better now that he is back in NSR. Denies CP or dyspnea.   Objective: Vital signs in last 24 hours: Temp:  [97.4 F (36.3 C)-98.6 F (37 C)] 97.7 F (36.5 C) (10/22 0431) Pulse Rate:  [68-75] 73 (10/22 0846) Resp:  [12-18] 18 (10/22 0431) BP: (105-125)/(52-65) 125/62 mmHg (10/22 0846) SpO2:  [96 %-98 %] 96 % (10/22 0431) Weight:  [170 lb 10.2 oz (77.4 kg)-179 lb 14.3 oz (81.6 kg)] 179 lb 14.3 oz (81.6 kg) (10/22 0431) Last BM Date: 06/04/15  Intake/Output from previous day: 10/21 0701 - 10/22 0700 In: 270 [P.O.:240; I.V.:30] Out: 1500    I/O since admission: -5131  MEDICATIONS:   . aspirin EC  325 mg Oral Daily   Or  . aspirin  324 mg Per Tube Daily  . bisacodyl  10 mg Oral Daily   Or  . bisacodyl  10 mg Rectal Daily  . calcitRIOL  0.25 mcg Oral BID  . calcium acetate  667 mg Oral TID WC  . carvedilol  6.25 mg Oral BID WC  . chlorhexidine  15 mL Mouth/Throat BID  . cinacalcet  30 mg Oral QHS  . [START ON 06/11/2015] darbepoetin (ARANESP) injection - DIALYSIS  200 mcg Intravenous Q Wed-HD  . docusate sodium  200 mg Oral Daily  . escitalopram  20 mg Oral QHS  . fentaNYL  100 mcg Transdermal Q72H  . insulin aspart  0-9 Units Subcutaneous TID WC  . insulin detemir  10 Units Subcutaneous BID  . multivitamin  1 tablet Oral QHS  . pantoprazole  40 mg Oral Daily  . polyethylene glycol  8.5-17 g Oral QHS  . rosuvastatin  40 mg Oral QHS  . sodium chloride  3 mL Intravenous Q12H  . tamsulosin  0.4 mg Oral QHS      PE: General appearance: alert, cooperative and no distress Neck: no carotid bruit and no JVD Lungs: faint crackles at the bases Heart: regular rate and rhythm Extremities: trace- 1+ pitting ankle edema R>L Pulses: 2+ and symmetric Skin: warm and dry Neurologic: Grossly normal  Lab Results:   BMET  Recent Labs  06/05/15 1503  06/07/15 0416  NA  --  131*  K 4.5 4.5  CL  --  93*  CO2  --  28  GLUCOSE  --  81  BUN  --  20  CREATININE  --  4.01*  CALCIUM  --  7.8*   CBC Latest Ref Rng 06/07/2015 06/04/2015 06/03/2015  WBC 4.0 - 10.5 K/uL 8.1 7.3 9.0  Hemoglobin 13.0 - 17.0 g/dL 10.6(L) 10.1(L) 10.8(L)  Hematocrit 39.0 - 52.0 % 33.2(L) 31.8(L) 33.2(L)  Platelets 150 - 400 K/uL 218 153 152   Lab Results  Component Value Date   MCV 96.8 06/07/2015   MCV 94.9 06/04/2015   MCV 95.1 06/03/2015    Hepatic Function Latest Ref Rng 06/07/2015 06/03/2015 06/02/2015  Total Protein 6.5 - 8.1 g/dL 6.2(L) 5.7(L) -  Albumin 3.5 - 5.0 g/dL 2.0(L) 1.9(L) 1.8(L)  AST 15 - 41 U/L 200(H) 54(H) -  ALT 17 - 63 U/L 170(H) 57 -  Alk Phosphatase 38 - 126 U/L 1252(H) 443(H) -  Total Bilirubin 0.3 - 1.2 mg/dL 1.7(H) 1.8(H) -  Bilirubin, Direct 0.1 - 0.5 mg/dL 0.9(H) - -   Lab Results  Component Value Date   TSH 0.583 05/26/2015  Lipid Panel  No results found for: CHOL, TRIG, HDL, CHOLHDL, VLDL, LDLCALC, LDLDIRECT  Assessment/Plan  Active Problems:   CAD (coronary artery disease)   Non-STEMI (non-ST elevated myocardial infarction) (HCC)   Coronary artery disease involving native coronary artery of native heart with unstable angina pectoris (HCC)   Cardiomyopathy, ischemic   Anemia of chronic disease   Chronic pain   Ventricular bigeminy   1. NSTEMI: day 7 s/p CABG x 3. (LIMA to LAD, SVG to CIRCUMFLEX, and SVG to PDA) with EVH from right thigh greater saphenous vein and left internal mammary artery harvest. Denies CP and dyspnea.  Change ASA to 81 mg.   2. Ventricular Bigeminy: resolved with increase of Coreg to 6.25 mg BID and addition of amiodarone. Maintaining NSR. HR controlled in the 60s.  To DC amio today with increased LFT's and may need to further increase coreg tomorrow.  3. ESRD: on HD. Nephrology following.   4. Ischemic Cardiomyopathy: EF 30-35%. Continue volume control through HD. Continue BB therapy  with Coreg. No ACE/ARB given ESRD.   5. Abnormal LFT's:  Markedly increased today; need to dc amiodarone and statin. Apparently by history he has had transient LFT elevation in the past with statin therapy.   Troy Sine, MD, [facility] 06/07/2015 10:37 AM

## 2015-06-08 DIAGNOSIS — R748 Abnormal levels of other serum enzymes: Secondary | ICD-10-CM

## 2015-06-08 LAB — CBC
HCT: 32.4 % — ABNORMAL LOW (ref 39.0–52.0)
Hemoglobin: 10.4 g/dL — ABNORMAL LOW (ref 13.0–17.0)
MCH: 30.8 pg (ref 26.0–34.0)
MCHC: 32.1 g/dL (ref 30.0–36.0)
MCV: 95.9 fL (ref 78.0–100.0)
Platelets: 251 10*3/uL (ref 150–400)
RBC: 3.38 MIL/uL — ABNORMAL LOW (ref 4.22–5.81)
RDW: 17.6 % — ABNORMAL HIGH (ref 11.5–15.5)
WBC: 8.3 10*3/uL (ref 4.0–10.5)

## 2015-06-08 LAB — COMPREHENSIVE METABOLIC PANEL
ALT: 176 U/L — ABNORMAL HIGH (ref 17–63)
AST: 180 U/L — ABNORMAL HIGH (ref 15–41)
Albumin: 2 g/dL — ABNORMAL LOW (ref 3.5–5.0)
Alkaline Phosphatase: 1230 U/L — ABNORMAL HIGH (ref 38–126)
Anion gap: 9 (ref 5–15)
BUN: 29 mg/dL — ABNORMAL HIGH (ref 6–20)
CO2: 27 mmol/L (ref 22–32)
Calcium: 7.8 mg/dL — ABNORMAL LOW (ref 8.9–10.3)
Chloride: 94 mmol/L — ABNORMAL LOW (ref 101–111)
Creatinine, Ser: 5.26 mg/dL — ABNORMAL HIGH (ref 0.61–1.24)
GFR calc Af Amer: 12 mL/min — ABNORMAL LOW (ref >60)
GFR calc non Af Amer: 10 mL/min — ABNORMAL LOW (ref >60)
Glucose, Bld: 144 mg/dL — ABNORMAL HIGH (ref 65–99)
Potassium: 5.2 mmol/L — ABNORMAL HIGH (ref 3.5–5.1)
Sodium: 130 mmol/L — ABNORMAL LOW (ref 135–145)
Total Bilirubin: 1.7 mg/dL — ABNORMAL HIGH (ref 0.3–1.2)
Total Protein: 5.8 g/dL — ABNORMAL LOW (ref 6.5–8.1)

## 2015-06-08 LAB — GLUCOSE, CAPILLARY
GLUCOSE-CAPILLARY: 163 mg/dL — AB (ref 65–99)
GLUCOSE-CAPILLARY: 146 mg/dL — AB (ref 65–99)
GLUCOSE-CAPILLARY: 66 mg/dL (ref 65–99)
GLUCOSE-CAPILLARY: 156 mg/dL — AB (ref 65–99)
Glucose-Capillary: 110 mg/dL — ABNORMAL HIGH (ref 65–99)
Glucose-Capillary: 83 mg/dL (ref 65–99)

## 2015-06-08 NOTE — Progress Notes (Signed)
[facility] Progress Note   Subjective: no complaints  Filed Vitals:   06/07/15 1814 06/07/15 2040 06/08/15 0520 06/08/15 0807  BP: 111/54 106/58 109/55 113/58  Pulse: 63 62 64 65  Temp:  98.1 F (36.7 C) 97.6 F (36.4 C)   TempSrc:  Oral Oral   Resp:  16 16   Height:      Weight:   81.1 kg (178 lb 12.7 oz)   SpO2:  98% 98%    Exam: Alert, no distress Chest clear ant / lat RRR no rub Abd soft / ntnd 1+ LE edema Neuro alert nf LFA AVF +bruit Neuro is ox 3, gen weak   Home HD / K Machine - MWF 4h  79kg  2/2.25 bath  LUE AVF   Calc 0.25 ug daily Mircera yes?  Last P 6.2, pth 549, tsat 53, ferr 935      Assessment: 1 ESRD MWF dialysis 2 NSTEMI - s/p CABG 10/14, stable 3 Vol up 2 kg 4 Vent bigeminy - on coreg, amio dc'd due to ^LFT's 5 Anemia cont esa 6 MBD cont meds 7 HTN good bp control 8 Chronic polyneuropathy/ debility 9 Dispo - per primary  Plan - HD in am   Vinson Moselleob Leonor Darnell MD [facility] pager [phone]    cell [phone] 06/08/2015, 12:21 PM    Recent Labs Lab 06/02/15 0600  06/04/15 0505 06/05/15 1503 06/07/15 0416 06/08/15 0437  NA 132*  < > 135  --  131* 130*  K 3.5  < > 3.6 4.5 4.5 5.2*  CL 94*  < > 94*  --  93* 94*  CO2 27  < > 28  --  28 27  GLUCOSE 54*  < > 255*  --  81 144*  BUN 19  < > 18  --  20 29*  CREATININE 3.70*  < > 3.46*  --  4.01* 5.26*  CALCIUM 7.8*  < > 8.5*  --  7.8* 7.8*  PHOS 4.6  --   --   --   --   --   < > = values in this interval not displayed.  Recent Labs Lab 06/03/15 0745 06/07/15 0416 06/08/15 0437  AST 54* 200* 180*  ALT 57 170* 176*  ALKPHOS 443* 1252* 1230*  BILITOT 1.8* 1.7* 1.7*  PROT 5.7* 6.2* 5.8*  ALBUMIN 1.9* 2.0* 2.0*    Recent Labs Lab 06/04/15 0505 06/07/15 0416 06/08/15 0437  WBC 7.3 8.1 8.3  HGB 10.1* 10.6* 10.4*  HCT 31.8* 33.2* 32.4*  MCV 94.9 96.8 95.9  PLT 153 218 251   . aspirin EC  81 mg Oral Daily  . bisacodyl  10 mg Oral Daily   Or  .  bisacodyl  10 mg Rectal Daily  . calcitRIOL  0.25 mcg Oral BID  . calcium acetate  667 mg Oral TID WC  . carvedilol  6.25 mg Oral BID WC  . chlorhexidine  15 mL Mouth/Throat BID  . cinacalcet  30 mg Oral QHS  . [START ON 06/11/2015] darbepoetin (ARANESP) injection - DIALYSIS  200 mcg Intravenous Q Wed-HD  . docusate sodium  200 mg Oral Daily  . escitalopram  20 mg Oral QHS  . fentaNYL  100 mcg Transdermal Q72H  . insulin aspart  0-9 Units Subcutaneous TID WC  . insulin detemir  10 Units Subcutaneous BID  . multivitamin  1 tablet Oral QHS  . pantoprazole  40 mg Oral Daily  . polyethylene glycol  8.5-17 g Oral QHS  . sodium chloride  3 mL Intravenous Q12H  . tamsulosin  0.4 mg Oral QHS     HYDROmorphone, Influenza vac split quadrivalent PF, LORazepam, menthol-cetylpyridinium, metoprolol, ondansetron (ZOFRAN) IV, phenol, sodium chloride, sodium chloride

## 2015-06-08 NOTE — Progress Notes (Signed)
Patient Profile: 64 y/o male, admitted for NSTEMI. Required CABG. Now day 8 post-op.   Subjective: Feels significantly better now that he is back in NSR. Denies CP or dyspnea.   Objective: Vital signs in last 24 hours: Temp:  [97.6 F (36.4 C)-98.1 F (36.7 C)] 97.6 F (36.4 C) (10/23 0520) Pulse Rate:  [62-65] 65 (10/23 0807) Resp:  [16-17] 16 (10/23 0520) BP: (97-113)/(54-81) 113/58 mmHg (10/23 0807) SpO2:  [98 %] 98 % (10/23 0520) Weight:  [178 lb 12.7 oz (81.1 kg)] 178 lb 12.7 oz (81.1 kg) (10/23 0520) Last BM Date: 06/07/15  Intake/Output from previous day:     I/O since admission: -5131  MEDICATIONS:   . aspirin EC  81 mg Oral Daily  . bisacodyl  10 mg Oral Daily   Or  . bisacodyl  10 mg Rectal Daily  . calcitRIOL  0.25 mcg Oral BID  . calcium acetate  667 mg Oral TID WC  . carvedilol  6.25 mg Oral BID WC  . chlorhexidine  15 mL Mouth/Throat BID  . cinacalcet  30 mg Oral QHS  . [START ON 06/11/2015] darbepoetin (ARANESP) injection - DIALYSIS  200 mcg Intravenous Q Wed-HD  . docusate sodium  200 mg Oral Daily  . escitalopram  20 mg Oral QHS  . fentaNYL  100 mcg Transdermal Q72H  . insulin aspart  0-9 Units Subcutaneous TID WC  . insulin detemir  10 Units Subcutaneous BID  . multivitamin  1 tablet Oral QHS  . pantoprazole  40 mg Oral Daily  . polyethylene glycol  8.5-17 g Oral QHS  . sodium chloride  3 mL Intravenous Q12H  . tamsulosin  0.4 mg Oral QHS      PE: General appearance: alert, cooperative and no distress Neck: no carotid bruit and no JVD Lungs: faint crackles at the bases Heart: regular rate and rhythm Extremities: trace- 1+ pitting ankle edema R>L Pulses: 2+ and symmetric Skin: warm and dry Neurologic: Grossly normal  Lab Results:   BMET  Recent Labs  06/05/15 1503 06/07/15 0416 06/08/15 0437  NA  --  131* 130*  K 4.5 4.5 5.2*  CL  --  93* 94*  CO2  --  28 27  GLUCOSE  --  81 144*  BUN  --  20 29*  CREATININE  --  4.01*  5.26*  CALCIUM  --  7.8* 7.8*   CBC Latest Ref Rng 06/08/2015 06/07/2015 06/04/2015  WBC 4.0 - 10.5 K/uL 8.3 8.1 7.3  Hemoglobin 13.0 - 17.0 g/dL 10.4(L) 10.6(L) 10.1(L)  Hematocrit 39.0 - 52.0 % 32.4(L) 33.2(L) 31.8(L)  Platelets 150 - 400 K/uL 251 218 153   Lab Results  Component Value Date   MCV 95.9 06/08/2015   MCV 96.8 06/07/2015   MCV 94.9 06/04/2015    Hepatic Function Latest Ref Rng 06/08/2015 06/07/2015 06/03/2015  Total Protein 6.5 - 8.1 g/dL 5.8(L) 6.2(L) 5.7(L)  Albumin 3.5 - 5.0 g/dL 2.0(L) 2.0(L) 1.9(L)  AST 15 - 41 U/L 180(H) 200(H) 54(H)  ALT 17 - 63 U/L 176(H) 170(H) 57  Alk Phosphatase 38 - 126 U/L 1230(H) 1252(H) 443(H)  Total Bilirubin 0.3 - 1.2 mg/dL 1.7(H) 1.7(H) 1.8(H)  Bilirubin, Direct 0.1 - 0.5 mg/dL - 0.9(H) -   Lab Results  Component Value Date   TSH 0.583 05/26/2015   Lipid Panel  No results found for: CHOL, TRIG, HDL, CHOLHDL, VLDL, LDLCALC, LDLDIRECT  Assessment/Plan  Active Problems:   CAD (coronary artery disease)  Non-STEMI (non-ST elevated myocardial infarction) ([place])   Coronary artery disease involving native coronary artery of native heart with unstable angina pectoris (HCC)   Cardiomyopathy, ischemic   Anemia of chronic disease   Chronic pain   Ventricular bigeminy   1. NSTEMI: day 8 s/p CABG x 3. (LIMA to LAD, SVG to CIRCUMFLEX, and SVG to PDA) with EVH from right thigh greater saphenous vein and left internal mammary artery harvest. Denies CP and dyspnea.  Change ASA to 81 mg.   2. Ventricular Bigeminy: resolved with increase of Coreg to 6.25 mg BID and addition of amiodarone. Maintaining NSR. HR controlled in the upper 50- - mid 60's without further ectopy. Will not increase coreg presently  3. ESRD: on HD. Nephrology following.   4. Ischemic Cardiomyopathy: EF 30-35%. Continue volume control through HD. Continue BB therapy with Coreg. No ACE/ARB given ESRD.   5. Abnormal LFT's:  Markedly increased, but no further increase  from yesterday; off amiodarone and statin. By history he has had transient LFT elevation in the past with statin therapy.   Plan for observation tonight with f/u LFT's in am and ? Dc tomorrow post dialysis.  Troy Sine, MD, [facility] 06/08/2015 11:22 AM

## 2015-06-08 NOTE — Progress Notes (Addendum)
[address]       [place] [id number]             [phone]      9 Days Post-Op Procedure(s) (LRB):  CORONARY ARTERY BYPASS GRAFTING (CABG) x 3 (LIMA to LAD, SVG to CIRCUMFLEX, and SVG to PDA) with EVH from right thigh greater saphenous vein and left internal mammary artery harvest (N/A) TRANSESOPHAGEAL ECHOCARDIOGRAM (TEE) (N/A) Subjective: Remains in sinus with amio discontinued, LFT's slightly improved  Objective: Vital signs in last 24 hours: Temp:  [97.6 F (36.4 C)-98.1 F (36.7 C)] 97.6 F (36.4 C) (10/23 0520) Pulse Rate:  [62-65] 65 (10/23 0807) Cardiac Rhythm:  [-] Normal sinus rhythm (10/23 0700) Resp:  [16-17] 16 (10/23 0520) BP: (97-113)/(54-81) 113/58 mmHg (10/23 0807) SpO2:  [98 %] 98 % (10/23 0520) Weight:  [178 lb 12.7 oz (81.1 kg)] 178 lb 12.7 oz (81.1 kg) (10/23 0520)  Hemodynamic parameters for last 24 hours:    Intake/Output from previous day:   Intake/Output this shift:    General appearance: alert, cooperative and no distress Heart: regular rate and rhythm Lungs: dim in left base>right base Abdomen: benign Extremities: no edema Wound: incis healing well  Lab Results:  Recent Labs  06/07/15 0416 06/08/15 0437  WBC 8.1 8.3  HGB 10.6* 10.4*  HCT 33.2* 32.4*  PLT 218 251   BMET:  Recent Labs  06/07/15 0416 06/08/15 0437  NA 131* 130*  K 4.5 5.2*  CL 93* 94*  CO2 28 27  GLUCOSE 81 144*  BUN 20 29*  CREATININE 4.01* 5.26*  CALCIUM 7.8* 7.8*    PT/INR: No results for input(s): LABPROT, INR in the last 72 hours. ABG    Component Value Date/Time   PHART 7.330* 05/30/2015 1947   HCO3 24.1* 05/30/2015 1947   TCO2 20 05/31/2015 1728   ACIDBASEDEF 2.0 05/30/2015 1947   O2SAT 98.0 05/30/2015 1947   CBG (last 3)   Recent Labs  06/07/15 1620 06/07/15 2111 06/08/15 0609  GLUCAP 98 175* 110*    Meds Scheduled Meds: . aspirin EC  81 mg Oral Daily  . bisacodyl  10 mg Oral Daily   Or  . bisacodyl   10 mg Rectal Daily  . calcitRIOL  0.25 mcg Oral BID  . calcium acetate  667 mg Oral TID WC  . carvedilol  6.25 mg Oral BID WC  . chlorhexidine  15 mL Mouth/Throat BID  . cinacalcet  30 mg Oral QHS  . [START ON 06/11/2015] darbepoetin (ARANESP) injection - DIALYSIS  200 mcg Intravenous Q Wed-HD  . docusate sodium  200 mg Oral Daily  . escitalopram  20 mg Oral QHS  . fentaNYL  100 mcg Transdermal Q72H  . insulin aspart  0-9 Units Subcutaneous TID WC  . insulin detemir  10 Units Subcutaneous BID  . multivitamin  1 tablet Oral QHS  . pantoprazole  40 mg Oral Daily  . polyethylene glycol  8.5-17 g Oral QHS  . sodium chloride  3 mL Intravenous Q12H  . tamsulosin  0.4 mg Oral QHS   Continuous Infusions:  PRN Meds:.HYDROmorphone, Influenza vac split quadrivalent PF, LORazepam, menthol-cetylpyridinium, metoprolol, ondansetron (ZOFRAN) IV, phenol, sodium chloride, sodium chloride  Xrays Dg Chest 2 View  06/07/2015  CLINICAL DATA:  Shortness of breath. EXAM: CHEST  2 VIEW COMPARISON:  06/03/2015 FINDINGS: Sequelae of prior CABG are again identified. Left subclavian central venous catheter is unchanged with tip near the cavoatrial junction.  Thoracic aortic calcification is again seen. Cardiac silhouette remains enlarged. The lungs remain hypoinflated. There is minimal right basilar opacity, similar to slightly improved from the prior study. Heterogeneous left basilar opacities have improved. Small bilateral pleural effusions remain. There is no overt pulmonary edema. No pneumothorax is seen. IMPRESSION: Persistent small bilateral pleural effusions. Mildly improved aeration of the left lung base. Electronically Signed   By: Sebastian Ache M.D.   On: 06/07/2015 07:23    Assessment/Plan: S/P Procedure(s) (LRB):  CORONARY ARTERY BYPASS GRAFTING (CABG) x 3 (LIMA to LAD, SVG to CIRCUMFLEX, and SVG to PDA) with EVH from right thigh greater saphenous vein and left internal mammary artery harvest  (N/A) TRANSESOPHAGEAL ECHOCARDIOGRAM (TEE) (N/A)  1 stable, ? Observation period required for rhythm since now off amio.  2 LFT's elevated but trending right direction 3 nephrology managing renal issues/dialysis    LOS: 13 days    Daniel Anthony 06/08/2015  chesk liver Anthony in am Poss home tomorrow after dialysis I have seen and examined Daniel Anthony and agree with the above assessment  and plan.  Delight Ovens MD [facility] [phone] Office [phone] 06/08/2015 12:42 PM

## 2015-06-09 ENCOUNTER — Telehealth: Payer: Self-pay

## 2015-06-09 LAB — HEPATIC FUNCTION PANEL
ALT: 167 U/L — AB (ref 17–63)
AST: 165 U/L — AB (ref 15–41)
Albumin: 2 g/dL — ABNORMAL LOW (ref 3.5–5.0)
Alkaline Phosphatase: 1174 U/L — ABNORMAL HIGH (ref 38–126)
BILIRUBIN DIRECT: 0.7 mg/dL — AB (ref 0.1–0.5)
BILIRUBIN TOTAL: 1.2 mg/dL (ref 0.3–1.2)
Indirect Bilirubin: 0.5 mg/dL (ref 0.3–0.9)
Total Protein: 6 g/dL — ABNORMAL LOW (ref 6.5–8.1)

## 2015-06-09 LAB — RENAL FUNCTION PANEL
Albumin: 2 g/dL — ABNORMAL LOW (ref 3.5–5.0)
Anion gap: 12 (ref 5–15)
BUN: 32 mg/dL — AB (ref 6–20)
CHLORIDE: 97 mmol/L — AB (ref 101–111)
CO2: 25 mmol/L (ref 22–32)
Calcium: 8 mg/dL — ABNORMAL LOW (ref 8.9–10.3)
Creatinine, Ser: 5.53 mg/dL — ABNORMAL HIGH (ref 0.61–1.24)
GFR calc Af Amer: 11 mL/min — ABNORMAL LOW (ref >60)
GFR calc non Af Amer: 10 mL/min — ABNORMAL LOW (ref >60)
GLUCOSE: 104 mg/dL — AB (ref 65–99)
POTASSIUM: 4.6 mmol/L (ref 3.5–5.1)
Phosphorus: 4.2 mg/dL (ref 2.5–4.6)
Sodium: 134 mmol/L — ABNORMAL LOW (ref 135–145)

## 2015-06-09 LAB — GLUCOSE, CAPILLARY
GLUCOSE-CAPILLARY: 109 mg/dL — AB (ref 65–99)
Glucose-Capillary: 63 mg/dL — ABNORMAL LOW (ref 65–99)
Glucose-Capillary: 59 mg/dL — ABNORMAL LOW (ref 65–99)
Glucose-Capillary: 71 mg/dL (ref 65–99)

## 2015-06-09 MED ORDER — PENTAFLUOROPROP-TETRAFLUOROETH EX AERO: 1.0000 "application " | INHALATION_SPRAY | CUTANEOUS | Status: DC | PRN

## 2015-06-09 MED ORDER — SODIUM CHLORIDE 0.9 % IV SOLN: 100.0000 mL | INTRAVENOUS | Status: DC | PRN

## 2015-06-09 MED ORDER — LORAZEPAM 0.5 MG PO TABS
ORAL_TABLET | ORAL | Status: AC
  Filled 2015-06-09: qty 1

## 2015-06-09 MED ORDER — ALTEPLASE 2 MG IJ SOLR
2.0000 mg | Freq: Once | INTRAMUSCULAR | Status: DC | PRN
  Filled 2015-06-09: qty 2

## 2015-06-09 MED ORDER — HEPARIN SODIUM (PORCINE) 1000 UNIT/ML DIALYSIS: 1000.0000 [IU] | INTRAMUSCULAR | Status: DC | PRN

## 2015-06-09 MED ORDER — LIDOCAINE-PRILOCAINE 2.5-2.5 % EX CREA
1.0000 "application " | TOPICAL_CREAM | CUTANEOUS | Status: DC | PRN
  Filled 2015-06-09: qty 5

## 2015-06-09 MED ORDER — LIDOCAINE HCL (PF) 1 % IJ SOLN: 5.0000 mL | INTRAMUSCULAR | Status: DC | PRN

## 2015-06-09 NOTE — Progress Notes (Addendum)
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 No drainage Regular S1S2 No S3 and no rub Abd soft / no focal or RUQ tenderness 1+ LE edema right >left with healing vein harvest sites RLE no cellulitus Neuro alert, awake. Mild generalized weakness. Bilateral foot drop. LFA AVF +bruit - cannulated at the present time  Home HD / K Machine - MWF 4h  79kg  2/2.25 bath  LUE AVF   Calc 0.25 ug daily Mircera ?  Last outpt P 6.2, pth 549, tsat 53, ferr 935      Assessment: 1 ESRD MWF dialysis (at home) - on hemo at the present time on usual schedule. Has goal of around 3 kg today based on weights.  2 NSTEMI - s/p CABG x 3 10/14. . (LIMA to LAD, SVG to CIRCUMFLEX, and SVG to PDA) with EVH from right thigh greater saphenous vein and left internal mammary artery harvest. EF 20-25% on 06/06/15 ECHO. On BB.  3 Vent bigeminy - on coreg, amio dc'd due to ^LFT's. HR 60's in HD right now and no ectopy noted on tele 4 Anemia cont esa. Last Hb 10.4 Aranesp at 200/week in the hospital. Dose due 10/26. 5 MBD cont meds (calcitriol, ca acetate, cinacalcet) 6 HTN good bp control 7 Chronic polyneuropathy/ debility - has lift chair at home, with plans for OT/PT at discharge 8 Abnormal LFT's - marked increase, peak alk phos 1252, transaminases 180-200, bili 1,7. Amio and statin have been stopped. Says has had some intermittent issues with statin in the past (taken on/off by Dr. Jimmy Footman). LFT's pending from today.  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  Jamal Maes, MD [facility]  Pager 06/09/2015, 7:43 AM     Recent Labs Lab 06/04/15 0505 06/05/15 1503 06/07/15 0416 06/08/15 0437  NA 135  --  131* 130*  K 3.6 4.5 4.5 5.2*  CL 94*  --  93* 94*  CO2 28  --  28 27  GLUCOSE 255*  --  81 144*  BUN 18  --  20 29*  CREATININE 3.46*  --  4.01* 5.26*  CALCIUM 8.5*  --  7.8* 7.8*    Recent Labs Lab 06/03/15 0745 06/07/15 0416 06/08/15 0437  AST 54* 200* 180*  ALT 57 170* 176*  ALKPHOS 443* 1252* 1230*  BILITOT 1.8* 1.7* 1.7*  PROT 5.7* 6.2* 5.8*  ALBUMIN 1.9* 2.0* 2.0*    Recent Labs Lab 06/04/15 0505 06/07/15 0416 06/08/15 0437  WBC 7.3 8.1 8.3  HGB 10.1* 10.6* 10.4*  HCT 31.8* 33.2* 32.4*  MCV 94.9 96.8 95.9  PLT 153 218 251   . aspirin EC  81 mg Oral Daily  . bisacodyl  10 mg Oral Daily   Or  . bisacodyl  10 mg Rectal Daily  . calcitRIOL  0.25 mcg Oral BID  . calcium acetate  667 mg Oral TID WC  . carvedilol  6.25 mg Oral BID WC  . chlorhexidine  15 mL Mouth/Throat BID  .  cinacalcet  30 mg Oral QHS  . [START ON 06/11/2015] darbepoetin (ARANESP) injection - DIALYSIS  200 mcg Intravenous Q Wed-HD  . docusate sodium  200 mg Oral Daily  . escitalopram  20 mg Oral QHS  . fentaNYL  100 mcg Transdermal Q72H  . insulin aspart  0-9 Units Subcutaneous TID WC  . insulin detemir  10 Units Subcutaneous BID  . multivitamin  1 tablet Oral QHS  . pantoprazole  40 mg Oral Daily  . polyethylene glycol  8.5-17 g Oral QHS  . sodium chloride  3 mL Intravenous Q12H  . tamsulosin  0.4 mg Oral QHS     sodium chloride, sodium chloride, alteplase, heparin, HYDROmorphone, Influenza vac split quadrivalent PF, lidocaine (PF), lidocaine-prilocaine, LORazepam, menthol-cetylpyridinium, metoprolol, ondansetron (ZOFRAN) IV, pentafluoroprop-tetrafluoroeth, phenol, sodium chloride, sodium chloride

## 2015-06-09 NOTE — Progress Notes (Signed)
Patient Name: Daniel Anthony Date of Encounter: 06/09/2015  Active Problems:   CAD (coronary artery disease)   Non-STEMI (non-ST elevated myocardial infarction) [facility])   Coronary artery disease involving native coronary artery of native heart with unstable angina pectoris (HCC)   Cardiomyopathy, ischemic   Anemia of chronic disease   Chronic pain   Ventricular bigeminy   Patient Profile: 64 y/o male, admitted for NSTEMI. Required CABG. Now day 9 post-op.   SUBJECTIVE  Feeling well. No chest pain, sob or palpitations. Plan to discharge today after HD.   CURRENT MEDS . aspirin EC  81 mg Oral Daily  . bisacodyl  10 mg Oral Daily   Or  . bisacodyl  10 mg Rectal Daily  . calcitRIOL  0.25 mcg Oral BID  . calcium acetate  667 mg Oral TID WC  . carvedilol  6.25 mg Oral BID WC  . chlorhexidine  15 mL Mouth/Throat BID  . cinacalcet  30 mg Oral QHS  . [START ON 06/11/2015] darbepoetin (ARANESP) injection - DIALYSIS  200 mcg Intravenous Q Wed-HD  . docusate sodium  200 mg Oral Daily  . escitalopram  20 mg Oral QHS  . fentaNYL  100 mcg Transdermal Q72H  . insulin aspart  0-9 Units Subcutaneous TID WC  . insulin detemir  10 Units Subcutaneous BID  . LORazepam      . multivitamin  1 tablet Oral QHS  . pantoprazole  40 mg Oral Daily  . polyethylene glycol  8.5-17 g Oral QHS  . sodium chloride  3 mL Intravenous Q12H  . tamsulosin  0.4 mg Oral QHS    OBJECTIVE  Filed Vitals:   06/09/15 0930 06/09/15 0945 06/09/15 1000 06/09/15 1030  BP: 93/64 114/64 109/59 124/61  Pulse: 70 69 68 72  Temp:      TempSrc:      Resp:      Height:      Weight:      SpO2:       No intake or output data in the 24 hours ending 06/09/15 1045 Filed Weights   06/08/15 0520 06/09/15 0519 06/09/15 0700  Weight: 178 lb 12.7 oz (81.1 kg) 181 lb 10.5 oz (82.4 kg) 181 lb 10.5 oz (82.4 kg)    PHYSICAL EXAM  General: Pleasant, NAD. Neuro: Alert and oriented X 3. Moves all extremities  spontaneously. Psych: Normal affect. HEENT:  Normal  Neck: Supple without bruits or JVD. Lungs:  Resp regular and unlabored, CTA. Heart: RRR no s3, s4, or murmurs. Abdomen: Soft, non-tender, non-distended, BS + x 4.  Extremities: No clubbing, cyanosis or edema. DP/PT/Radials 2+ and equal bilaterally.  Accessory Clinical Findings  CBC  Recent Labs  06/07/15 0416 06/08/15 0437  WBC 8.1 8.3  HGB 10.6* 10.4*  HCT 33.2* 32.4*  MCV 96.8 95.9  PLT 218 251   Basic Metabolic Panel  Recent Labs  06/07/15 0416 06/08/15 0437  NA 131* 130*  K 4.5 5.2*  CL 93* 94*  CO2 28 27  GLUCOSE 81 144*  BUN 20 29*  CREATININE 4.01* 5.26*  CALCIUM 7.8* 7.8*   Liver Function Tests  Recent Labs  06/08/15 0437 06/09/15 0509  AST 180* 165*  ALT 176* 167*  ALKPHOS 1230* 1174*  BILITOT 1.7* 1.2  PROT 5.8* 6.0*  ALBUMIN 2.0* 2.0*    TELE  NSR  Radiology/Studies  Dg Chest 1 View  05/26/2015  CLINICAL DATA:  Possible syncopal episode with bradycardia and brief episode of unresponsiveness.  Hyperglycemia. EXAM: CHEST 1 VIEW COMPARISON:  08/16/2013 FINDINGS: Shallow inspiration with atelectasis in the lung bases. Cardiac enlargement with mild pulmonary vascular congestion, developing since previous study. Mild perihilar infiltration suggesting early edema. Small right pleural effusion. No pneumothorax. Calcified and tortuous aorta. IMPRESSION: Shallow inspiration with linear atelectasis in the mid and lower lungs. Cardiac enlargement with mild pulmonary vascular congestion, mild perihilar edema, and small right pleural effusion. Electronically Signed   By: Burman Nieves M.D.   On: 05/26/2015 00:06   Dg Chest 2 View  06/07/2015  CLINICAL DATA:  Shortness of breath. EXAM: CHEST  2 VIEW COMPARISON:  06/03/2015 FINDINGS: Sequelae of prior CABG are again identified. Left subclavian central venous catheter is unchanged with tip near the cavoatrial junction. Thoracic aortic calcification is  again seen. Cardiac silhouette remains enlarged. The lungs remain hypoinflated. There is minimal right basilar opacity, similar to slightly improved from the prior study. Heterogeneous left basilar opacities have improved. Small bilateral pleural effusions remain. There is no overt pulmonary edema. No pneumothorax is seen. IMPRESSION: Persistent small bilateral pleural effusions. Mildly improved aeration of the left lung base. Electronically Signed   By: Sebastian Ache M.D.   On: 06/07/2015 07:23   Ct Chest Wo Contrast  05/28/2015  CLINICAL DATA:  Shortness of breath. Anterior chest pain, right jaw pain. EXAM: CT CHEST WITHOUT CONTRAST TECHNIQUE: Multidetector CT imaging of the chest was performed following the standard protocol without IV contrast. COMPARISON:  Chest x-ray 05/25/2015.  Chest CT 06/24/2005 FINDINGS: Small bilateral pleural effusions. Compressive atelectasis in the lower lobes bilaterally. Ground-glass opacities in the right upper lobe and superior segment of the right lower lobe as well as superior segment of left lower lobe. Cannot exclude areas of pneumonia. Heart is enlarged. Densely calcified coronary arteries diffusely. Aorta is calcified, non aneurysmal. No mediastinal, hilar, or axillary adenopathy. Chest wall soft tissues are unremarkable. Imaging into the upper abdomen shows no acute findings. No acute bony abnormality or focal bone lesion. IMPRESSION: Small bilateral pleural effusions with compressive atelectasis in the lower lobes. Ground-glass opacities in the superior segments of the lower lobes and inferior right upper lobe. Cannot exclude pneumonia. Severe coronary artery disease. Cardiomegaly. Electronically Signed   By: Charlett Nose M.D.   On: 05/28/2015 12:46   Dg Chest Port 1 View  06/03/2015  CLINICAL DATA:  Patient with shortness of breath. EXAM: PORTABLE CHEST 1 VIEW COMPARISON:  Chest radiograph 06/02/2015 FINDINGS: Left subclavian central venous catheter tip projects  over the superior vena cava. Interval removal right IJ central venous catheter. Monitoring leads overlie the patient. Stable cardiomegaly status post median sternotomy and CABG procedure. Low lung volumes. Stable small bilateral pleural effusions with left-greater-than-right basilar heterogeneous pulmonary opacities. No pneumothorax. IMPRESSION: Left subclavian central venous catheter tip projects over the superior vena cava. Cardiomegaly. Bilateral small pleural effusions with left-greater-than-right basilar heterogeneous opacities. Electronically Signed   By: Annia Belt M.D.   On: 06/03/2015 08:24   Dg Chest Port 1 View  06/02/2015  CLINICAL DATA:  Status post CABG. EXAM: PORTABLE CHEST 1 VIEW COMPARISON:  Chest radiograph from one day prior. FINDINGS: Right internal jugular central venous sheath terminates in the upper third of the superior vena cava. Left subclavian central venous catheter terminates over the upper right atrium. Median sternotomy wires are aligned and intact. Stable cardiomediastinal silhouette with mild cardiomegaly. No pneumothorax. Stable slight blunting of the right costophrenic angle, suggesting a trace right pleural effusion. No left pleural effusion. Stable mild to moderate  bibasilar atelectasis. No overt pulmonary edema. IMPRESSION: 1. Stable mild cardiomegaly without overt pulmonary edema. 2. Stable trace right pleural effusion. 3. Stable mild to moderate bibasilar atelectasis. Electronically Signed   By: Delbert Phenix M.D.   On: 06/02/2015 07:41   Dg Chest Port 1 View  06/01/2015  CLINICAL DATA:  05-30-15 S/p CABG x 3  diabetes.  Hyperlipidemia. EXAM: PORTABLE CHEST 1 VIEW COMPARISON:  1 day prior FINDINGS: Right IJ Cordis sheath remains. Swan-Ganz catheter removed. Left-sided subclavian line tip at low SVC. Removal of mediastinal drain and left chest tube. Midline trachea. Cardiomegaly accentuated by AP portable technique. Atherosclerosis in the transverse aorta. Trace  bilateral pleural fluid. No pneumothorax. Low lung volumes with resultant pulmonary interstitial prominence. No congestive failure. Left greater than right bibasilar airspace disease. Diminished lung volumes. IMPRESSION: Minimal worsening of left-sided aeration, likely due to progressive atelectasis. Otherwise, similar appearance of the chest with low lung volumes, cardiomegaly, and trace bilateral pleural fluid. Resolved pulmonary venous congestion. Electronically Signed   By: Jeronimo Greaves M.D.   On: 06/01/2015 08:58   Dg Chest Port 1 View  05/31/2015  CLINICAL DATA:  Status post CABG x3. EXAM: PORTABLE CHEST 1 VIEW COMPARISON:  05/30/2015 FINDINGS: Right IJ Swan-Ganz catheter unchanged in position with tip at proximal right pulmonary artery. Mediastinal drain. Left-sided subclavian line tip not well visualized. Prior median sternotomy. Left chest tube is unchanged. Cardiomegaly accentuated by AP portable technique. Suspect trace right pleural fluid. No pneumothorax. Mild pulmonary venous congestion, accentuated by low lung volumes. Increasing right hemidiaphragm elevation with adjacent atelectasis. IMPRESSION: Cardiomegaly with developing mild pulmonary venous congestion. Progressive right hemidiaphragm elevation with overlying atelectasis and volume loss. Electronically Signed   By: Jeronimo Greaves M.D.   On: 05/31/2015 10:37   Dg Chest Port 1 View  05/30/2015  CLINICAL DATA:  Status post coronary artery bypass grafting. Hypoxia. EXAM: PORTABLE CHEST 1 VIEW COMPARISON:  Chest radiograph May 25, 2015; chest CT May 28, 2015 FINDINGS: Endotracheal tube tip is 2.8 cm above the carina. Swan-Ganz catheter tip is in the right main pulmonary artery. A second central catheter is present with the tip in the right atrium. There is a left chest tube and a mediastinal drain. Nasogastric tube tip and side port are below the diaphragm. There is no demonstrable pneumothorax. There is slight bibasilar atelectasis.  Lungs elsewhere clear. There is a minimal right pleural effusion. Heart is borderline enlarged with pulmonary vascularity within normal limits. No adenopathy. IMPRESSION: Tube and catheter positions as described without pneumothorax. Slight bibasilar atelectasis. Minimal right effusion. Lungs elsewhere clear. Heart prominent but stable. Electronically Signed   By: Bretta Bang III M.D.   On: 05/30/2015 13:54    ASSESSMENT AND PLAN   1. NSTEMI: day 9 s/p CABG x 3. (LIMA to LAD, SVG to CIRCUMFLEX, and SVG to PDA) with EVH from right thigh greater saphenous vein and left internal mammary artery harvest. Denies CP and dyspnea. Continue ASA to 81 mg.   2. Ventricular Bigeminy: resolved with increase of Coreg to 6.25 mg BID and addition of amiodarone. Maintaining NSR. HR controlled in the upper 50- - mid 60's without further ectopy. Will not increase coreg presently. Now off amio.   3. ESRD: on HD. Nephrology following.   4. Ischemic Cardiomyopathy: EF 30-35%. Continue volume control through HD. Continue BB therapy with Coreg. No ACE/ARB given ESRD.   5. Abnormal LFT's: Markedly increased, but no further increase from yesterday; off amiodarone and statin. By history he  has had transient LFT elevation in the past with statin therapy.   Lorelei PontSigned, Bhagat,Bhavinkumar PA-C Pager [phone]   Patient seen and examined and history reviewed. Agree with above findings and plan. Patient stable for DC from a cardiac standpoint. Since initial presentation was with NSTEMI would consider adding Plavix 75 mg daily if ok with CT surgery.  Shayden Bobier Swazil[place]Jordan, MDFACC 06/09/2015 1:37 PM

## 2015-06-09 NOTE — Telephone Encounter (Signed)
TCM call. Left information and CB number on pt VM

## 2015-06-09 NOTE — Progress Notes (Signed)
I explained discharge instructions to pt and spouse. They verbalized understanding. PIV  And telemetry discontinued. Pt discharged to home in care of his wife with home health follow up.

## 2015-06-09 NOTE — Progress Notes (Addendum)
Inpatient Diabetes Program Recommendations  AACE/ADA: New Consensus Statement on Inpatient Glycemic Control (2015)  Target Ranges:  Prepandial:   less than 140 mg/dL      Peak postprandial:   less than 180 mg/dL (1-2 hours)      Critically ill patients:  140 - 180 mg/dL  Review of glycemic control  Results for Kizzie FurnishJAMES, Aristides M (MRN [id number]) as of 06/09/2015 12:22  Ref. Range 06/08/2015 21:28 06/09/2015 06:29 06/09/2015 06:44 06/09/2015 07:02 06/09/2015 11:06  Glucose-Capillary Latest Ref Range: 65-99 mg/dL 409156 (H) 63 (L) 59 (L) 71 109 (H)    Outpatient Diabetes medications: Levemir 42 units q HS, Novolog 12-14 units with lunch  Current orders for Inpatient glycemic control: Lantus 10 units bid, Novolog 0-9 units tid  Inpatient Diabetes Program Recommendations: Patient had a low blood sugar both yesterday and today - consider decreasing Lantus to 10 units qday   Susette RacerJulie Yomaris Palecek, RN, [place], [place]HA, CDE Diabetes Coordinator Inpatient Diabetes Program  [phone] (Team Pager) [phone] [facility] Office) 06/09/2015 12:24 PM

## 2015-06-09 NOTE — Progress Notes (Signed)
PT Cancellation Note  Patient Details Name: Daniel Anthony MRN: [id number] DOB: 1951/07/08   Cancelled Treatment:    Reason Eval/Treat Not Completed: Patient at procedure or test/unavailable Pt off floor at HD.Will follow up next available time.   Blake DivineShauna A Skii Cleland 06/09/2015, 9:32 AM  Mylo RedShauna Kaushik Maul, PT, DPT [phone]

## 2015-06-09 NOTE — Progress Notes (Signed)
Utilization review completed.  

## 2015-06-09 NOTE — Care Management Note (Signed)
Case Management Note CM note started by [name] RNCM   Patient Details  Name: Daniel FurnishRobert M Hora MRN: [id number] Date of Birth: [birth year]  Subjective/Objective:    Pt admitted with NON STEMI, CABG scheduled for 05/30/15                Action/Plan:  Pt is from home with wife.  Per pt; he has been suffering from neuropathy for over 14 years and is not independently ambulating at home.  Pt states he uses wheelchair outside of home but is able to manage with cane inside home.  Post surgery Cardiac Rehab will work with pt and provide recommendations for discharge needs.  CM will continue to monitor.   Expected Discharge Date:    06/09/15            Expected Discharge Plan:  Home w Home Health Services  In-House Referral:     Discharge planning Services  CM Consult  Post Acute Care Choice:  Durable Medical Equipment, Home Health Choice offered to:  Patient, Spouse  DME Arranged:  3-N-1 DME Agency:  [facility]  HH Arranged[facility]  [facility]  [facility]  Status of Service:  Completed, signed off  Medicare Important Message Given:  Yes-fourth notification given Date Medicare IM Given:    Medicare IM give by:    Date Additional Medicare IM Given:    Additional Medicare Important Message give by:     If discussed at Long Length of Stay Meetings, dates discussed:  06/05/15  Additional Comments:  06/09/15- Silva BandyKristi Briceida Rasberry RNCM- pt for home today- [facility]- 3n1 to be delivered to room prior to discharge- [facility]- referral has been accepted and Corrie DandyMary to f/u with pt and wife [facility].   06/06/15- Kianni Lheureux RNCM- f/u done with pt and wife regarding [facility]- per pt and wife- they [facility]- orders have been placed -[facility] for possible weekend discharge- 3n1 arranged [name] [facility] notified of DME need- 3n1  to be delivered to room prior to discharge.   06/05/15- CIR consulted for possible admission- however per [name] with CIR - insurance has denied admission to CIR- spoke with pt and wife at bedside regarding insurance denial for CIR and plans for discharge- discussed home with HH vs STSNF which pt would need to be set up with outpt HD prior to SNF - per pt he would prefer to return home and continue to do home HD with wife support- states that he has a lift chair at home along with cane and walker, states that he would like a 3n1 for home- reports that they would have friend/family that could assist them and offer supervision when wife not there. Wife agreeable to plan to return home- [facility] [place]- per pt/wife would like to think about [facility] for services will f/u for choice- notified Randall Hissonnielle Zimmerman PA of CIR denial and [facility]- will need order for HH-PT/OT with F2F and DME 3n1.   Darrold SpanWebster, Semira Stoltzfus Hall, RN 06/09/2015, 1:36 PM

## 2015-06-09 NOTE — Progress Notes (Signed)
OT cancellation    06/09/15 0847  OT Visit Information  Last OT Received On 06/09/15  Reason Eval/Treat Not Completed Patient at procedure or test/ unavailable. Pt at HD.   Jenell MillinerLindsey Treana Lacour, OTR/L [phone]

## 2015-06-09 NOTE — Procedures (Signed)
I have personally attended this patient's dialysis session.  2K2.5Ca bath pending labs UF goal to 79 kg. AVF 400  HR 60's BP stable around 110 so far.  Daniel Balynthia [name], MD [facility] [phone] Pager 06/09/2015, 8:10 AM

## 2015-06-09 NOTE — Progress Notes (Signed)
10 Days Post-Op Procedure(s) (LRB):  CORONARY ARTERY BYPASS GRAFTING (CABG) x 3 (LIMA to LAD, SVG to CIRCUMFLEX, and SVG to PDA) with EVH from right thigh greater saphenous vein and left internal mammary artery harvest (N/A) TRANSESOPHAGEAL ECHOCARDIOGRAM (TEE) (N/A) Subjective: Doing better after CABG NSR LFTs better- mild elevation so statin stopped Ready for DC with HH PT and RN  Objective: Vital signs in last 24 hours: Temp:  [97.7 F (36.5 C)-98.2 F (36.8 C)] 97.8 F (36.6 C) (10/24 0700) Pulse Rate:  [63-70] 68 (10/24 1000) Cardiac Rhythm:  [-] Normal sinus rhythm (10/24 0710) Resp:  [18-20] 20 (10/24 0700) BP: (93-142)/(52-75) 109/59 mmHg (10/24 1000) SpO2:  [97 %-99 %] 98 % (10/24 0700) Weight:  [181 lb 10.5 oz (82.4 kg)] 181 lb 10.5 oz (82.4 kg) (10/24 0700)  Hemodynamic parameters for last 24 hours:  stable  Intake/Output from previous day:   Intake/Output this shift:    Incision clean Lungs clear No edema  Lab Results:  Recent Labs  06/07/15 0416 06/08/15 0437  WBC 8.1 8.3  HGB 10.6* 10.4*  HCT 33.2* 32.4*  PLT 218 251   BMET:  Recent Labs  06/07/15 0416 06/08/15 0437  NA 131* 130*  K 4.5 5.2*  CL 93* 94*  CO2 28 27  GLUCOSE 81 144*  BUN 20 29*  CREATININE 4.01* 5.26*  CALCIUM 7.8* 7.8*    PT/INR: No results for input(s): LABPROT, INR in the last 72 hours. ABG    Component Value Date/Time   PHART 7.330* 05/30/2015 1947   HCO3 24.1* 05/30/2015 1947   TCO2 20 05/31/2015 1728   ACIDBASEDEF 2.0 05/30/2015 1947   O2SAT 98.0 05/30/2015 1947   CBG (last 3)   Recent Labs  06/09/15 0629 06/09/15 0644 06/09/15 0702  GLUCAP 63* 59* 71    Assessment/Plan: S/P Procedure(s) (LRB):  CORONARY ARTERY BYPASS GRAFTING (CABG) x 3 (LIMA to LAD, SVG to CIRCUMFLEX, and SVG to PDA) with EVH from right thigh greater saphenous vein and left internal mammary artery harvest (N/A) TRANSESOPHAGEAL ECHOCARDIOGRAM (TEE) (N/A) Mobilize Diabetes  control d/c pacing wires Plan for discharge: see discharge orders   LOS: 14 days    Kathlee Nationseter Van Trigt III 06/09/2015

## 2015-06-10 DIAGNOSIS — Z48812 Encounter for surgical aftercare following surgery on the circulatory system: Secondary | ICD-10-CM | POA: Diagnosis not present

## 2015-06-17 ENCOUNTER — Telehealth: Payer: Self-pay | Admitting: *Deleted

## 2015-06-17 NOTE — Telephone Encounter (Signed)
Spoke w/ pt's wife.  She states that pt is concerned about seeing PA instead of Dr. Mariah MillingGollan, as Dr. Mariah MillingGollan is aware of his history and he does not want to have to explain it all again. Explained to her that Alycia RossettiRyan is very thorough and that he will go through pt's record before his appt so that he will be familiar w/ pt's history. She states that she feels better about the appt and is looking forward to meeting Ryan on Thursday.

## 2015-06-17 NOTE — Telephone Encounter (Signed)
Pt wife calling stating pt had heart attack oct 9 and bypass on the 14 th.  She has questions about follow up. Wouldn't give me the questions but asked if we could call her back when we could.

## 2015-06-18 ENCOUNTER — Telehealth: Payer: Self-pay

## 2015-06-18 MED ORDER — NITROGLYCERIN 0.4 MG SL SUBL: 0.4000 mg | SUBLINGUAL_TABLET | SUBLINGUAL | Status: AC | PRN

## 2015-06-18 MED ORDER — LORAZEPAM 0.5 MG PO TABS: 0.5000 mg | ORAL_TABLET | Freq: Two times a day (BID) | ORAL | Status: DC

## 2015-06-18 NOTE — Telephone Encounter (Signed)
Per Dr. Aldean AstGilbert-aa

## 2015-06-19 ENCOUNTER — Encounter: Payer: Medicare Other | Admitting: Physician Assistant

## 2015-06-20 ENCOUNTER — Telehealth: Payer: Self-pay | Admitting: Cardiovascular Disease

## 2015-06-20 NOTE — Telephone Encounter (Signed)
3rd attempt  to schedule from recall list. LMOV to call office for scheduling. ° ° °Deleting recall.   °

## 2015-06-23 ENCOUNTER — Encounter: Payer: Self-pay | Admitting: Physician Assistant

## 2015-06-23 ENCOUNTER — Ambulatory Visit (INDEPENDENT_AMBULATORY_CARE_PROVIDER_SITE_OTHER): Payer: Medicare Other | Admitting: Physician Assistant

## 2015-06-23 VITALS — BP 163/82 | HR 80 | Ht 68.0 in | Wt 174.0 lb

## 2015-06-23 DIAGNOSIS — N186 End stage renal disease: Secondary | ICD-10-CM

## 2015-06-23 DIAGNOSIS — Z992 Dependence on renal dialysis: Secondary | ICD-10-CM

## 2015-06-23 DIAGNOSIS — I5022 Chronic systolic (congestive) heart failure: Secondary | ICD-10-CM | POA: Insufficient documentation

## 2015-06-23 DIAGNOSIS — R945 Abnormal results of liver function studies: Secondary | ICD-10-CM

## 2015-06-23 DIAGNOSIS — I25110 Atherosclerotic heart disease of native coronary artery with unstable angina pectoris: Secondary | ICD-10-CM

## 2015-06-23 DIAGNOSIS — I255 Ischemic cardiomyopathy: Secondary | ICD-10-CM | POA: Diagnosis not present

## 2015-06-23 DIAGNOSIS — Z951 Presence of aortocoronary bypass graft: Secondary | ICD-10-CM

## 2015-06-23 DIAGNOSIS — I2510 Atherosclerotic heart disease of native coronary artery without angina pectoris: Secondary | ICD-10-CM

## 2015-06-23 DIAGNOSIS — D638 Anemia in other chronic diseases classified elsewhere: Secondary | ICD-10-CM

## 2015-06-23 DIAGNOSIS — R7989 Other specified abnormal findings of blood chemistry: Secondary | ICD-10-CM

## 2015-06-23 DIAGNOSIS — I951 Orthostatic hypotension: Secondary | ICD-10-CM

## 2015-06-23 DIAGNOSIS — G6181 Chronic inflammatory demyelinating polyneuritis: Secondary | ICD-10-CM

## 2015-06-23 NOTE — Progress Notes (Signed)
Cardiology Hospital Follow Up Note:   Date of Encounter: 06/23/2015  ID: Daniel Anthony, DOB Jan 10, 1951, MRN [id number]  PCP: Megan Mansichard Gilbert Jr, MD Primary Cardiologist: Dr. Mariah MillingGollan, MD  Chief Complaint  Patient presents with  . other    [facility] Meds reviewed verbally with pt.     HPI:  64 year old male with history of CAD s/p recent 3 vessel CABG on 05/30/2015 in the setting of a NSTEMI, with prior anterior MI in 1995 with a stent to the mid LCx at that time, ESRD on HD at home, ischemic cardiomyopathy with EF 20-25% by echo 06/06/2015, DM2 with gastroparesis, HTN, HLD, anemia of chronic disease, and chronic demyelinating polyneuropathy who presents for hospital follow up after his recent cardiac bypass surgery in October.   He previously underwent cardiac cath in 2006 that showed the mid LCx to be patent with minimal luminal irregs and normal LV function. Stress test in 2012 showed no significant ischemia. Echo in 2012 was essentially normal.   According to his wife on 10/9, while he was on HD at home, he had an episode where he became unresponsive and slumped over.His wife shook him and he opened his eyes but they appeared glazed over and she called 911.This episode lasted about 2 minutes. [facility] ED with the above concerns. He had another unresponsive episode with transient heart rates dropping into the 30's. Troponin was found to be elevated at 1.31, ECG showed ST depression in I, aVL, V4-V6 which was new and minimal ST elevation in III. In ruled in for a NSTEMI. He was found to have a glucose of 688. [facility] evaluation and treatment. He underwent cardiac cath on 10/10 that showed severe 3 vessel CAD with severe ostial circumflex, hazy moderate to severe proximal LAD stenosis, severe proximal RCA stenosis, moderately severe LV systolic dysfunction and and LD filling defect suggestive of thrombus. He underwent successful 3 vessel CABG on  05/30/2015 with (LIMA-LAD, SVG-LCx, SVG-PDA). Post op he developed ventricular bigeminy that was managed with amiodarone that was ultimately discontinued 2/2 his elevated LFTs. His Crestor was also discontinued for the above reason.    Since his discharge he has done well. No further chest pain. He is participating in home PT 3x weekly. He is not able to walk, though does perform leg lifts and this has really helped him per his report. Blood pressure have been "all over the place." They have previously been running in the 170's systolic pre-CABG. However, since his bypass his readings have run from the 160's systolic to as low as 60 systolic during HD. Because of this his Coreg was decreased from 6.25 mg bid to 3.125 mg bid today by nephrology to allow for more permissive BP's. He has not been taking off much fluids during HD, per his remote 300 mL. Weight has been stable.     Past Medical History  Diagnosis Date  . Bradycardia     Hx of  . Pleural effusion, right   . Hypomagnesemia   . Hypertension   . Diabetes mellitus   . Hyperlipidemia   . Coronary artery disease     a. ant MI 1995 s/p PCI to mid LCX; b. cath 2006 w/ patent mLCx stent & minimal irregs in LAD, nl EF; c. stress test 2012: no ischemia; d. NSTEMI 05/2015: cath showed ost to pLAD 70%, prox to mid LAD 50%, D1 25%, ost D2 90%, ost LCx  to prox LCx 99%, prox to mid LCx 25% (previously tx'd w/ stent), pRCA-1 80%, pRCA-2 90%; e. s/p 3 v CABG 05/30/15 (LIMA-LAD, VG-LCx, VG-PDA)  . 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 MI (myocardial infarction) (HCC) 1995    anterior  . CIDP (chronic inflammatory demyelinating polyneuropathy) (HCC)   . Polyneuropathy in diabetes(357.2) 12/13/2012  . Abnormality of gait   . Foot drop, bilateral 05/10/2013  . End stage renal disease on dialysis (HCC)   . Dialysis patient (HCC)   . Ischemic cardiomyopathy     a. echo 05/26/15: EF 30-35%, sev HK of entire inf myocardium,  elevated ventricular filling pressure, mild MR; b.   . Chronic systolic CHF (congestive heart failure) (HCC)     a. echo 06/06/2015: EF 20-25%, diff HK, GR2DD, mild MR, Ao valve mobility was restricted w/o stenosis,   . Anemia of chronic disease   : Past Surgical History  Procedure Laterality Date  . Tonsillectomy    . Cardiac catheterization  1995    2 stents   . Coronary angioplasty with stent placement  1995  . Lithotripsy    . Cardiac catheterization N/A 05/26/2015    Procedure: Left Heart Cath and Coronary Angiography;  Surgeon: Kathleene Hazel, MD;  Location: [facility] INVASIVE CV LAB;  Service: Cardiovascular;  Laterality: N/A;  . Coronary artery bypass graft N/A 05/30/2015    Procedure:  CORONARY ARTERY BYPASS GRAFTING (CABG) x 3 (LIMA to LAD, SVG to CIRCUMFLEX, and SVG to PDA) with EVH from right thigh greater saphenous vein and left internal mammary artery harvest;  Surgeon: Kerin Perna, MD;  Location: [facility] OR;  Service: Open Heart Surgery;  Laterality: N/A;  . Tee without cardioversion N/A 05/30/2015    Procedure: TRANSESOPHAGEAL ECHOCARDIOGRAM (TEE);  Surgeon: Kerin Perna, MD;  Location: [facility] OR;  Service: Open Heart Surgery;  Laterality: N/A;  : Family History  Problem Relation Age of Onset  . Heart attack Father   . Hypertension Father   . Diabetes Father   . Cancer Father     prostate  . Hypertension Mother   . Cancer Maternal Grandmother     colon  :  reports that he quit smoking about 35 years ago. His smoking use included Cigarettes. He has never used smokeless tobacco. He reports that he does not drink alcohol or use illicit drugs.:   Allergies:  Allergies  Allergen Reactions  . Zosyn [Piperacillin Sod-Tazobactam So] Itching and Rash     Home Medications:  Current Outpatient Prescriptions  Medication Sig Dispense Refill  . aspirin 325 MG tablet Take 325 mg by mouth at bedtime.     . calcitRIOL (ROCALTROL) 0.25 MCG capsule Take 0.5 mcg by mouth at  bedtime.     . calcium acetate (PHOSLO) 667 MG capsule Take 1 capsule (667 mg total) by mouth 3 (three) times daily with meals. (Patient taking differently: Takes 2 capsules before snacks and 3 capsules before meals daily.)    . carvedilol (COREG) 3.125 MG tablet Take 3.125 mg by mouth 2 (two) times daily with a meal. If systolic is above 110.    . cinacalcet (SENSIPAR) 30 MG tablet Take 30 mg by mouth at bedtime.     . Darbepoetin Alfa (ARANESP) 200 MCG/0.4ML SOSY injection Inject 0.4 mLs (200 mcg total) into the vein every Tuesday with hemodialysis. 1.68 mL   . docusate sodium (COLACE) 100 MG capsule Take 200 mg by mouth at bedtime.    Marland Kitchen  escitalopram (LEXAPRO) 20 MG tablet TAKE 1 TABLET BY MOUTH EVERY DAY (Patient taking differently: TAKE 1 TABLET BY MOUTH EVERY DAY AT BEDTIME) 90 tablet 0  . fentaNYL (DURAGESIC - DOSED MCG/HR) 100 MCG/HR Place 100 mcg onto the skin every 3 (three) days.    . insulin aspart (NOVOLOG) 100 UNIT/ML injection Inject 0-9 Units into the skin 3 (three) times daily with meals. (Patient taking differently: Inject 14-18 Units into the skin 3 (three) times daily with meals. ) 10 mL 11  . insulin detemir (LEVEMIR) 100 UNIT/ML injection Inject 0.1 mLs (10 Units total) into the skin 2 (two) times daily. (Patient taking differently: Inject 42 Units into the skin 2 (two) times daily. ) 10 mL 11  . lidocaine-prilocaine (EMLA) cream Apply 2.5 application topically 3 (three) times a week. Apply to dialysis site one hour before treatment    . LORazepam (ATIVAN) 0.5 MG tablet Take 1 tablet (0.5 mg total) by mouth 2 (two) times daily. (Patient taking differently: Take 0.5 mg by mouth every 6 (six) hours as needed. ) 60 tablet 5  . multivitamin (RENA-VIT) TABS tablet Take 1 tablet by mouth at bedtime.     . nitroGLYCERIN (NITROSTAT) 0.4 MG SL tablet Place 1 tablet (0.4 mg total) under the tongue every 5 (five) minutes as needed for chest pain. 25 tablet 5  . polyethylene glycol (MIRALAX /  GLYCOLAX) packet Take 8.5-17 g by mouth at bedtime.     . Polyvinyl Alcohol (LIQUID TEARS OP) Place 1 drop into both eyes 2 (two) times daily as needed (dry eyes post procedure (every 2 months)).    . tamsulosin (FLOMAX) 0.4 MG CAPS capsule TAKE 1 CAPSULE BY MOUTH EVERY DAY (Patient taking differently: TAKE 1 CAPSULE BY MOUTH EVERY DAY AT BEDTIME) 90 capsule 3   No current facility-administered medications for this visit.     Review of Systems:  Review of Systems  Constitutional: Positive for malaise/fatigue. Negative for fever, chills, weight loss and diaphoresis.  HENT: Negative for congestion.   Eyes: Negative for discharge and redness.  Respiratory: Negative for cough, hemoptysis, sputum production, shortness of breath and wheezing.   Cardiovascular: Negative for chest pain, palpitations, orthopnea, claudication, leg swelling and PND.  Gastrointestinal: Negative for nausea, vomiting and abdominal pain.  Musculoskeletal: Negative for myalgias and falls.  Skin: Negative for rash.  Neurological: Negative for dizziness, sensory change, speech change, focal weakness, loss of consciousness and weakness.  Endo/Heme/Allergies: Does not bruise/bleed easily.  Psychiatric/Behavioral: The patient is nervous/anxious.        "My major issue is anxiety."  All other systems reviewed and are negative.    Physical Exam:  Blood pressure 163/82, pulse 80, height 5\' 8"  (1.727 m), weight 174 lb (78.926 kg). BMI: Body mass index is 26.46 kg/(m^2). General: Pleasant, NAD. Psych: Normal affect. Responds to questions with normal affect.  Neuro: Alert and oriented X 3. Moves all extremities spontaneously. HEENT: Normocephalic, atraumatic. EOM intact bilaterally. Sclera anicteric.  Neck: Trachea midline. Supple without bruits or JVD. Lungs:  Respirations regular and unlabored, CTA bilaterally without wheezing, crackles, or rhonchi.  Heart: RRR, normal s3, s4. No murmurs, rubs, or gallops. Well healed  surgical scars.  Abdomen: Soft, non-tender, non-distended, BS + x 4.  Extremities: No clubbing, cyanosis or edema. DP/PT/Radials 2+ and equal bilaterally.   Accessory Clinical Findings:  EKG: NSR, 79 bpm, TWI II, III, aVF, V3-V6  Recent Labs: 05/26/2015: TSH 0.583 05/31/2015: Magnesium 2.2 06/08/2015: Hemoglobin 10.4*; Platelets 251 06/09/2015: ALT 167*;  BUN 32*; Creatinine, Ser 5.53*; Potassium 4.6; Sodium 134*  No results found for: CHOL, TRIG, HDL, CHOLHDL, VLDL, LDLCALC, LDLDIRECT  Weights: Wt Readings from Last 3 Encounters:  06/23/15 174 lb (78.926 kg)  06/09/15 175 lb 7.8 oz (79.6 kg)  05/25/15 180 lb (81.647 kg)    Estimated Creatinine Clearance: 13.1 mL/min (by C-G formula based on Cr of 5.53).   Other studies Reviewed: Additional studies/ records that were reviewed today include: prior office notes, [facility] ED note, and [facility] admission.  Assessment & Plan:  1. CAD s/p 3 vessel CABG and prior stenting as above:  -Doing well, no further chest pain -Continue aspirin 325 mg daily at this time -He would benefit from Plavix 75 mg daily per the CURE Trial, though would need clearance from cardiothoracic prior to decreasing aspirin and starting Plavix  -Continue Coreg 3.125 mg bid (6.25 mg if systolic BP is greater than 110). His orthostasis and hypotension during HD preclude titration of his beta blocker or addition of other cardio-protective medications -Check CMET, if LFTs have returned to baseline consider restarting Crestor  -Because of his demyelinating illness he cannot participate in Heart Track. However, he has been participating in home PT 3 x weekly and is scheduled to do so for the next 5 weeks. Given his history of recent CABG and underlying comorbidities, this may need to be extended  2. Ischemic cardiomyopathy:  -EF 20-25% on echo 10/21 -Coreg was decreased from 6.25 mg bid to 3.125 mg bid by nephrology on 11/7 given significant orthostatic hypotension and  hypotension associated with HD (60s/30s) -These readings preclude further titration of beta blocker or addition of Imdur/hydralazine. This was discussed with him in detail today -Recheck echo 3 months post 3 revascularization to assess for possible improved EF. If EF remains less than 35% he will need referral to EP for evaluation for possible AICD -Could consider change to Toprol XL to possibly allow for more BP room, though Coreg is the better heart failure medication  -Volume management per HD   3. HTN:  -He is hypertensive today, though has not taken his Coreg -As above, Nephrology has decreased his Coreg from 6.25 mg bid to 3.125 mg bid today to allow for more permissive blood pressures with HD -He will continue to monitor his BPs closely and notify us should they remain consistently elevated   4. HLD:  -Check CMET as above -If LFTs have returned to baseline consider restarting Crestor  5. ESRD on HD:  -Per Nephrology  -He does not appear volume overloaded today  6. Anemia of chronic disease: -Stable  7. Anxiety: -This was his self reported biggest complaint today -Continue Lexapro and lorazepam -He is in good spirits today -No SI/HI noted   Dispo: -Follow up 2 weeks  Current medicines are reviewed at length with the patient today.  The patient did not have any concerns regarding medicines.   Eula Listen, PA-C [facility] [address] [place], [place] [address] [phone] [facility] 06/23/2015, 4:51 PM

## 2015-06-23 NOTE — Patient Instructions (Addendum)
Medication Instructions:  Please continue your current medications  Labwork: CMET  Testing/Procedures: Your physician has requested that you have an echocardiogram after August 30, 2015. Echocardiography is a painless test that uses sound waves to create images of your heart. It provides your doctor with information about the size and shape of your heart and how well your heart's chambers and valves are working. This procedure takes approximately one hour. There are no restrictions for this procedure.  Follow-Up: 2 weeks  If you need a refill on your cardiac medications before your next appointment, please call your pharmacy.  Echocardiogram An echocardiogram, or echocardiography, uses sound waves (ultrasound) to produce an image of your heart. The echocardiogram is simple, painless, obtained within a short period of time, and offers valuable information to your health care provider. The images from an echocardiogram can provide information such as:  Evidence of coronary artery disease (CAD).  Heart size.  Heart muscle function.  Heart valve function.  Aneurysm detection.  Evidence of a past heart attack.  Fluid buildup around the heart.  Heart muscle thickening.  Assess heart valve function. LET [facility] KNOW ABOUT:  Any allergies you have.  All medicines you are taking, including vitamins, herbs, eye drops, creams, and over-the-counter medicines.  Previous problems you or members of your family have had with the use of anesthetics.  Any blood disorders you have.  Previous surgeries you have had.  Medical conditions you have.  Possibility of pregnancy, if this applies. BEFORE THE PROCEDURE  No special preparation is needed. Eat and drink normally.  PROCEDURE   In order to produce an image of your heart, gel will be applied to your chest and a wand-like tool (transducer) will be moved over your chest. The gel will help transmit the sound waves  from the transducer. The sound waves will harmlessly bounce off your heart to allow the heart images to be captured in real-time motion. These images will then be recorded.  You may need an IV to receive a medicine that improves the quality of the pictures. AFTER THE PROCEDURE You may return to your normal schedule including diet, activities, and medicines, unless your health care provider tells you otherwise.   This information is not intended to replace advice given to you by your health care provider. Make sure you discuss any questions you have with your health care provider.   Document Released: 07/30/2000 Document Revised: 08/23/2014 Document Reviewed: 04/09/2013 Elsevier Interactive Patient Education [employer]2016 Elsevier Inc.

## 2015-06-24 LAB — COMPREHENSIVE METABOLIC PANEL
A/G RATIO: 1.1 (ref 1.1–2.5)
ALT: 82 IU/L — AB (ref 0–44)
AST: 38 IU/L (ref 0–40)
Albumin: 3.2 g/dL — ABNORMAL LOW (ref 3.6–4.8)
Alkaline Phosphatase: 518 IU/L — ABNORMAL HIGH (ref 39–117)
BILIRUBIN TOTAL: 0.6 mg/dL (ref 0.0–1.2)
BUN/Creatinine Ratio: 4 — ABNORMAL LOW (ref 10–22)
BUN: 19 mg/dL (ref 8–27)
CHLORIDE: 92 mmol/L — AB (ref 97–106)
CO2: 23 mmol/L (ref 18–29)
Calcium: 8.8 mg/dL (ref 8.6–10.2)
Creatinine, Ser: 5.22 mg/dL — ABNORMAL HIGH (ref 0.76–1.27)
GFR calc Af Amer: 12 mL/min/{1.73_m2} — ABNORMAL LOW (ref >59)
GFR calc non Af Amer: 11 mL/min/{1.73_m2} — ABNORMAL LOW (ref >59)
GLOBULIN, TOTAL: 3 g/dL (ref 1.5–4.5)
Glucose: 306 mg/dL — ABNORMAL HIGH (ref 65–99)
POTASSIUM: 4.4 mmol/L (ref 3.5–5.2)
SODIUM: 136 mmol/L (ref 136–144)
Total Protein: 6.2 g/dL (ref 6.0–8.5)

## 2015-06-25 ENCOUNTER — Other Ambulatory Visit: Payer: Self-pay

## 2015-06-25 DIAGNOSIS — R748 Abnormal levels of other serum enzymes: Secondary | ICD-10-CM

## 2015-06-25 DIAGNOSIS — R7989 Other specified abnormal findings of blood chemistry: Secondary | ICD-10-CM

## 2015-06-25 DIAGNOSIS — R945 Abnormal results of liver function studies: Secondary | ICD-10-CM

## 2015-06-30 ENCOUNTER — Other Ambulatory Visit: Payer: Self-pay | Admitting: Emergency Medicine

## 2015-06-30 MED ORDER — FENTANYL 75 MCG/HR TD PT72: 75.0000 ug | MEDICATED_PATCH | TRANSDERMAL | Status: DC

## 2015-06-30 MED ORDER — FENTANYL 75 MCG/HR TD PT72: 75.0000 ug | MEDICATED_PATCH | TRANSDERMAL | Status: AC

## 2015-06-30 MED ORDER — LORAZEPAM 1 MG PO TABS: 1.0000 mg | ORAL_TABLET | Freq: Three times a day (TID) | ORAL | Status: AC

## 2015-07-01 ENCOUNTER — Other Ambulatory Visit: Payer: Self-pay | Admitting: Cardiothoracic Surgery

## 2015-07-01 DIAGNOSIS — Z951 Presence of aortocoronary bypass graft: Secondary | ICD-10-CM

## 2015-07-02 ENCOUNTER — Encounter: Payer: Self-pay | Admitting: Cardiothoracic Surgery

## 2015-07-02 ENCOUNTER — Ambulatory Visit
Admission: RE | Admit: 2015-07-02 | Discharge: 2015-07-02 | Disposition: A | Discharge status: Home / Self Care | Payer: Medicare Other | Source: Ambulatory Visit | Attending: Cardiothoracic Surgery | Admitting: Cardiothoracic Surgery

## 2015-07-02 ENCOUNTER — Ambulatory Visit (INDEPENDENT_AMBULATORY_CARE_PROVIDER_SITE_OTHER): Payer: Self-pay | Admitting: Cardiothoracic Surgery

## 2015-07-02 VITALS — BP 154/82 | HR 87 | Resp 16 | Ht 68.0 in | Wt 174.0 lb

## 2015-07-02 DIAGNOSIS — I214 Non-ST elevation (NSTEMI) myocardial infarction: Secondary | ICD-10-CM

## 2015-07-02 DIAGNOSIS — Z951 Presence of aortocoronary bypass graft: Secondary | ICD-10-CM

## 2015-07-02 DIAGNOSIS — I2510 Atherosclerotic heart disease of native coronary artery without angina pectoris: Secondary | ICD-10-CM

## 2015-07-02 NOTE — Progress Notes (Signed)
PCP is Megan Mansichard Gilbert Jr, MD Referring Provider is Antonieta IbaGollan, Timothy J, MD  Chief Complaint  Patient presents with  . Routine Post Op    s/p CABG X 3 05/20/15 with a cxr    HPI: One month followup for multivessel CABG for this chronically ill 64 year old diabetic on home dialysis with ischemic cardiomyopathy preop ejection fraction 20-25%. The patient is done well since surgery. His mobility is significantly limited from a demyelinating neuropathy. The incisions are healing. He has no significant edema. Blood pressure has been stable after adjusting his beta blocker and he has remained in sinus rhythm. He denies any recurrent symptoms of angina or CHF. Chest x-ray performed today shows no evidence of pulmonary edema or pleural effusions, sternal wires intact and stable.  Past Medical History  Diagnosis Date  . Bradycardia     Hx of  . Pleural effusion, right   . Hypomagnesemia   . Hypertension   . Diabetes mellitus   . Hyperlipidemia   . Coronary artery disease     a. ant MI 1995 s/p PCI to mid LCX; b. cath 2006 w/ patent mLCx stent & minimal irregs in LAD, nl EF; c. stress test 2012: no ischemia; d. NSTEMI 05/2015: cath showed ost to pLAD 70%, prox to mid LAD 50%, D1 25%, ost D2 90%, ost LCx to prox LCx 99%, prox to mid LCx 25% (previously tx'd w/ stent), pRCA-1 80%, pRCA-2 90%; e. s/p 3 v CABG 05/30/15 (LIMA-LAD, VG-LCx, VG-PDA)  . 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 MI (myocardial infarction) (HCC) 1995    anterior  . CIDP (chronic inflammatory demyelinating polyneuropathy) (HCC)   . Polyneuropathy in diabetes(357.2) 12/13/2012  . Abnormality of gait   . Foot drop, bilateral 05/10/2013  . End stage renal disease on dialysis (HCC)   . Dialysis patient (HCC)   . Ischemic cardiomyopathy     a. echo 05/26/15: EF 30-35%, sev HK of entire inf myocardium, elevated ventricular filling pressure, mild MR; b.   . Chronic systolic CHF (congestive heart failure)  (HCC)     a. echo 06/06/2015: EF 20-25%, diff HK, GR2DD, mild MR, Ao valve mobility was restricted w/o stenosis,   . Anemia of chronic disease     Past Surgical History  Procedure Laterality Date  . Tonsillectomy    . Cardiac catheterization  1995    2 stents   . Coronary angioplasty with stent placement  1995  . Lithotripsy    . Cardiac catheterization N/A 05/26/2015    Procedure: Left Heart Cath and Coronary Angiography;  Surgeon: Kathleene Hazelhristopher D McAlhany, MD;  [facility];  Service: Cardiovascular;  Laterality: N/A;  . Coronary artery bypass graft N/A 05/30/2015    Procedure:  CORONARY ARTERY BYPASS GRAFTING (CABG) x 3 (LIMA to LAD, SVG to CIRCUMFLEX, and SVG to PDA) with EVH from right thigh greater saphenous vein and left internal mammary artery harvest;  Surgeon: Kerin PernaPeter Van Trigt, MD;  [facility];  Service: Open Heart Surgery;  Laterality: N/A;  . Tee without cardioversion N/A 05/30/2015    Procedure: TRANSESOPHAGEAL ECHOCARDIOGRAM (TEE);  Surgeon: Kerin PernaPeter Van Trigt, MD;  [facility];  Service: Open Heart Surgery;  Laterality: N/A;    Family History  Problem Relation Age of Onset  . Heart attack Father   . Hypertension Father   . Diabetes Father   . Cancer Father     prostate  . Hypertension Mother   .  Cancer Maternal Grandmother     colon    Social History Social History  Substance Use Topics  . Smoking status: Former Smoker    Types: Cigarettes    Quit date: 08/17/1979  . Smokeless tobacco: Never Used  . Alcohol Use: No    Current Outpatient Prescriptions  Medication Sig Dispense Refill  . aspirin 325 MG tablet Take 325 mg by mouth at bedtime.     . calcitRIOL (ROCALTROL) 0.25 MCG capsule Take 0.5 mcg by mouth at bedtime.     . calcium acetate (PHOSLO) 667 MG capsule Take 1 capsule (667 mg total) by mouth 3 (three) times daily with meals. (Patient taking differently: Takes 1 capsules before snacks and 3 capsules before meals daily.)    .  carvedilol (COREG) 3.125 MG tablet Take 3.125 mg by mouth 2 (two) times daily with a meal. If systolic is above 110    . cinacalcet (SENSIPAR) 30 MG tablet Take 30 mg by mouth at bedtime.     . Darbepoetin Alfa (ARANESP) 200 MCG/0.4ML SOSY injection Inject 0.4 mLs (200 mcg total) into the vein every Tuesday with hemodialysis. (Patient taking differently: Inject 200 mcg into the vein every 30 (thirty) days. ) 1.68 mL   . docusate sodium (COLACE) 100 MG capsule Take 200 mg by mouth at bedtime.    Marland Kitchen escitalopram (LEXAPRO) 20 MG tablet TAKE 1 TABLET BY MOUTH EVERY DAY (Patient taking differently: TAKE 1 TABLET BY MOUTH EVERY DAY AT BEDTIME) 90 tablet 0  . fentaNYL (DURAGESIC - DOSED MCG/HR) 75 MCG/HR Place 1 patch (75 mcg total) onto the skin every 3 (three) days. 10 patch 0  . insulin aspart (NOVOLOG) 100 UNIT/ML injection Inject 0-9 Units into the skin 3 (three) times daily with meals. (Patient taking differently: Inject 12-15 Units into the skin 3 (three) times daily with meals. ) 10 mL 11  . insulin detemir (LEVEMIR) 100 UNIT/ML injection Inject 0.1 mLs (10 Units total) into the skin 2 (two) times daily. (Patient taking differently: Inject 42 Units into the skin 2 (two) times daily. ) 10 mL 11  . lidocaine-prilocaine (EMLA) cream Apply 2.5 application topically 3 (three) times a week. Apply to dialysis site one hour before treatment    . LORazepam (ATIVAN) 1 MG tablet Take 1 tablet (1 mg total) by mouth 3 (three) times daily. 90 tablet 5  . multivitamin (RENA-VIT) TABS tablet Take 1 tablet by mouth at bedtime.     . nitroGLYCERIN (NITROSTAT) 0.4 MG SL tablet Place 1 tablet (0.4 mg total) under the tongue every 5 (five) minutes as needed for chest pain. 25 tablet 5  . polyethylene glycol (MIRALAX / GLYCOLAX) packet Take 8.5-17 g by mouth at bedtime.     . Polyvinyl Alcohol (LIQUID TEARS OP) Place 1 drop into both eyes 2 (two) times daily as needed (dry eyes post procedure (every 2 months)).    .  tamsulosin (FLOMAX) 0.4 MG CAPS capsule TAKE 1 CAPSULE BY MOUTH EVERY DAY (Patient taking differently: TAKE 1 CAPSULE BY MOUTH EVERY DAY AT BEDTIME) 90 capsule 3   No current facility-administered medications for this visit.    Allergies  Allergen Reactions  . Zosyn [Piperacillin Sod-Tazobactam So] Itching and Rash    Review of Systems   Improved appetite and overall strength Improved sleeping habits No symptoms of heart failure or angina  BP 154/82 mmHg  Pulse 87  Resp 16  Ht  (1.727 m)  Wt 174 lb (78.926 kg)  BMI  26.46 kg/m2  SpO2 97% Physical Exam Alert and oriented, appears chronically ill from his renal failure Lungs clear bilaterally Sternum stable well-healed, heart rhythm stable and regular Abdomen soft nontender Extremities without edema leg incision well-healed Neuro intact  Diagnostic Tests: Chest x-ray clear with minimal basilar atelectasis-low lung volumes  Impression: Excellent early course after multivessel CABG. Cardiology is considering adding Plavix and reducing aspirin 81 mg daily which would be appropriate in this patient. The patient will resume his statin after LFTs return closer to normal He's not able to participate in hospital based cardiac rehabilitation a will continue home physical therapy. He will be able use his upper extremities more in assisting his core strength in moving from bed to chair.  Plan: Return to surgical clinic as  needed. Medical therapy taken over by cardiology and his nephrologist.he knows to call if he has any questions or issues with the surgical incisions.  Mikey Bussing, MD [facility] [phone]

## 2015-07-07 ENCOUNTER — Other Ambulatory Visit: Payer: Self-pay | Admitting: Family Medicine

## 2015-07-17 ENCOUNTER — Ambulatory Visit (INDEPENDENT_AMBULATORY_CARE_PROVIDER_SITE_OTHER): Payer: Medicare Other | Admitting: Cardiovascular Disease

## 2015-07-17 ENCOUNTER — Encounter: Payer: Self-pay | Admitting: Cardiovascular Disease

## 2015-07-17 ENCOUNTER — Ambulatory Visit: Payer: Medicare Other | Admitting: Physician Assistant

## 2015-07-17 VITALS — BP 120/74 | HR 78 | Ht 68.0 in | Wt 176.4 lb

## 2015-07-17 DIAGNOSIS — N186 End stage renal disease: Secondary | ICD-10-CM

## 2015-07-17 DIAGNOSIS — Z794 Long term (current) use of insulin: Secondary | ICD-10-CM

## 2015-07-17 DIAGNOSIS — N184 Chronic kidney disease, stage 4 (severe): Secondary | ICD-10-CM

## 2015-07-17 DIAGNOSIS — E1165 Type 2 diabetes mellitus with hyperglycemia: Secondary | ICD-10-CM

## 2015-07-17 DIAGNOSIS — G6181 Chronic inflammatory demyelinating polyneuritis: Secondary | ICD-10-CM | POA: Diagnosis not present

## 2015-07-17 DIAGNOSIS — I5022 Chronic systolic (congestive) heart failure: Secondary | ICD-10-CM | POA: Diagnosis not present

## 2015-07-17 DIAGNOSIS — E785 Hyperlipidemia, unspecified: Secondary | ICD-10-CM

## 2015-07-17 DIAGNOSIS — I2510 Atherosclerotic heart disease of native coronary artery without angina pectoris: Secondary | ICD-10-CM

## 2015-07-17 DIAGNOSIS — E1122 Type 2 diabetes mellitus with diabetic chronic kidney disease: Secondary | ICD-10-CM

## 2015-07-17 DIAGNOSIS — IMO0002 Reserved for concepts with insufficient information to code with codable children: Secondary | ICD-10-CM

## 2015-07-17 MED ORDER — TICAGRELOR 90 MG PO TABS: 90.0000 mg | ORAL_TABLET | Freq: Two times a day (BID) | ORAL | Status: AC

## 2015-07-17 MED ORDER — ROSUVASTATIN CALCIUM 40 MG PO TABS: 40.0000 mg | ORAL_TABLET | Freq: Every day | ORAL | Status: AC

## 2015-07-17 NOTE — Assessment & Plan Note (Signed)
He does home dialysis daily, euvolemic[place]

## 2015-07-17 NOTE — Progress Notes (Signed)
Patient ID: Daniel Anthony, male    DOB: [birth year], 64 y.o.   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 Stress test was done early 2012 that showed no significant ischemia.  ischemic cardiomyopathy with EF 20-25% by echo 06/06/2015, DM2 with gastroparesis, HTN, HLD, anemia of chronic disease, and chronic demyelinating polyneuropathy who presents for hospital follow up after his recent cardiac bypass surgery in October.   on 10/9, while  on HD at home, Daniel Anthony had an episode of unresponsive.This episode lasted about 2 minutes.  Daniel Anthony presented to [facility] ED, had another unresponsive episode with transient heart rates dropping into the 30's.  Troponin was found to be elevated at 1.31, glucose of 688.   transferred to [facility] for further evaluation and treatment. cardiac cath on 10/10 that showed severe 3 vessel CAD with severe ostial circumflex, hazy moderate to severe proximal LAD stenosis, severe proximal RCA stenosis, moderately severe LV systolic dysfunction and and LD filling defect suggestive of thrombus.   underwent successful 3 vessel CABG on 05/30/2015 with (LIMA-LAD, SVG-LCx, SVG-PDA). Post op Daniel Anthony developed ventricular bigeminy that was managed with amiodarone   In follow-up today, Daniel Anthony reports that Daniel Anthony continues to feel very weak. Daniel Anthony has been doing PT 3 times per week at home, finds it a big inconvenience to do cardiac rehabilitation at the  hospital and prefers to do PT at home Denies any significant leg edema, very mild at times. Feels that his sternal incision is healing well Wonders if there is anything Daniel Anthony can do for low cardiac function seen on echocardiogram last month Continues to struggle with his diabetes Prior to the surgery would do some walking to the bathroom, now needing assistance. Wearing a brace for footdrop   Other past medical history Echocardiogram February 2012 was essentially normal. There was no mention on whether there was diastolic dysfunction. Study was read by the outside cardiologist  Daniel Anthony reports Daniel Anthony takes Crestor 40 mg 3 days per week  previous  lab work showing total cholesterol 135, LDL 60, HDL 43   Allergies  Allergen Reactions  . Zosyn [Piperacillin Sod-Tazobactam So] Itching and Rash    Outpatient Encounter Prescriptions as of 07/17/2015  Medication Sig  . calcitRIOL (ROCALTROL) 0.25 MCG capsule Take 0.5 mcg by mouth at bedtime.   . calcitRIOL (ROCALTROL) 0.25 MCG capsule Take 2 tablets by mouth at bedtime.  . carvedilol (COREG) 12.5 MG tablet Take 12.5 mg by mouth 2 (two) times daily with a meal.  . cinacalcet (SENSIPAR) 30 MG tablet Take 30 mg by mouth at bedtime.   . Darbepoetin Alfa (ARANESP) 200 MCG/0.4ML SOSY injection Inject 0.4 mLs (200 mcg total) into the vein every Tuesday with hemodialysis. (Patient taking differently: Inject 200 mcg into the vein every 30 (thirty) days. )  . docusate sodium (COLACE) 100 MG capsule Take 200 mg by mouth at bedtime.  Marland Kitchen escitalopram (LEXAPRO) 20 MG tablet TAKE 1 TABLET BY MOUTH EVERY DAY  . fentaNYL (DURAGESIC - DOSED  MCG/HR) 75 MCG/HR Place 1 patch (75 mcg total) onto the skin every 3 (three) days.  . insulin aspart (NOVOLOG) 100 UNIT/ML injection Inject 0-9 Units into the skin 3 (three) times daily with meals. (Patient taking differently: Inject 12-15 Units into the skin 3 (three) times daily with meals. )  . insulin detemir (LEVEMIR) 100 UNIT/ML  injection Inject 0.1 mLs (10 Units total) into the skin 2 (two) times daily. (Patient taking differently: Inject 42 Units into the skin 2 (two) times daily. )  . lidocaine-prilocaine (EMLA) cream Apply 2.5 application topically 3 (three) times a week. Apply to dialysis site one hour before treatment  . LORazepam (ATIVAN) 1 MG tablet Take 1 tablet (1 mg total) by mouth 3 (three) times daily.  . multivitamin (RENA-VIT) TABS tablet Take 1 tablet by mouth at bedtime.   . nitroGLYCERIN (NITROSTAT) 0.4 MG SL tablet Place 1 tablet (0.4 mg total) under the tongue every 5 (five) minutes as needed for chest pain.  . polyethylene glycol (MIRALAX / GLYCOLAX) packet Take 8.5-17 g by mouth at bedtime.   . Polyvinyl Alcohol (LIQUID TEARS OP) Place 1 drop into both eyes 2 (two) times daily as needed (dry eyes post procedure (every 2 months)).  . tamsulosin (FLOMAX) 0.4 MG CAPS capsule TAKE 1 CAPSULE BY MOUTH EVERY DAY (Patient taking differently: TAKE 1 CAPSULE BY MOUTH EVERY DAY AT BEDTIME)  . [DISCONTINUED] aspirin 325 MG tablet Take 325 mg by mouth at bedtime.   . rosuvastatin (CRESTOR) 40 MG tablet Take 1 tablet (40 mg total) by mouth daily.  . ticagrelor (BRILINTA) 90 MG TABS tablet Take 1 tablet (90 mg total) by mouth 2 (two) times daily.  . [DISCONTINUED] calcium acetate (PHOSLO) 667 MG capsule Take 1 capsule (667 mg total) by mouth 3 (three) times daily with meals. (Patient not taking: Reported on 07/17/2015)  . [DISCONTINUED] carvedilol (COREG) 3.125 MG tablet Take 3.125 mg by mouth 2 (two) times daily with a meal. If systolic is above 110   No facility-administered encounter medications on file as of 07/17/2015.    Past Medical History  Diagnosis Date  . Bradycardia     Hx of  . Pleural effusion, right   . Hypomagnesemia   . Hypertension   . Diabetes mellitus   . Hyperlipidemia   . Coronary artery disease     a. ant MI 1995 s/p PCI to mid LCX; b. cath 2006 w/ patent mLCx stent & minimal irregs in  LAD, nl EF; c. stress test 2012: no ischemia; d. NSTEMI 05/2015: cath showed ost to pLAD 70%, prox to mid LAD 50%, D1 25%, ost D2 90%, ost LCx to prox LCx 99%, prox to mid LCx 25% (previously tx'd w/ stent), pRCA-1 80%, pRCA-2 90%; e. s/p 3 v CABG 05/30/15 (LIMA-LAD, VG-LCx, VG-PDA)  . 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 MI (myocardial infarction) (HCC) 1995    anterior  . CIDP (chronic inflammatory demyelinating polyneuropathy) (HCC)   . Polyneuropathy in diabetes(357.2) 12/13/2012  . Abnormality of gait   . Foot drop, bilateral 05/10/2013  . End stage renal disease on dialysis (HCC)   . Dialysis patient (HCC)   . Ischemic cardiomyopathy     a. echo 05/26/15: EF 30-35%, sev HK of entire inf myocardium, elevated ventricular filling pressure, mild MR; b.   . Chronic systolic CHF (congestive heart failure) (HCC)     a. echo 06/06/2015: EF 20-25%, diff HK, GR2DD, mild MR, Ao  valve mobility was restricted w/o stenosis,   . Anemia of chronic disease     Past Surgical History  Procedure Laterality Date  . Tonsillectomy    . Cardiac catheterization  1995    2 stents   . Coronary angioplasty with stent placement  1995  . Lithotripsy    . Cardiac catheterization N/A 05/26/2015    Procedure: Left Heart Cath and Coronary Angiography;  Surgeon: Kathleene Hazelhristopher D McAlhany, MD;  [facility];  Service: Cardiovascular;  Laterality: N/A;  . Coronary artery bypass graft N/A 05/30/2015    Procedure:  CORONARY ARTERY BYPASS GRAFTING (CABG) x 3 (LIMA to LAD, SVG to CIRCUMFLEX, and SVG to PDA) with EVH from right thigh greater saphenous vein and left internal mammary artery harvest;  Surgeon: Kerin PernaPeter Van Trigt, MD;  [facility];  Service: Open Heart Surgery;  Laterality: N/A;  . Tee without cardioversion N/A 05/30/2015    Procedure: TRANSESOPHAGEAL ECHOCARDIOGRAM (TEE);  Surgeon: Kerin PernaPeter Van Trigt, MD;  [facility];  Service: Open Heart Surgery;  Laterality:  N/A;    Social History  reports that Daniel Anthony quit smoking about 35 years ago. His smoking use included Cigarettes. Daniel Anthony has never used smokeless tobacco. Daniel Anthony reports that Daniel Anthony does not drink alcohol or use illicit drugs.  Family History family history includes Cancer in his father and maternal grandmother; Diabetes in his father; Heart attack in his father; Hypertension in his father and mother.   Review of Systems  Constitutional: Positive for fatigue.  Respiratory: Negative.   Cardiovascular: Negative.   Gastrointestinal: Negative.   Musculoskeletal: Positive for gait problem.       Profound lower extremity muscle weakness, muscle atrophy in his hands  Skin: Negative.   Neurological: Positive for weakness.  Hematological: Negative.   Psychiatric/Behavioral: Negative.   All other systems reviewed and are negative.   BP 120/74 mmHg  Pulse 78  Ht 5\' 8"  (1.727 m)  Wt 176 lb 5.9 oz (80 kg)  BMI 26.82 kg/m2  Physical Exam  Constitutional: Daniel Anthony is oriented to person, place, and time. Daniel Anthony appears well-developed and well-nourished.   muscle wasting  in his hands, thin legs  HENT:  Head: Normocephalic.  Nose: Nose normal.  Mouth/Throat: Oropharynx is clear and moist.  Eyes: Conjunctivae are normal. Pupils are equal, round, and reactive to light.  Neck: Normal range of motion. Neck supple. No JVD present.  Cardiovascular: Normal rate, regular rhythm, S1 normal, S2 normal, normal heart sounds and intact distal pulses.  Exam reveals no gallop and no friction rub.   No murmur heard. Pulmonary/Chest: Effort normal and breath sounds normal. No respiratory distress. Daniel Anthony has no wheezes. Daniel Anthony has no rales. Daniel Anthony exhibits no tenderness.  Abdominal: Soft. Bowel sounds are normal. Daniel Anthony exhibits no distension. There is no tenderness.  Musculoskeletal: Normal range of motion. Daniel Anthony exhibits no edema or tenderness.  Lymphadenopathy:    Daniel Anthony has no cervical adenopathy.  Neurological: Daniel Anthony is alert and oriented to person,  place, and time. Coordination normal.  Skin: Skin is warm and dry. No rash noted. No erythema.  Psychiatric: Daniel Anthony has a normal mood and affect. His behavior is normal. Judgment and thought content normal.      Assessment and Plan   Nursing note and vitals reviewed.

## 2015-07-17 NOTE — Patient Instructions (Addendum)
You are doing well.  Start brilinta twice a day with low dose aspirin  Please restart crestor 1/2 pill for a month, titrating up slowly  I will research use of entresto in HD folks  Echo 09/01/2015 for cardiomyoapthy  Please call us if you have new issues that need to be addressed before your next appt.  Your physician wants you to follow-up in: 2 months.

## 2015-07-17 NOTE — Assessment & Plan Note (Signed)
Severe three-vessel disease, post bypass  Currently with no symptoms of angina  recommended he stay on low-dose aspirin, will add Brilinta  Given recent non-STEMI

## 2015-07-17 NOTE — Assessment & Plan Note (Signed)
Encouraged him to stay on his Crestor. Goal LDL less than 70 

## 2015-07-17 NOTE — Assessment & Plan Note (Signed)
Wife reports leg strength has dramatically weekend, likely from inactivity, postsurgical state. He is declining cardiac rehabilitation, prefers home PT 3 times per week

## 2015-07-17 NOTE — Assessment & Plan Note (Signed)
Stressed importance of aggressive diet modification, close follow-up with Dr. Sullivan LoneGilbert  May benefit from consultation with endocrinology given challenging to control diabetes

## 2015-07-17 NOTE — Assessment & Plan Note (Addendum)
Currently on carvedilol, hemodialysis, appears euvolemic  we'll try to avoid ACE inhibitor or ARB, entresto  Given risk of hyperkalemia.  if blood pressure tolerates, could try very low-dose BiDil.  We will hold off for now and discuss in follow-up

## 2015-08-01 ENCOUNTER — Other Ambulatory Visit: Payer: Self-pay

## 2015-08-01 ENCOUNTER — Ambulatory Visit
Admission: RE | Admit: 2015-08-01 | Discharge: 2015-08-01 | Disposition: A | Discharge status: Home / Self Care | Payer: Medicare Other | Source: Ambulatory Visit | Attending: Family Medicine | Admitting: Family Medicine

## 2015-08-01 DIAGNOSIS — R059 Cough, unspecified: Secondary | ICD-10-CM

## 2015-08-01 DIAGNOSIS — R05 Cough: Secondary | ICD-10-CM | POA: Insufficient documentation

## 2015-08-01 DIAGNOSIS — R0600 Dyspnea, unspecified: Secondary | ICD-10-CM

## 2015-08-01 DIAGNOSIS — I509 Heart failure, unspecified: Secondary | ICD-10-CM

## 2015-08-01 DIAGNOSIS — J90 Pleural effusion, not elsewhere classified: Secondary | ICD-10-CM | POA: Diagnosis not present

## 2015-08-01 DIAGNOSIS — R0602 Shortness of breath: Secondary | ICD-10-CM | POA: Insufficient documentation

## 2015-08-09 DIAGNOSIS — Z48812 Encounter for surgical aftercare following surgery on the circulatory system: Secondary | ICD-10-CM | POA: Diagnosis not present

## 2015-08-14 ENCOUNTER — Telehealth: Payer: Self-pay | Admitting: Neurology

## 2015-08-14 ENCOUNTER — Other Ambulatory Visit: Payer: Self-pay | Admitting: Emergency Medicine

## 2015-08-14 ENCOUNTER — Other Ambulatory Visit
Admission: RE | Admit: 2015-08-14 | Discharge: 2015-08-14 | Disposition: A | Discharge status: Home / Self Care | Payer: Medicare Other | Source: Other Acute Inpatient Hospital | Attending: Neurology | Admitting: Neurology

## 2015-08-14 DIAGNOSIS — N186 End stage renal disease: Secondary | ICD-10-CM

## 2015-08-14 DIAGNOSIS — E1122 Type 2 diabetes mellitus with diabetic chronic kidney disease: Secondary | ICD-10-CM

## 2015-08-14 DIAGNOSIS — Z794 Long term (current) use of insulin: Secondary | ICD-10-CM

## 2015-08-14 DIAGNOSIS — R410 Disorientation, unspecified: Secondary | ICD-10-CM

## 2015-08-14 DIAGNOSIS — I255 Ischemic cardiomyopathy: Secondary | ICD-10-CM

## 2015-08-14 DIAGNOSIS — I2510 Atherosclerotic heart disease of native coronary artery without angina pectoris: Secondary | ICD-10-CM | POA: Diagnosis not present

## 2015-08-14 DIAGNOSIS — N184 Chronic kidney disease, stage 4 (severe): Secondary | ICD-10-CM

## 2015-08-14 DIAGNOSIS — N136 Pyonephrosis: Secondary | ICD-10-CM | POA: Diagnosis present

## 2015-08-14 DIAGNOSIS — I5022 Chronic systolic (congestive) heart failure: Secondary | ICD-10-CM | POA: Diagnosis not present

## 2015-08-14 DIAGNOSIS — E1165 Type 2 diabetes mellitus with hyperglycemia: Secondary | ICD-10-CM

## 2015-08-14 DIAGNOSIS — IMO0002 Reserved for concepts with insufficient information to code with codable children: Secondary | ICD-10-CM

## 2015-08-14 DIAGNOSIS — I110 Hypertensive heart disease with heart failure: Secondary | ICD-10-CM | POA: Diagnosis not present

## 2015-08-14 DIAGNOSIS — Z992 Dependence on renal dialysis: Secondary | ICD-10-CM

## 2015-08-14 DIAGNOSIS — I159 Secondary hypertension, unspecified: Secondary | ICD-10-CM

## 2015-08-14 LAB — COMPREHENSIVE METABOLIC PANEL
ALBUMIN: 2.5 g/dL — AB (ref 3.5–5.0)
ALK PHOS: 411 U/L — AB (ref 38–126)
ALT: 79 U/L — ABNORMAL HIGH (ref 17–63)
ANION GAP: 10 (ref 5–15)
AST: 58 U/L — ABNORMAL HIGH (ref 15–41)
BILIRUBIN TOTAL: 2.5 mg/dL — AB (ref 0.3–1.2)
BUN: 26 mg/dL — ABNORMAL HIGH (ref 6–20)
CALCIUM: 8.4 mg/dL — AB (ref 8.9–10.3)
CO2: 27 mmol/L (ref 22–32)
Chloride: 95 mmol/L — ABNORMAL LOW (ref 101–111)
Creatinine, Ser: 5.03 mg/dL — ABNORMAL HIGH (ref 0.61–1.24)
GFR calc Af Amer: 13 mL/min — ABNORMAL LOW (ref >60)
GFR, EST NON AFRICAN AMERICAN: 11 mL/min — AB (ref >60)
GLUCOSE: 184 mg/dL — AB (ref 65–99)
Potassium: 3.6 mmol/L (ref 3.5–5.1)
Sodium: 132 mmol/L — ABNORMAL LOW (ref 135–145)
TOTAL PROTEIN: 5.8 g/dL — AB (ref 6.5–8.1)

## 2015-08-14 LAB — CBC WITH DIFFERENTIAL/PLATELET
BASOS PCT: 0 %
Basophils Absolute: 0 10*3/uL (ref 0–0.1)
Eosinophils Absolute: 0.2 10*3/uL (ref 0–0.7)
Eosinophils Relative: 2 %
HEMATOCRIT: 32.6 % — AB (ref 40.0–52.0)
HEMOGLOBIN: 10.9 g/dL — AB (ref 13.0–18.0)
LYMPHS ABS: 0.6 10*3/uL — AB (ref 1.0–3.6)
LYMPHS PCT: 7 %
MCH: 29.4 pg (ref 26.0–34.0)
MCHC: 33.4 g/dL (ref 32.0–36.0)
MCV: 88.1 fL (ref 80.0–100.0)
MONOS PCT: 11 %
Monocytes Absolute: 0.8 10*3/uL (ref 0.2–1.0)
NEUTROS ABS: 6.2 10*3/uL (ref 1.4–6.5)
NEUTROS PCT: 80 %
Platelets: 218 10*3/uL (ref 150–440)
RBC: 3.7 MIL/uL — ABNORMAL LOW (ref 4.40–5.90)
RDW: 16.6 % — ABNORMAL HIGH (ref 11.5–14.5)
WBC: 7.8 10*3/uL (ref 3.8–10.6)

## 2015-08-14 NOTE — Telephone Encounter (Signed)
Spoke to the physician. He is more agitated and confused. Delerium. I advised patient go to the emergency room. The patient does not want to go to the ED and so physician is going to get labs to evaluate for metabolic/toxic causes. Patient declines imaging. i agree with drawing labs and will let Dr. Anne HahnWillis know thanks

## 2015-08-14 NOTE — Telephone Encounter (Signed)
Daniel MeadEmma, please call patient and get a history of what kind of mental status changes he is having. Did anything happen, did he hit his head, start new medication, miss dialysis or any other inciting factors?

## 2015-08-14 NOTE — Telephone Encounter (Signed)
Dr Sullivan LoneGilbert [phone], saw pt who is having mental status changes. Pt had by pass surgery the end of October 2016 and on  dialysis. He needs advise. Please call.

## 2015-08-17 DEATH — deceased

## 2015-08-19 NOTE — Telephone Encounter (Signed)
I called Dr. Sullivan LoneGilbert. Apparently, Dr. Fayrene FearingJames passed away last week. He had a rapid deterioration in his medical condition.

## 2015-09-01 ENCOUNTER — Other Ambulatory Visit: Payer: Medicare Other

## 2015-09-02 ENCOUNTER — Other Ambulatory Visit: Payer: Medicare Other

## 2015-09-25 ENCOUNTER — Ambulatory Visit: Payer: Medicare Other | Admitting: Cardiovascular Disease

## 2015-09-25 ENCOUNTER — Ambulatory Visit: Payer: Medicare Other | Admitting: Internal Medicine

## 2015-12-02 ENCOUNTER — Ambulatory Visit: Payer: Medicare Other | Admitting: Neurology

## 2017-05-23 IMAGING — CR DG CHEST 1V
1 series · 1 of 1 positions shown · non-contrast
Comparison: 08/16/2013

CLINICAL DATA: Possible syncopal episode with bradycardia and brief
episode of unresponsiveness. Hyperglycemia.

EXAM:
CHEST 1 VIEW

[ap]
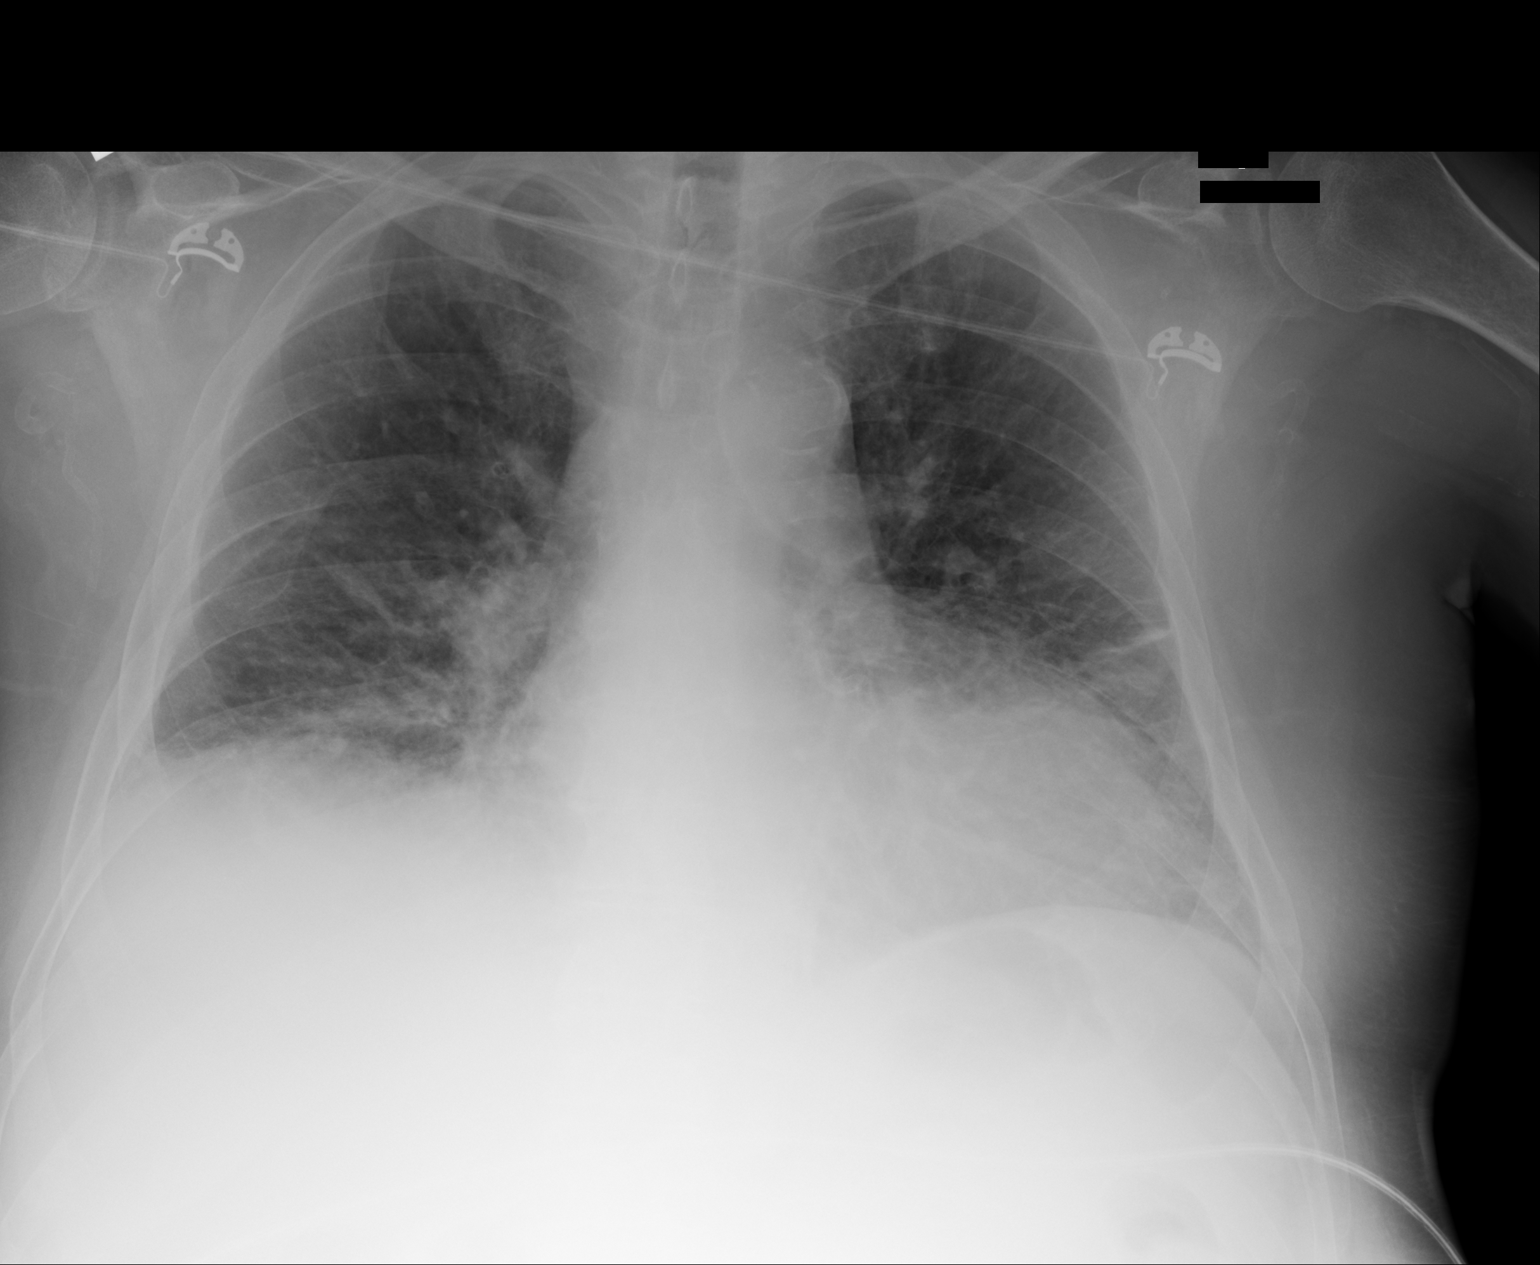

[1 of 1 positions shown; findings below may reference images not displayed]

FINDINGS: Shallow inspiration with atelectasis in the lung bases. Cardiac
enlargement with mild pulmonary vascular congestion, developing
since previous study. Mild perihilar infiltration suggesting early
edema. Small right pleural effusion. No pneumothorax. Calcified and
tortuous aorta.
IMPRESSION: Shallow inspiration with linear atelectasis in the mid and lower
lungs. Cardiac enlargement with mild pulmonary vascular congestion,
mild perihilar edema, and small right pleural effusion.

## 2017-05-28 IMAGING — CR DG CHEST 1V PORT
1 series · 1 of 1 positions shown · non-contrast
Comparison: Chest radiograph May 25, 2015; chest CT May 28, 2015

CLINICAL DATA: Status post coronary artery bypass grafting.
Hypoxia.

EXAM:
PORTABLE CHEST 1 VIEW

[AP]
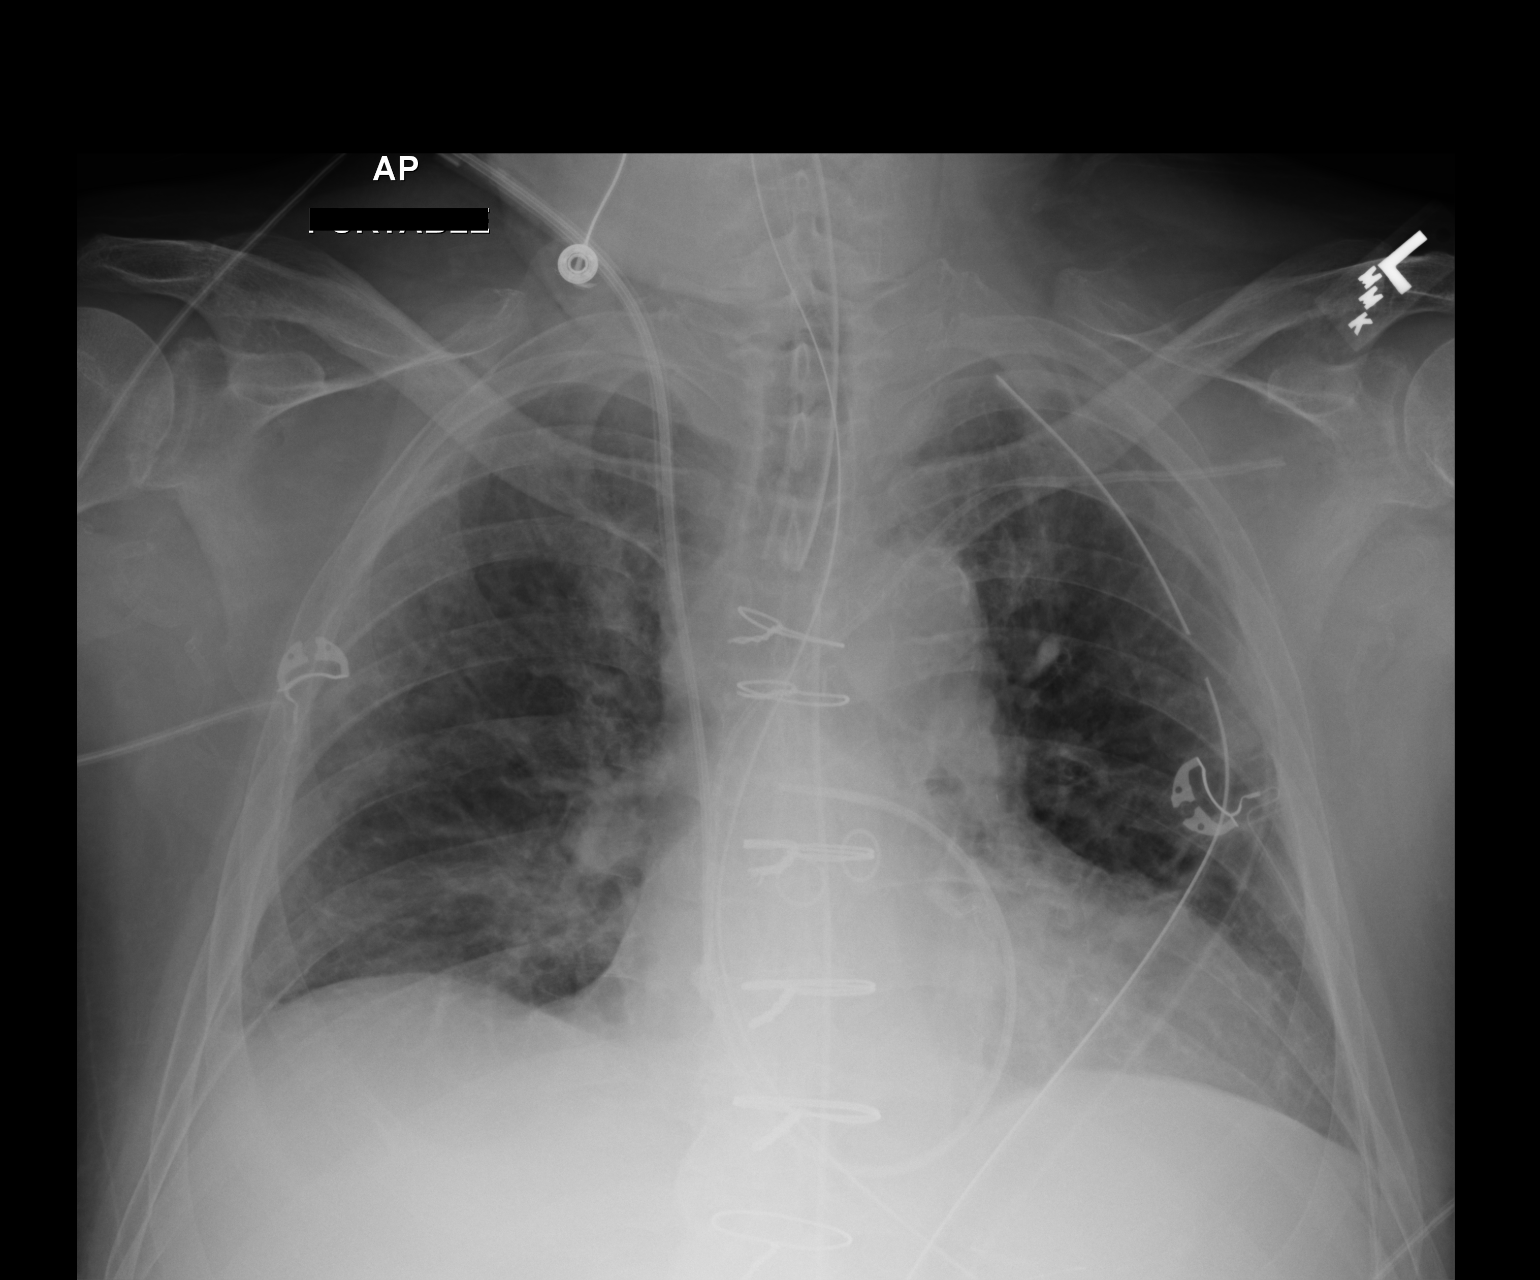

[1 of 1 positions shown; findings below may reference images not displayed]

FINDINGS: Endotracheal tube tip is 2.8 cm above the carina. Swan-Ganz catheter
tip is in the right main pulmonary artery. A second central catheter
is present with the tip in the right atrium. There is a left chest
tube and a mediastinal drain. Nasogastric tube tip and side port are
below the diaphragm. There is no demonstrable pneumothorax. There is
slight bibasilar atelectasis. Lungs elsewhere clear. There is a
minimal right pleural effusion. Heart is borderline enlarged with
pulmonary vascularity within normal limits. No adenopathy.
IMPRESSION: Tube and catheter positions as described without pneumothorax.
Slight bibasilar atelectasis. Minimal right effusion. Lungs
elsewhere clear. Heart prominent but stable.

## 2017-05-29 IMAGING — CR DG CHEST 1V PORT
1 series · 1 of 1 positions shown · non-contrast
Comparison: 05/30/2015

CLINICAL DATA: Status post CABG x3.

EXAM:
PORTABLE CHEST 1 VIEW

[AP]
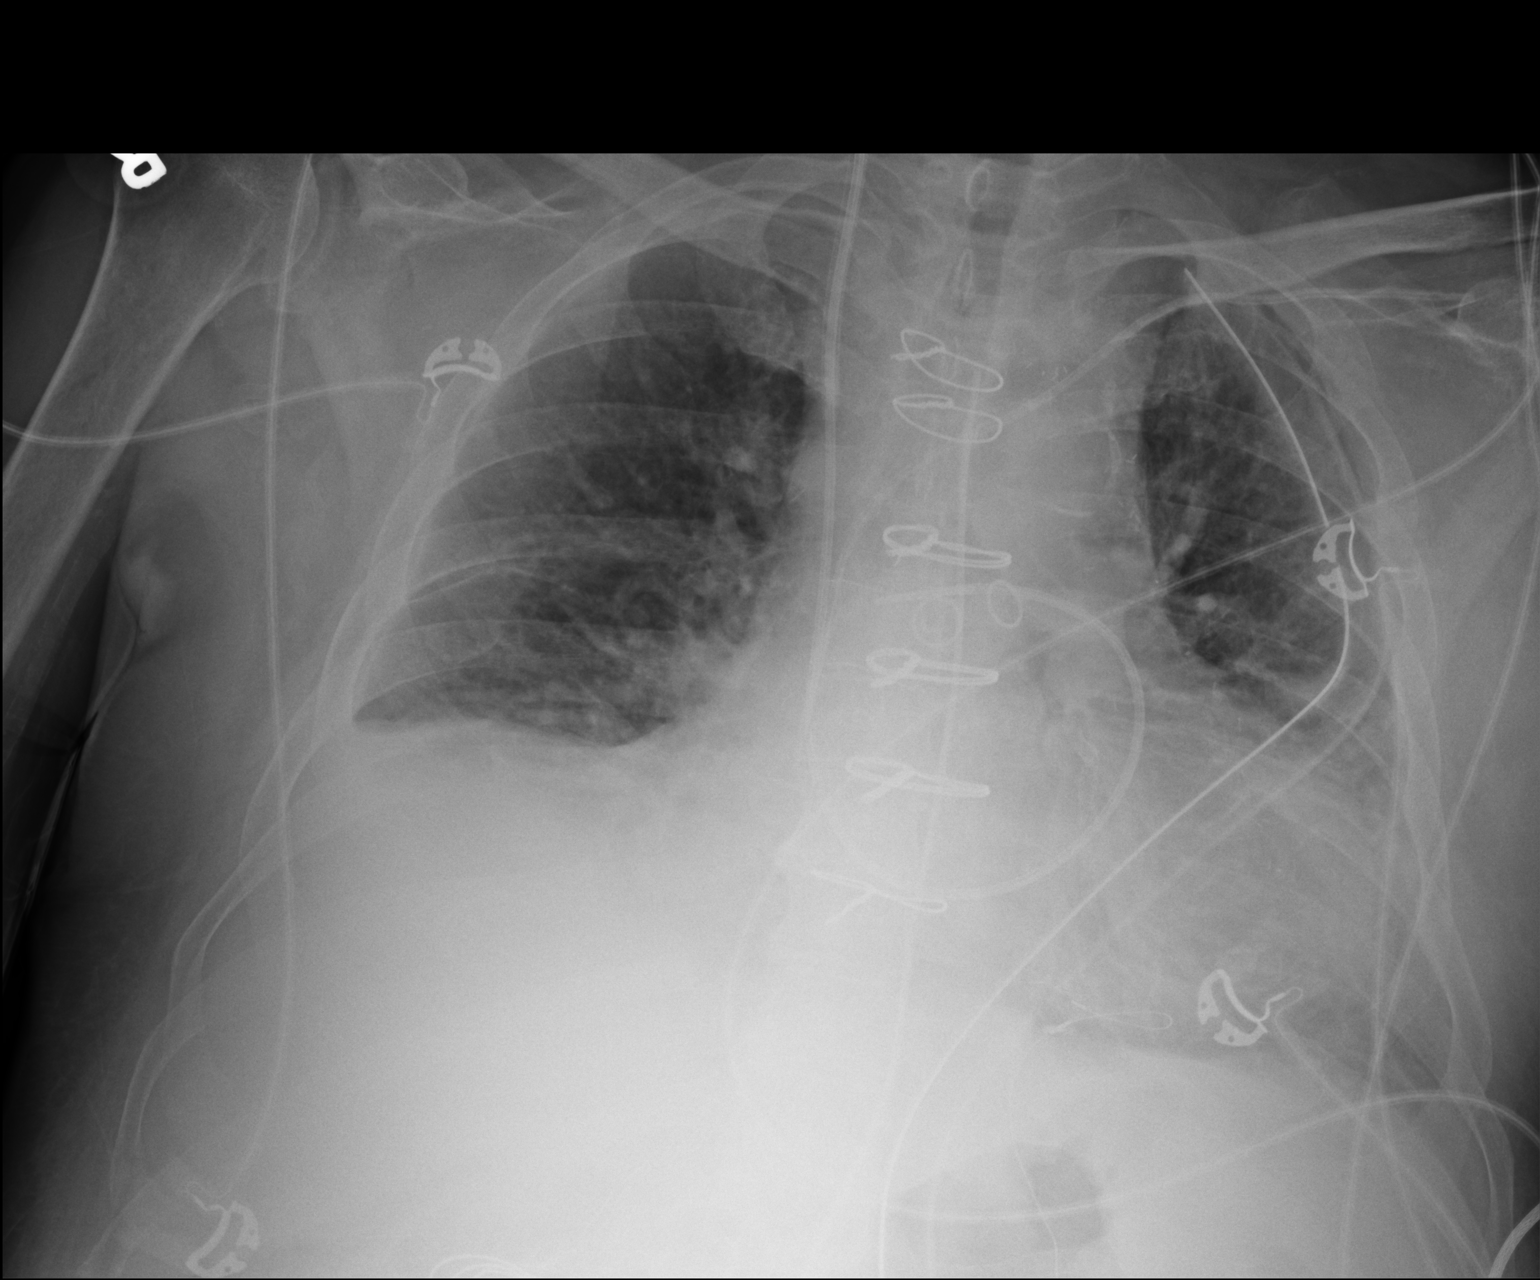

[1 of 1 positions shown; findings below may reference images not displayed]

FINDINGS: Right IJ Swan-Ganz catheter unchanged in position with tip at
proximal right pulmonary artery. Mediastinal drain. Left-sided
subclavian line tip not well visualized. Prior median sternotomy.
Left chest tube is unchanged. Cardiomegaly accentuated by AP
portable technique. Suspect trace right pleural fluid. No
pneumothorax. Mild pulmonary venous congestion, accentuated by low
lung volumes. Increasing right hemidiaphragm elevation with adjacent
atelectasis.
IMPRESSION: Cardiomegaly with developing mild pulmonary venous congestion.

Progressive right hemidiaphragm elevation with overlying atelectasis
and volume loss.

## 2017-06-30 IMAGING — CR DG CHEST 2V
2 series · 2 of 2 positions shown · non-contrast
Comparison: PA and lateral chest x-ray June 07, 2015

CLINICAL DATA: CABG on May 30, 2015, follow-up study, no
complaints today.

EXAM:
CHEST  2 VIEW

[w chest pa]
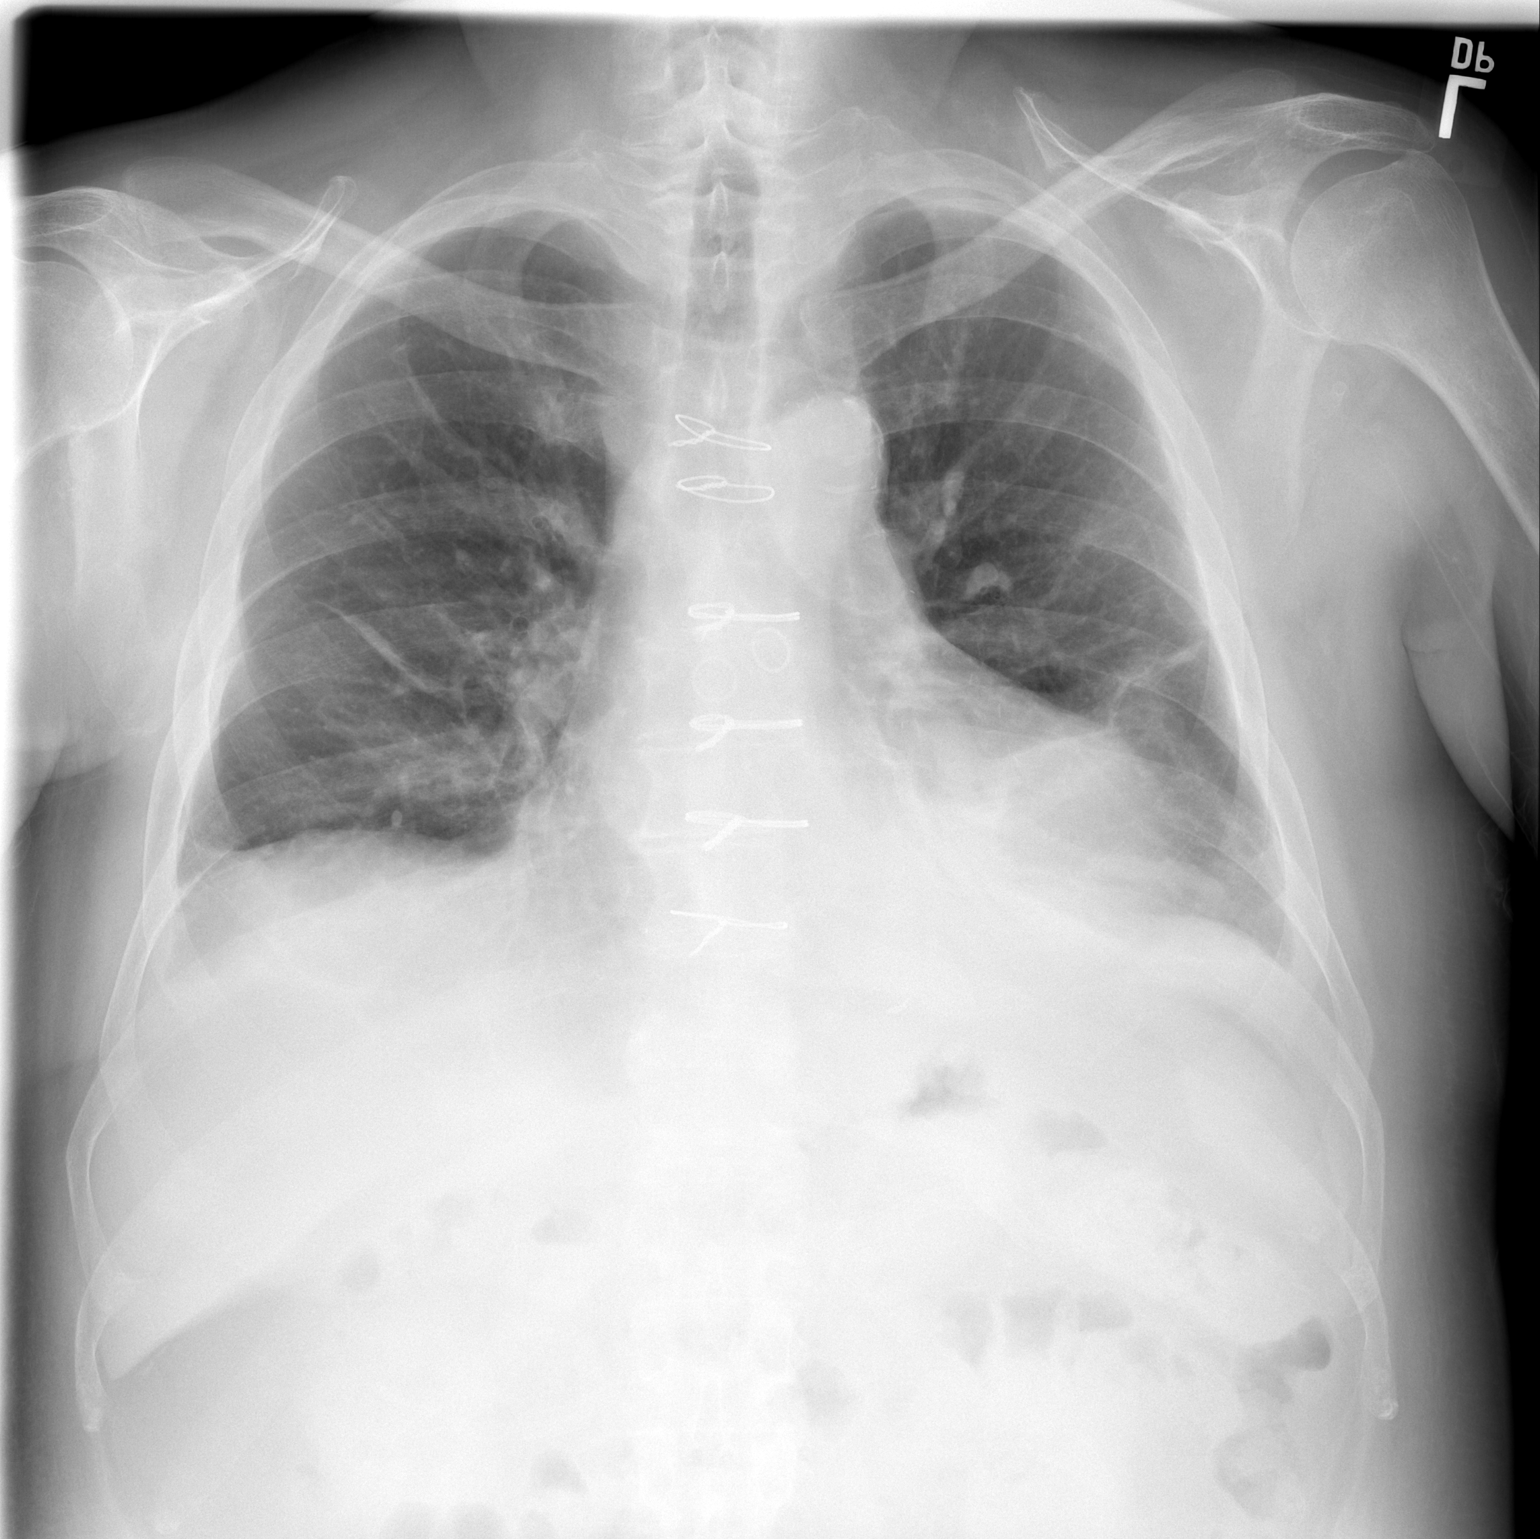

[w chest lat]
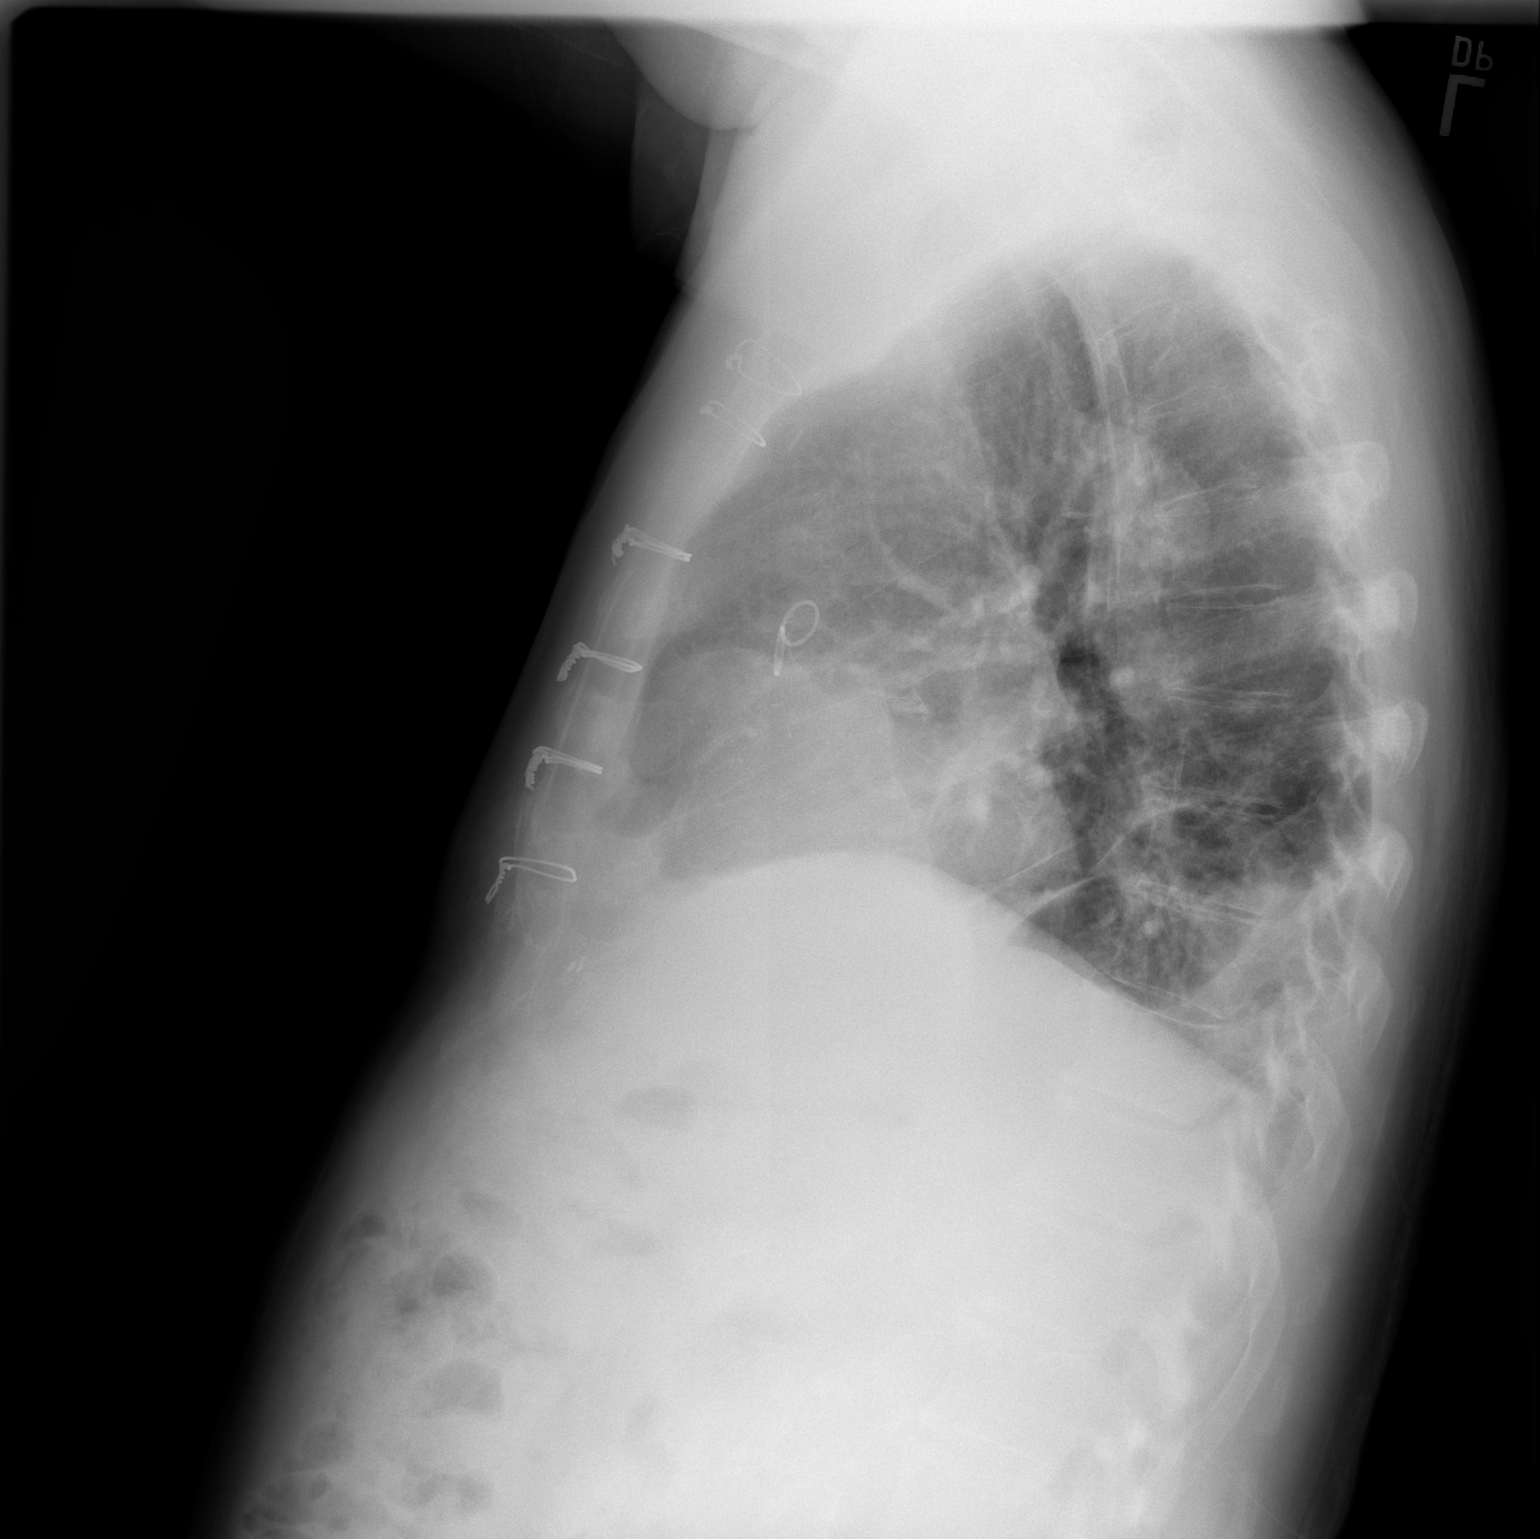

[2 of 2 positions shown; findings below may reference images not displayed]

FINDINGS: The lungs remain mildly hypoinflated. There is persistent left lower
lobe atelectasis. Subsegmental atelectasis on the right persists.
There is persistent small left pleural effusion which has decreased
in size. There is no pneumothorax. The cardiac silhouette is mildly
enlarged. The mediastinum is normal in width. There are 6 intact
sternal wires. The retrosternal soft tissues are unremarkable.
IMPRESSION: Interval decrease in the small left pleural effusion. Persistent
bibasilar subsegmental atelectasis. There is no pulmonary edema.
